# Patient Record
Sex: Female | Born: 1935 | Race: Black or African American | Hispanic: No | Smoking: Never smoker
Health system: Southern US, Community
[De-identification: ages and names within clinical notes are randomized; demographics above are authoritative.]

## PROBLEM LIST (undated history)

## (undated) DIAGNOSIS — F419 Anxiety disorder, unspecified: Secondary | ICD-10-CM

## (undated) DIAGNOSIS — M1990 Unspecified osteoarthritis, unspecified site: Secondary | ICD-10-CM

## (undated) DIAGNOSIS — I10 Essential (primary) hypertension: Secondary | ICD-10-CM

## (undated) DIAGNOSIS — R569 Unspecified convulsions: Secondary | ICD-10-CM

## (undated) DIAGNOSIS — N390 Urinary tract infection, site not specified: Secondary | ICD-10-CM

## (undated) DIAGNOSIS — R339 Retention of urine, unspecified: Secondary | ICD-10-CM

## (undated) DIAGNOSIS — S72059A Unspecified fracture of head of unspecified femur, initial encounter for closed fracture: Secondary | ICD-10-CM

## (undated) DIAGNOSIS — I96 Gangrene, not elsewhere classified: Secondary | ICD-10-CM

## (undated) HISTORY — PX: CATARACT EXTRACTION: SUR2

## (undated) HISTORY — PX: TOTAL HIP ARTHROPLASTY: SHX124

---

## 2001-09-12 ENCOUNTER — Emergency Department (HOSPITAL_COMMUNITY): Admission: EM | Admit: 2001-09-12 | Discharge: 2001-09-12 | Payer: Self-pay | Admitting: Emergency Medicine

## 2001-09-12 ENCOUNTER — Encounter: Payer: Self-pay | Admitting: Emergency Medicine

## 2001-09-13 ENCOUNTER — Encounter: Payer: Self-pay | Admitting: Emergency Medicine

## 2001-09-13 ENCOUNTER — Ambulatory Visit (HOSPITAL_COMMUNITY): Admission: RE | Admit: 2001-09-13 | Discharge: 2001-09-13 | Payer: Self-pay | Admitting: Emergency Medicine

## 2001-09-22 ENCOUNTER — Encounter: Payer: Self-pay | Admitting: Nephrology

## 2001-09-22 ENCOUNTER — Encounter: Admission: RE | Admit: 2001-09-22 | Discharge: 2001-09-22 | Payer: Self-pay | Admitting: Nephrology

## 2002-10-07 ENCOUNTER — Emergency Department (HOSPITAL_COMMUNITY): Admission: EM | Admit: 2002-10-07 | Discharge: 2002-10-07 | Payer: Self-pay

## 2002-12-17 ENCOUNTER — Encounter: Payer: Self-pay | Admitting: Emergency Medicine

## 2002-12-17 ENCOUNTER — Emergency Department (HOSPITAL_COMMUNITY): Admission: EM | Admit: 2002-12-17 | Discharge: 2002-12-17 | Payer: Self-pay | Admitting: Emergency Medicine

## 2003-01-23 ENCOUNTER — Encounter: Admission: RE | Admit: 2003-01-23 | Discharge: 2003-02-07 | Payer: Self-pay | Admitting: Family Medicine

## 2003-02-01 ENCOUNTER — Emergency Department (HOSPITAL_COMMUNITY): Admission: EM | Admit: 2003-02-01 | Discharge: 2003-02-01 | Payer: Self-pay | Admitting: Emergency Medicine

## 2003-02-01 ENCOUNTER — Encounter: Payer: Self-pay | Admitting: Emergency Medicine

## 2003-04-05 ENCOUNTER — Encounter: Payer: Self-pay | Admitting: Emergency Medicine

## 2003-04-05 ENCOUNTER — Emergency Department (HOSPITAL_COMMUNITY): Admission: EM | Admit: 2003-04-05 | Discharge: 2003-04-05 | Payer: Self-pay | Admitting: Emergency Medicine

## 2003-06-08 ENCOUNTER — Encounter: Payer: Self-pay | Admitting: Orthopedic Surgery

## 2003-06-08 ENCOUNTER — Ambulatory Visit (HOSPITAL_COMMUNITY): Admission: RE | Admit: 2003-06-08 | Discharge: 2003-06-08 | Payer: Self-pay | Admitting: Orthopedic Surgery

## 2005-09-28 ENCOUNTER — Emergency Department (HOSPITAL_COMMUNITY): Admission: EM | Admit: 2005-09-28 | Discharge: 2005-09-28 | Payer: Self-pay | Admitting: Emergency Medicine

## 2007-05-10 ENCOUNTER — Emergency Department (HOSPITAL_COMMUNITY): Admission: EM | Admit: 2007-05-10 | Discharge: 2007-05-10 | Payer: Self-pay | Admitting: Emergency Medicine

## 2007-08-23 ENCOUNTER — Emergency Department (HOSPITAL_COMMUNITY): Admission: EM | Admit: 2007-08-23 | Discharge: 2007-08-23 | Payer: Self-pay | Admitting: Emergency Medicine

## 2009-12-31 ENCOUNTER — Inpatient Hospital Stay (HOSPITAL_COMMUNITY): Admission: EM | Admit: 2009-12-31 | Discharge: 2010-01-04 | Payer: Self-pay | Admitting: Emergency Medicine

## 2011-01-25 LAB — POCT I-STAT, CHEM 8
BUN: 7 mg/dL (ref 6–23)
Creatinine, Ser: 0.7 mg/dL (ref 0.4–1.2)
Potassium: 3.2 mEq/L — ABNORMAL LOW (ref 3.5–5.1)

## 2011-01-25 LAB — CBC
HCT: 32.5 % — ABNORMAL LOW (ref 36.0–46.0)
HCT: 35.3 % — ABNORMAL LOW (ref 36.0–46.0)
Hemoglobin: 11 g/dL — ABNORMAL LOW (ref 12.0–15.0)
Hemoglobin: 12.1 g/dL (ref 12.0–15.0)
Hemoglobin: 12.5 g/dL (ref 12.0–15.0)
MCHC: 33.7 g/dL (ref 30.0–36.0)
MCHC: 33.8 g/dL (ref 30.0–36.0)
MCHC: 34.2 g/dL (ref 30.0–36.0)
MCV: 91.9 fL (ref 78.0–100.0)
MCV: 92.3 fL (ref 78.0–100.0)
Platelets: 210 10*3/uL (ref 150–400)
Platelets: 220 K/uL (ref 150–400)
Platelets: 252 10*3/uL (ref 150–400)
RBC: 3.47 MIL/uL — ABNORMAL LOW (ref 3.87–5.11)
RBC: 3.84 MIL/uL — ABNORMAL LOW (ref 3.87–5.11)
RBC: 3.98 MIL/uL (ref 3.87–5.11)
RDW: 13.9 % (ref 11.5–15.5)
RDW: 14 % (ref 11.5–15.5)
WBC: 11.2 10*3/uL — ABNORMAL HIGH (ref 4.0–10.5)
WBC: 12.2 10*3/uL — ABNORMAL HIGH (ref 4.0–10.5)
WBC: 16.2 K/uL — ABNORMAL HIGH (ref 4.0–10.5)

## 2011-01-25 LAB — BASIC METABOLIC PANEL
BUN: 4 mg/dL — ABNORMAL LOW (ref 6–23)
BUN: 7 mg/dL (ref 6–23)
CO2: 28 mEq/L (ref 19–32)
Calcium: 8.4 mg/dL (ref 8.4–10.5)
Calcium: 8.7 mg/dL (ref 8.4–10.5)
Chloride: 100 mEq/L (ref 96–112)
Chloride: 101 mEq/L (ref 96–112)
Creatinine, Ser: 0.59 mg/dL (ref 0.4–1.2)
GFR calc Af Amer: >60 mL/min (ref >60)
GFR calc non Af Amer: >60 mL/min (ref >60)
GFR calc non Af Amer: >60 mL/min (ref >60)
Glucose, Bld: 96 mg/dL (ref 70–99)
Potassium: 3.9 mEq/L (ref 3.5–5.1)
Potassium: 4.1 mEq/L (ref 3.5–5.1)

## 2011-01-25 LAB — DIFFERENTIAL
Basophils Absolute: 0 K/uL (ref 0.0–0.1)
Basophils Absolute: 0.1 10*3/uL (ref 0.0–0.1)
Basophils Absolute: 0.1 10*3/uL (ref 0.0–0.1)
Basophils Absolute: 0.1 K/uL (ref 0.0–0.1)
Basophils Relative: 0 % (ref 0–1)
Basophils Relative: 0 % (ref 0–1)
Basophils Relative: 0 % (ref 0–1)
Eosinophils Absolute: 0 10*3/uL (ref 0.0–0.7)
Eosinophils Absolute: 0 K/uL (ref 0.0–0.7)
Eosinophils Absolute: 0.1 K/uL (ref 0.0–0.7)
Eosinophils Relative: 0 % (ref 0–5)
Eosinophils Relative: 0 % (ref 0–5)
Eosinophils Relative: 0 % (ref 0–5)
Lymphocytes Relative: 13 % (ref 12–46)
Lymphocytes Relative: 14 % (ref 12–46)
Lymphocytes Relative: 21 % (ref 12–46)
Lymphocytes Relative: 4 % — ABNORMAL LOW (ref 12–46)
Lymphs Abs: 0.6 K/uL — ABNORMAL LOW (ref 0.7–4.0)
Lymphs Abs: 2 K/uL (ref 0.7–4.0)
Lymphs Abs: 2.5 10*3/uL (ref 0.7–4.0)
Monocytes Absolute: 1.2 K/uL — ABNORMAL HIGH (ref 0.1–1.0)
Monocytes Absolute: 1.2 K/uL — ABNORMAL HIGH (ref 0.1–1.0)
Monocytes Absolute: 1.4 10*3/uL — ABNORMAL HIGH (ref 0.1–1.0)
Monocytes Relative: 7 % (ref 3–12)
Monocytes Relative: 7 % (ref 3–12)
Monocytes Relative: 8 % (ref 3–12)
Neutro Abs: 12.2 K/uL — ABNORMAL HIGH (ref 1.7–7.7)
Neutro Abs: 14.4 K/uL — ABNORMAL HIGH (ref 1.7–7.7)
Neutro Abs: 8.2 10*3/uL — ABNORMAL HIGH (ref 1.7–7.7)
Neutro Abs: 8.7 10*3/uL — ABNORMAL HIGH (ref 1.7–7.7)
Neutrophils Relative %: 68 % (ref 43–77)
Neutrophils Relative %: 78 % — ABNORMAL HIGH (ref 43–77)
Neutrophils Relative %: 79 % — ABNORMAL HIGH (ref 43–77)
Neutrophils Relative %: 89 % — ABNORMAL HIGH (ref 43–77)

## 2011-01-25 LAB — RETICULOCYTES
RBC.: 3.97 MIL/uL (ref 3.87–5.11)
Retic Count, Absolute: 23.8 K/uL (ref 19.0–186.0)
Retic Ct Pct: 0.6 % (ref 0.4–3.1)

## 2011-01-25 LAB — WOUND CULTURE

## 2011-01-25 LAB — IRON AND TIBC
Iron: 32 ug/dL — ABNORMAL LOW (ref 42–135)
Saturation Ratios: 24 % (ref 20–55)
TIBC: 134 ug/dL — ABNORMAL LOW (ref 250–470)
UIBC: 102 ug/dL

## 2011-01-25 LAB — RAPID STREP SCREEN (MED CTR MEBANE ONLY): Streptococcus, Group A Screen (Direct): NEGATIVE

## 2011-01-25 LAB — BASIC METABOLIC PANEL WITH GFR
BUN: 6 mg/dL (ref 6–23)
CO2: 33 meq/L — ABNORMAL HIGH (ref 19–32)
Calcium: 8 mg/dL — ABNORMAL LOW (ref 8.4–10.5)
Chloride: 97 meq/L (ref 96–112)
Creatinine, Ser: 0.55 mg/dL (ref 0.4–1.2)
GFR calc non Af Amer: >60 mL/min
Glucose, Bld: 100 mg/dL — ABNORMAL HIGH (ref 70–99)
Potassium: 2.9 meq/L — ABNORMAL LOW (ref 3.5–5.1)
Sodium: 134 meq/L — ABNORMAL LOW (ref 135–145)

## 2011-01-25 LAB — MAGNESIUM
Magnesium: 1.8 mg/dL (ref 1.5–2.5)
Magnesium: 2.4 mg/dL (ref 1.5–2.5)

## 2011-01-25 LAB — FOLATE: Folate: 17.6 ng/mL

## 2011-01-25 LAB — VITAMIN B12: Vitamin B-12: 1611 pg/mL — ABNORMAL HIGH (ref 211–911)

## 2011-01-25 LAB — FERRITIN: Ferritin: 436 ng/mL — ABNORMAL HIGH (ref 10–291)

## 2011-01-25 LAB — MRSA PCR SCREENING: MRSA by PCR: NEGATIVE

## 2011-08-12 LAB — BASIC METABOLIC PANEL
BUN: 4 — ABNORMAL LOW
Chloride: 95 — ABNORMAL LOW
Glucose, Bld: 92
Potassium: 3.8
Sodium: 137

## 2011-08-12 LAB — URINALYSIS, ROUTINE W REFLEX MICROSCOPIC
Nitrite: NEGATIVE
Specific Gravity, Urine: 1.006
pH: 7.5

## 2011-08-12 LAB — URINE MICROSCOPIC-ADD ON

## 2011-08-12 LAB — URINE CULTURE

## 2011-08-18 LAB — DIFFERENTIAL
Basophils Absolute: 0
Basophils Relative: 1
Eosinophils Absolute: 0
Eosinophils Relative: 0
Monocytes Absolute: 0.3

## 2011-08-18 LAB — COMPREHENSIVE METABOLIC PANEL
ALT: 19
AST: 41 — ABNORMAL HIGH
Albumin: 3.3 — ABNORMAL LOW
Alkaline Phosphatase: 89
CO2: 30
Chloride: 100
Creatinine, Ser: 0.56
GFR calc Af Amer: >60
GFR calc non Af Amer: >60
Potassium: 4.5
Total Bilirubin: 1

## 2011-08-18 LAB — URINE MICROSCOPIC-ADD ON

## 2011-08-18 LAB — URINALYSIS, ROUTINE W REFLEX MICROSCOPIC
Bilirubin Urine: NEGATIVE
Glucose, UA: NEGATIVE
Ketones, ur: NEGATIVE
Leukocytes, UA: NEGATIVE
Specific Gravity, Urine: 1.008
pH: 7.5

## 2011-08-18 LAB — CBC
MCV: 89.8
RBC: 3.69 — ABNORMAL LOW
WBC: 4.4

## 2011-08-18 LAB — PHENYTOIN LEVEL, TOTAL: Phenytoin Lvl: <2.5 — ABNORMAL LOW

## 2014-04-06 DIAGNOSIS — Z5181 Encounter for therapeutic drug level monitoring: Secondary | ICD-10-CM | POA: Diagnosis not present

## 2014-04-06 DIAGNOSIS — I10 Essential (primary) hypertension: Secondary | ICD-10-CM | POA: Diagnosis not present

## 2014-07-27 DIAGNOSIS — I10 Essential (primary) hypertension: Secondary | ICD-10-CM | POA: Diagnosis not present

## 2014-07-27 DIAGNOSIS — Z8669 Personal history of other diseases of the nervous system and sense organs: Secondary | ICD-10-CM | POA: Diagnosis not present

## 2014-07-27 DIAGNOSIS — G309 Alzheimer's disease, unspecified: Secondary | ICD-10-CM | POA: Diagnosis not present

## 2014-07-27 DIAGNOSIS — F0280 Dementia in other diseases classified elsewhere without behavioral disturbance: Secondary | ICD-10-CM | POA: Diagnosis not present

## 2014-10-05 DIAGNOSIS — E871 Hypo-osmolality and hyponatremia: Secondary | ICD-10-CM | POA: Diagnosis not present

## 2014-11-06 DIAGNOSIS — F419 Anxiety disorder, unspecified: Secondary | ICD-10-CM | POA: Diagnosis not present

## 2014-11-06 DIAGNOSIS — K219 Gastro-esophageal reflux disease without esophagitis: Secondary | ICD-10-CM | POA: Diagnosis not present

## 2014-11-06 DIAGNOSIS — I10 Essential (primary) hypertension: Secondary | ICD-10-CM | POA: Diagnosis not present

## 2014-11-06 DIAGNOSIS — M1990 Unspecified osteoarthritis, unspecified site: Secondary | ICD-10-CM | POA: Diagnosis not present

## 2014-11-06 DIAGNOSIS — G40909 Epilepsy, unspecified, not intractable, without status epilepticus: Secondary | ICD-10-CM | POA: Diagnosis not present

## 2015-02-04 DIAGNOSIS — G40909 Epilepsy, unspecified, not intractable, without status epilepticus: Secondary | ICD-10-CM | POA: Diagnosis not present

## 2015-02-04 DIAGNOSIS — I10 Essential (primary) hypertension: Secondary | ICD-10-CM | POA: Diagnosis not present

## 2015-02-04 DIAGNOSIS — N63 Unspecified lump in breast: Secondary | ICD-10-CM | POA: Diagnosis not present

## 2015-02-04 DIAGNOSIS — R413 Other amnesia: Secondary | ICD-10-CM | POA: Diagnosis not present

## 2015-02-04 DIAGNOSIS — F419 Anxiety disorder, unspecified: Secondary | ICD-10-CM | POA: Diagnosis not present

## 2015-02-04 DIAGNOSIS — R2689 Other abnormalities of gait and mobility: Secondary | ICD-10-CM | POA: Diagnosis not present

## 2015-02-08 ENCOUNTER — Other Ambulatory Visit: Payer: Self-pay | Admitting: Family Medicine

## 2015-02-08 DIAGNOSIS — N63 Unspecified lump in unspecified breast: Secondary | ICD-10-CM

## 2015-05-28 DIAGNOSIS — F419 Anxiety disorder, unspecified: Secondary | ICD-10-CM | POA: Diagnosis not present

## 2015-05-28 DIAGNOSIS — M1990 Unspecified osteoarthritis, unspecified site: Secondary | ICD-10-CM | POA: Diagnosis not present

## 2015-05-28 DIAGNOSIS — I10 Essential (primary) hypertension: Secondary | ICD-10-CM | POA: Diagnosis not present

## 2015-05-28 DIAGNOSIS — K219 Gastro-esophageal reflux disease without esophagitis: Secondary | ICD-10-CM | POA: Diagnosis not present

## 2015-05-28 DIAGNOSIS — G40909 Epilepsy, unspecified, not intractable, without status epilepticus: Secondary | ICD-10-CM | POA: Diagnosis not present

## 2015-10-23 DIAGNOSIS — F411 Generalized anxiety disorder: Secondary | ICD-10-CM | POA: Diagnosis not present

## 2015-10-23 DIAGNOSIS — L03019 Cellulitis of unspecified finger: Secondary | ICD-10-CM | POA: Diagnosis not present

## 2015-10-23 DIAGNOSIS — H6120 Impacted cerumen, unspecified ear: Secondary | ICD-10-CM | POA: Diagnosis not present

## 2015-10-23 DIAGNOSIS — L603 Nail dystrophy: Secondary | ICD-10-CM | POA: Diagnosis not present

## 2015-11-06 ENCOUNTER — Ambulatory Visit
Admission: RE | Admit: 2015-11-06 | Discharge: 2015-11-06 | Disposition: A | Discharge status: Home / Self Care | Payer: Medicare Other | Source: Ambulatory Visit | Attending: Physician Assistant | Admitting: Physician Assistant

## 2015-11-06 ENCOUNTER — Other Ambulatory Visit: Payer: Self-pay | Admitting: Physician Assistant

## 2015-11-06 DIAGNOSIS — W19XXXA Unspecified fall, initial encounter: Secondary | ICD-10-CM

## 2015-11-06 DIAGNOSIS — R0781 Pleurodynia: Secondary | ICD-10-CM | POA: Diagnosis not present

## 2016-03-29 DIAGNOSIS — L03012 Cellulitis of left finger: Secondary | ICD-10-CM | POA: Diagnosis not present

## 2016-04-09 DIAGNOSIS — M79645 Pain in left finger(s): Secondary | ICD-10-CM | POA: Diagnosis not present

## 2016-04-09 DIAGNOSIS — Z131 Encounter for screening for diabetes mellitus: Secondary | ICD-10-CM | POA: Diagnosis not present

## 2016-04-09 DIAGNOSIS — L03012 Cellulitis of left finger: Secondary | ICD-10-CM | POA: Diagnosis not present

## 2016-09-18 DIAGNOSIS — I10 Essential (primary) hypertension: Secondary | ICD-10-CM | POA: Diagnosis not present

## 2016-09-18 DIAGNOSIS — K219 Gastro-esophageal reflux disease without esophagitis: Secondary | ICD-10-CM | POA: Diagnosis not present

## 2016-09-18 DIAGNOSIS — M79675 Pain in left toe(s): Secondary | ICD-10-CM | POA: Diagnosis not present

## 2016-09-18 DIAGNOSIS — G40909 Epilepsy, unspecified, not intractable, without status epilepticus: Secondary | ICD-10-CM | POA: Diagnosis not present

## 2016-10-14 ENCOUNTER — Emergency Department (HOSPITAL_BASED_OUTPATIENT_CLINIC_OR_DEPARTMENT_OTHER): Payer: Medicare Other

## 2016-10-14 ENCOUNTER — Emergency Department (HOSPITAL_BASED_OUTPATIENT_CLINIC_OR_DEPARTMENT_OTHER)
Admission: EM | Admit: 2016-10-14 | Discharge: 2016-10-14 | Disposition: A | Discharge status: Home / Self Care | Payer: Medicare Other | Attending: Emergency Medicine | Admitting: Emergency Medicine

## 2016-10-14 ENCOUNTER — Encounter (HOSPITAL_BASED_OUTPATIENT_CLINIC_OR_DEPARTMENT_OTHER): Payer: Self-pay | Admitting: Emergency Medicine

## 2016-10-14 DIAGNOSIS — Y929 Unspecified place or not applicable: Secondary | ICD-10-CM | POA: Diagnosis not present

## 2016-10-14 DIAGNOSIS — W1839XA Other fall on same level, initial encounter: Secondary | ICD-10-CM | POA: Insufficient documentation

## 2016-10-14 DIAGNOSIS — M25552 Pain in left hip: Secondary | ICD-10-CM | POA: Diagnosis not present

## 2016-10-14 DIAGNOSIS — Y939 Activity, unspecified: Secondary | ICD-10-CM | POA: Diagnosis not present

## 2016-10-14 DIAGNOSIS — S32502A Unspecified fracture of left pubis, initial encounter for closed fracture: Secondary | ICD-10-CM | POA: Diagnosis not present

## 2016-10-14 DIAGNOSIS — S3993XA Unspecified injury of pelvis, initial encounter: Secondary | ICD-10-CM | POA: Diagnosis present

## 2016-10-14 DIAGNOSIS — Y999 Unspecified external cause status: Secondary | ICD-10-CM | POA: Insufficient documentation

## 2016-10-14 DIAGNOSIS — S32592A Other specified fracture of left pubis, initial encounter for closed fracture: Secondary | ICD-10-CM | POA: Insufficient documentation

## 2016-10-14 DIAGNOSIS — Y92009 Unspecified place in unspecified non-institutional (private) residence as the place of occurrence of the external cause: Secondary | ICD-10-CM

## 2016-10-14 DIAGNOSIS — I10 Essential (primary) hypertension: Secondary | ICD-10-CM | POA: Insufficient documentation

## 2016-10-14 DIAGNOSIS — Z79899 Other long term (current) drug therapy: Secondary | ICD-10-CM | POA: Diagnosis not present

## 2016-10-14 DIAGNOSIS — W19XXXA Unspecified fall, initial encounter: Secondary | ICD-10-CM

## 2016-10-14 HISTORY — DX: Unspecified osteoarthritis, unspecified site: M19.90

## 2016-10-14 HISTORY — DX: Unspecified convulsions: R56.9

## 2016-10-14 HISTORY — DX: Essential (primary) hypertension: I10

## 2016-10-14 MED ORDER — ACETAMINOPHEN 500 MG PO TABS
1000.0000 mg | ORAL_TABLET | Freq: Once | ORAL | Status: AC
  Administered 2016-10-14: 1000 mg via ORAL
  Filled 2016-10-14: qty 2

## 2016-10-14 MED ORDER — OXYCODONE HCL 5 MG PO TABS
5.0000 mg | ORAL_TABLET | Freq: Once | ORAL | Status: AC
Start: 2016-10-14 — End: 2016-10-14
  Administered 2016-10-14: 5 mg via ORAL
  Filled 2016-10-14: qty 1

## 2016-10-14 NOTE — Discharge Instructions (Addendum)
Follow-up with your podiatrist in your palliative care physician. They may want to do a vascular study of your leg if your symptoms persist. The podiatrist may want start you on antifungal medicine.  Weight bearing as tolerated.

## 2016-10-14 NOTE — ED Notes (Signed)
Family attempting to obtain medication list.

## 2016-10-14 NOTE — ED Triage Notes (Signed)
Nurse first-pt stood with family member assist from back seat of car-pivoted to w/c-taken to ED WR in w/c-NAD

## 2016-10-14 NOTE — ED Triage Notes (Addendum)
Patient accompanied by her daughter Avon Gully), and her niece. Hospice Palliative Care is involved in patient care. Patient family was requested to bring patient in for imaging to the left hip, leg due to a fall on Friday. Patient was walking with a walker when she fell when noone was home. Patient then crawled to another part of the house. Family reports the patient has bruising scattered to her body. Family reports patient is not oriented at her baseline, has been altered for @ 2 months after starting pain medications.

## 2016-10-14 NOTE — ED Provider Notes (Signed)
[facility] Provider Note   CSN: [id number] Arrival date & time: 10/14/16  1619     History   Chief Complaint Chief Complaint  Patient presents with  . Fall    HPI Kayla Collier is a 82 y.o. female.  82 yo F in hospice and palliative care had a fall couple days ago. Sounds mechanical in nature. Has been having more and more falls secondary to was been diagnosed as peripheral neuropathy. Patient has been complaining of pain to bilateral feet and now having difficulty stepping on them due to pain. Has recently been started on gabapentin. Family is concerned that maybe she broke her hip she's been having pain to her left lateral leg. Denies any other injury. Denies back pain chest pain abdominal pain pelvic pain. Has been able to ambulate but with pain.   The history is provided by the patient.  Fall  This is a new problem. The current episode started 2 days ago. The problem occurs constantly. The problem has not changed since onset.Pertinent negatives include no chest pain, no headaches and no shortness of breath. Nothing aggravates the symptoms. Nothing relieves the symptoms. She has tried nothing for the symptoms. The treatment provided no relief.    Past Medical History:  Diagnosis Date  . Arthritis    RA  . Hypertension   . Seizures (HCC)     There are no active problems to display for this patient.   Past Surgical History:  Procedure Laterality Date  . CATARACT EXTRACTION    . TOTAL HIP ARTHROPLASTY      OB History    No data available       Home Medications    Prior to Admission medications   Medication Sig Start Date End Date Taking? Authorizing Provider  ALPRAZolam Prudy Feeler) 0.5 MG tablet Take 0.5 mg by mouth at bedtime as needed for anxiety.   Yes Historical Provider, MD  amLODipine (NORVASC) 5 MG tablet Take 5 mg by mouth daily.   Yes Historical Provider, MD  atenolol (TENORMIN) 50 MG tablet Take 50 mg by mouth daily.   Yes Historical  Provider, MD  cefadroxil (DURICEF) 500 MG capsule Take 500 mg by mouth 2 (two) times daily.   Yes Historical Provider, MD  dipyridamole (PERSANTINE) 75 MG tablet Take 75 mg by mouth 4 (four) times daily.   Yes Historical Provider, MD  gabapentin (NEURONTIN) 100 MG capsule Take 100 mg by mouth 3 (three) times daily.   Yes Historical Provider, MD  oxyCODONE-acetaminophen (PERCOCET) 10-325 MG tablet Take 1 tablet by mouth every 4 (four) hours as needed for pain.   Yes Historical Provider, MD  phenytoin (DILANTIN) 100 MG ER capsule Take by mouth 3 (three) times daily.   Yes Historical Provider, MD  phenytoin (DILANTIN) 30 MG ER capsule Take by mouth 3 (three) times daily.   Yes Historical Provider, MD  potassium chloride (MICRO-K) 10 MEQ CR capsule Take 10 mEq by mouth 2 (two) times daily.   Yes Historical Provider, MD  traMADol (ULTRAM) 50 MG tablet Take by mouth every 6 (six) hours as needed.   Yes Historical Provider, MD  valsartan-hydrochlorothiazide (DIOVAN-HCT) 160-12.5 MG tablet Take 1 tablet by mouth daily.   Yes Historical Provider, MD    Family History History reviewed. No pertinent family history.  Social History Social History  Substance Use Topics  . Smoking status: Never Smoker  . Smokeless tobacco: Never Used  . Alcohol use No     Allergies  Vicodin [hydrocodone-acetaminophen]   Review of Systems Review of Systems  Constitutional: Negative for chills and fever.  HENT: Negative for congestion and rhinorrhea.   Eyes: Negative for redness and visual disturbance.  Respiratory: Negative for shortness of breath and wheezing.   Cardiovascular: Negative for chest pain and palpitations.  Gastrointestinal: Negative for nausea and vomiting.  Genitourinary: Negative for dysuria and urgency.  Musculoskeletal: Positive for arthralgias, gait problem and myalgias.  Skin: Negative for pallor and wound.  Neurological: Negative for dizziness and headaches.     Physical  Exam Updated Vital Signs BP 191/74 (BP Location: Left Arm)   Pulse 115   Temp 98.9 F (37.2 C)   Resp 16   Ht 4\' 9"  (1.448 m) Comment: pt daughter stated  Wt 94 lb (42.6 kg) Comment: per daughter at bedside  SpO2 96%   BMI 20.34 kg/m   Physical Exam  Constitutional: She is oriented to person, place, and time. She appears well-developed. No distress.  Cachectic   HENT:  Head: Normocephalic and atraumatic.  Eyes: EOM are normal. Pupils are equal, round, and reactive to light.  Neck: Normal range of motion. Neck supple.  Cardiovascular: Normal rate and regular rhythm.  Exam reveals no gallop and no friction rub.   No murmur heard. Pulmonary/Chest: Effort normal. She has no wheezes. She has no rales.  Abdominal: Soft. She exhibits no distension. There is no tenderness.  Musculoskeletal: She exhibits tenderness (Tender palpation about the leftgreater trochanter. Intact pulses bilaterally. Skin changes consistent with fungal infections of bilateral toes diffusely.). She exhibits no edema.  Neurological: She is alert and oriented to person, place, and time.  Skin: Skin is warm and dry. She is not diaphoretic.  Psychiatric: She has a normal mood and affect. Her behavior is normal.  Nursing note and vitals reviewed.    ED Treatments / Results  Labs (all labs ordered are listed, but only abnormal results are displayed) Labs Reviewed - No data to display  EKG  EKG Interpretation None       Radiology Ct Pelvis Wo Contrast  Result Date: 10/14/2016 CLINICAL DATA:  Fall onto floor today. Left hip pain and difficulty walking. Previous internal fixation of left hip fracture. EXAM: CT PELVIS WITHOUT CONTRAST TECHNIQUE: Multidetector CT imaging of the pelvis was performed following the standard protocol without intravenous contrast. COMPARISON:  None. FINDINGS: Urinary Tract:  Unremarkable urinary bladder. Bowel:  Unremarkable. Vascular/Lymphatic: Aortoiliac atherosclerotic  calcification. No acute findings. Reproductive:  2 cm calcified uterine fibroid. Other:  None Musculoskeletal: Compression screw -plate fixation device seen in the left hip. Old intertrochanteric left hip fracture deformity seen. No acute fracture or dislocation of the left hip. Acute nondisplaced fracture of the left inferior pubic ramus is seen. IMPRESSION: Acute nondisplaced fracture of left inferior pubic ramus. No evidence of acute left hip fracture or dislocation. Old left hip fracture deformity status post internal fixation. Incidentally noted 2 cm calcified uterine fibroid. Electronically Signed   By: Myles Rosenthal M.D.   On: 10/14/2016 18:32   Dg Hip Unilat W Or Wo Pelvis 2-3 Views Left  Result Date: 10/14/2016 CLINICAL DATA:  Fall approximately 1 week ago with left hip pain EXAM: DG HIP (WITH OR WITHOUT PELVIS) 2-3V LEFT COMPARISON:  None. FINDINGS: Patient is status post intramedullary rod and multiple screw fixation of proximal left femur. Hardware appears intact. No acute fracture or dislocation is visualized on the left. Questionable linear lucency on one view of the right hip within the greater trochanter  of the right femur. Pubic symphysis appears intact. Extensive vascular calcifications within the proximal thighs. IMPRESSION: 1. Postsurgical changes of the proximal left femur. Hardware appears intact. No acute fracture or malalignment on the left. 2. Questionable cortical lucencies greater trochanter of the right femur, correlate clinically for point tenderness. Dedicated right hip radiographs may be obtained as indicated. Electronically Signed   By: Jasmine Pang M.D.   On: 10/14/2016 17:24    Procedures Procedures (including critical care time)  Medications Ordered in ED Medications  oxyCODONE (Oxy IR/ROXICODONE) immediate release tablet 5 mg (5 mg Oral Given 10/14/16 1700)  acetaminophen (TYLENOL) tablet 1,000 mg (1,000 mg Oral Given 10/14/16 1700)     Initial Impression /  Assessment and Plan / ED Course  I have reviewed the triage vital signs and the nursing notes.  Pertinent labs & imaging results that were available during my care of the patient were reviewed by me and considered in my medical decision making (see chart for details).  Clinical Course     82 yo F With a chief complaint of left lateral leg pain. I suspect maybe this is a bruise. Will obtain an x-ray to evaluate for fracture. Xray negative. Will discussing the results with the family visited there is sent to rule out a hairline fracture. Discussed evaluate imaging was with a CT scan. CT scan of the pelvis was concerning for a inferior pubic rami fracture. Discussed with the family will do weightbearing as tolerated follow with orthopedics in the office.    I have discussed the diagnosis/risks/treatment options with the patient and family and believe the pt to be eligible for discharge home to follow-up with Ortho. We also discussed returning to the ED immediately if new or worsening sx occur. We discussed the sx which are most concerning (e.g., sudden worsening pain, fever, inability to tolerate by mouth) that necessitate immediate return. Medications administered to the patient during their visit and any new prescriptions provided to the patient are listed below.  Medications given during this visit Medications  oxyCODONE (Oxy IR/ROXICODONE) immediate release tablet 5 mg (5 mg Oral Given 10/14/16 1700)  acetaminophen (TYLENOL) tablet 1,000 mg (1,000 mg Oral Given 10/14/16 1700)     The patient appears reasonably screen and/or stabilized for discharge and I doubt any other medical condition or other [facility] requiring further screening, evaluation, or treatment in the ED at this time prior to discharge.    Final Clinical Impressions(s) / ED Diagnoses   Final diagnoses:  Fall in home, initial encounter  Other closed fracture of left pubis, initial encounter [facility])    New Prescriptions Discharge  Medication List as of 10/14/2016  6:42 PM       Melene Plan, DO 10/14/16 2335

## 2016-10-28 DIAGNOSIS — R531 Weakness: Secondary | ICD-10-CM | POA: Diagnosis not present

## 2017-02-19 ENCOUNTER — Encounter: Payer: Self-pay | Admitting: *Deleted

## 2017-02-19 ENCOUNTER — Inpatient Hospital Stay
Admission: EM | Admit: 2017-02-19 | Discharge: 2017-02-23 | DRG: 300 | Disposition: A | Discharge status: Skilled Nursing Facility | Payer: Medicare Other | Attending: Internal Medicine | Admitting: Internal Medicine

## 2017-02-19 ENCOUNTER — Emergency Department: Payer: Medicare Other

## 2017-02-19 DIAGNOSIS — R278 Other lack of coordination: Secondary | ICD-10-CM | POA: Diagnosis not present

## 2017-02-19 DIAGNOSIS — Z7982 Long term (current) use of aspirin: Secondary | ICD-10-CM

## 2017-02-19 DIAGNOSIS — Z79899 Other long term (current) drug therapy: Secondary | ICD-10-CM | POA: Diagnosis not present

## 2017-02-19 DIAGNOSIS — Z885 Allergy status to narcotic agent status: Secondary | ICD-10-CM | POA: Diagnosis not present

## 2017-02-19 DIAGNOSIS — M62262 Nontraumatic ischemic infarction of muscle, left lower leg: Secondary | ICD-10-CM | POA: Diagnosis not present

## 2017-02-19 DIAGNOSIS — R6889 Other general symptoms and signs: Secondary | ICD-10-CM | POA: Diagnosis not present

## 2017-02-19 DIAGNOSIS — R569 Unspecified convulsions: Secondary | ICD-10-CM

## 2017-02-19 DIAGNOSIS — Z96649 Presence of unspecified artificial hip joint: Secondary | ICD-10-CM | POA: Diagnosis present

## 2017-02-19 DIAGNOSIS — F419 Anxiety disorder, unspecified: Secondary | ICD-10-CM | POA: Diagnosis present

## 2017-02-19 DIAGNOSIS — I96 Gangrene, not elsewhere classified: Principal | ICD-10-CM | POA: Diagnosis present

## 2017-02-19 DIAGNOSIS — I998 Other disorder of circulatory system: Secondary | ICD-10-CM

## 2017-02-19 DIAGNOSIS — M1990 Unspecified osteoarthritis, unspecified site: Secondary | ICD-10-CM | POA: Diagnosis present

## 2017-02-19 DIAGNOSIS — L089 Local infection of the skin and subcutaneous tissue, unspecified: Secondary | ICD-10-CM | POA: Diagnosis present

## 2017-02-19 DIAGNOSIS — Z66 Do not resuscitate: Secondary | ICD-10-CM | POA: Diagnosis present

## 2017-02-19 DIAGNOSIS — E876 Hypokalemia: Secondary | ICD-10-CM | POA: Diagnosis present

## 2017-02-19 DIAGNOSIS — R2689 Other abnormalities of gait and mobility: Secondary | ICD-10-CM | POA: Diagnosis not present

## 2017-02-19 DIAGNOSIS — M6281 Muscle weakness (generalized): Secondary | ICD-10-CM | POA: Diagnosis not present

## 2017-02-19 DIAGNOSIS — I10 Essential (primary) hypertension: Secondary | ICD-10-CM | POA: Diagnosis present

## 2017-02-19 DIAGNOSIS — Z9849 Cataract extraction status, unspecified eye: Secondary | ICD-10-CM | POA: Diagnosis not present

## 2017-02-19 DIAGNOSIS — E871 Hypo-osmolality and hyponatremia: Secondary | ICD-10-CM | POA: Diagnosis present

## 2017-02-19 DIAGNOSIS — Z7401 Bed confinement status: Secondary | ICD-10-CM | POA: Diagnosis not present

## 2017-02-19 DIAGNOSIS — M7989 Other specified soft tissue disorders: Secondary | ICD-10-CM | POA: Diagnosis not present

## 2017-02-19 HISTORY — DX: Anxiety disorder, unspecified: F41.9

## 2017-02-19 LAB — CBC
HCT: 31.6 % — ABNORMAL LOW (ref 35.0–47.0)
Hemoglobin: 10.7 g/dL — ABNORMAL LOW (ref 12.0–16.0)
MCH: 30.8 pg (ref 26.0–34.0)
MCHC: 33.9 g/dL (ref 32.0–36.0)
MCV: 90.8 fL (ref 80.0–100.0)
Platelets: 306 10*3/uL (ref 150–440)
RBC: 3.48 MIL/uL — ABNORMAL LOW (ref 3.80–5.20)
RDW: 14.4 % (ref 11.5–14.5)
WBC: 7.2 10*3/uL (ref 3.6–11.0)

## 2017-02-19 LAB — BASIC METABOLIC PANEL
Anion gap: 9 (ref 5–15)
BUN: 17 mg/dL (ref 6–20)
CO2: 29 mmol/L (ref 22–32)
Calcium: 8.8 mg/dL — ABNORMAL LOW (ref 8.9–10.3)
Chloride: 94 mmol/L — ABNORMAL LOW (ref 101–111)
Creatinine, Ser: 0.46 mg/dL (ref 0.44–1.00)
GFR calc Af Amer: >60 mL/min (ref >60)
GFR calc non Af Amer: >60 mL/min (ref >60)
Glucose, Bld: 120 mg/dL — ABNORMAL HIGH (ref 65–99)
Potassium: 4.2 mmol/L (ref 3.5–5.1)
Sodium: 132 mmol/L — ABNORMAL LOW (ref 135–145)

## 2017-02-19 MED ORDER — MORPHINE SULFATE (PF) 4 MG/ML IV SOLN
4.0000 mg | Freq: Once | INTRAVENOUS | Status: AC
  Administered 2017-02-19: 4 mg via INTRAVENOUS

## 2017-02-19 MED ORDER — OXYCODONE HCL 5 MG PO TABS
ORAL_TABLET | ORAL | Status: AC
  Administered 2017-02-19: 5 mg via ORAL
  Filled 2017-02-19: qty 1

## 2017-02-19 MED ORDER — MORPHINE SULFATE (PF) 4 MG/ML IV SOLN
4.0000 mg | Freq: Once | INTRAVENOUS | Status: DC
  Filled 2017-02-19: qty 1

## 2017-02-19 MED ORDER — VANCOMYCIN HCL IN DEXTROSE 1-5 GM/200ML-% IV SOLN
1000.0000 mg | Freq: Once | INTRAVENOUS | Status: AC
  Administered 2017-02-19: 1000 mg via INTRAVENOUS
  Filled 2017-02-19: qty 200

## 2017-02-19 MED ORDER — OXYCODONE HCL 5 MG PO TABS
5.0000 mg | ORAL_TABLET | ORAL | Status: DC | PRN
  Administered 2017-02-19 – 2017-02-20 (×4): 5 mg via ORAL
  Filled 2017-02-19 (×3): qty 1

## 2017-02-19 MED ORDER — PIPERACILLIN-TAZOBACTAM 3.375 G IVPB 30 MIN
3.3750 g | Freq: Once | INTRAVENOUS | Status: AC
  Administered 2017-02-19: 3.375 g via INTRAVENOUS
  Filled 2017-02-19: qty 50

## 2017-02-19 NOTE — ED Notes (Signed)
ED Provider consulting with family outside of the patient's room.

## 2017-02-19 NOTE — ED Triage Notes (Signed)
Pt to triage via wheelchair.  Pt has left great toe pain.  Pt has no feeling in toe and has had drainage around toe.  Pt saw podiatrist yesterday.  Pt alert.

## 2017-02-19 NOTE — ED Provider Notes (Signed)
[facility] Emergency Department Provider Note  ____________________________________________   I have reviewed the triage vital signs and the nursing notes.   HISTORY  Chief Complaint Toe Pain    HPI Kayla Collier is a 83 y.o. female  Who presents to day complaining of increased pain in her toes on the left. Pt has had pain in that foot for months, since perhaps November or earlier. She is seeing hospice for pain control she had had no injury.  Pain medications are not covering her pain. She does not want an operation. She has had no fever. She is here because the pain is much worse over the last week and she has had some pus from her big toe. Was sent here for further evaluation    Pain is constant, sharp, to the entire foot, nothing makes better nothing makes worse no radiation. Also pain to the l foot.   Past Medical History:  Diagnosis Date  . Anxiety   . Arthritis    RA  . Hypertension   . Seizures [facility])     Patient Active Problem List   Diagnosis Date Noted  . Gangrene of foot (HCC) 02/19/2017  . HTN (hypertension) 02/19/2017  . Anxiety 02/19/2017  . Seizures (HCC) 02/19/2017    Past Surgical History:  Procedure Laterality Date  . CATARACT EXTRACTION    . TOTAL HIP ARTHROPLASTY      Prior to Admission medications   Medication Sig Start Date End Date Taking? Authorizing Provider  acetaminophen (TYLENOL) 500 MG tablet Take 1,000 mg by mouth 2 (two) times daily.   Yes Historical Provider, MD  amLODipine (NORVASC) 5 MG tablet Take 5 mg by mouth daily.   Yes Historical Provider, MD  aspirin EC 81 MG tablet Take 81 mg by mouth daily.   Yes Historical Provider, MD  atenolol (TENORMIN) 50 MG tablet Take 50 mg by mouth daily.   Yes Historical Provider, MD  cholecalciferol (VITAMIN D) 1000 units tablet Take 1,000 Units by mouth daily.   Yes Historical Provider, MD  gabapentin (NEURONTIN) 100 MG capsule Take 100 mg by mouth 3 (three) times daily.    Yes Historical Provider, MD  Misc Natural Products (TART CHERRY ADVANCED) CAPS Take 1 capsule by mouth daily.   Yes Historical Provider, MD  phenytoin (DILANTIN) 100 MG ER capsule Take 100 mg by mouth at bedtime.    Yes Historical Provider, MD  phenytoin (DILANTIN) 30 MG ER capsule Take 30 mg by mouth 3 (three) times a week. Take at bedtime with 100mg  on Monday, Wednesday, and Friday   Yes Historical Provider, MD  potassium chloride (MICRO-K) 10 MEQ CR capsule Take 10 mEq by mouth daily.    Yes Historical Provider, MD  traMADol (ULTRAM) 50 MG tablet Take 50 mg by mouth every 8 (eight) hours as needed for moderate pain.    Yes Historical Provider, MD  vitamin B-12 (CYANOCOBALAMIN) 1000 MCG tablet Take 1,000 mcg by mouth daily.   Yes Historical Provider, MD  vitamin E (VITAMIN E) 400 UNIT capsule Take 400 Units by mouth daily.   Yes Historical Provider, MD    Allergies Vicodin [hydrocodone-acetaminophen]  No family history on file.  Social History Social History  Substance Use Topics  . Smoking status: Never Smoker  . Smokeless tobacco: Never Used  . Alcohol use No    Review of Systems Constitutional: No fever/chills Eyes: No visual changes. ENT: No sore throat. No stiff neck no neck pain Cardiovascular: Denies chest pain.  Respiratory: Denies shortness of breath. Gastrointestinal:   no vomiting.  No diarrhea.  No constipation. Genitourinary: Negative for dysuria. Musculoskeletal: Negative lower extremity swelling Skin: Negative for rash. Neurological: Negative for severe headaches, focal weakness or numbness. 10-point ROS otherwise negative.  ____________________________________________   PHYSICAL EXAM:  VITAL SIGNS: ED Triage Vitals  Enc Vitals Group     BP 02/19/17 1856 (!) 183/48     Pulse Rate 02/19/17 1856 71     Resp 02/19/17 1856 20     Temp 02/19/17 1856 99.1 F (37.3 C)     Temp Source 02/19/17 1856 Oral     SpO2 02/19/17 1856 98 %     Weight 02/19/17 1853 94  lb (42.6 kg)     Height 02/19/17 1853 5' (1.524 m)     Head Circumference --      Peak Flow --      Pain Score 02/19/17 1852 10     Pain Loc --      Pain Edu? --      Excl. in GC? --     Constitutional: Alert and oriented to name and place unsure of date. At baseline per family  She is anxious that I will 'take her foot.' . nontoxic Mouth/Throat: Mucous membranes are moist.  Oropharynx non-erythematous. Cardiovascular: Normal rate, regular rhythm. Grossly normal heart sounds.  Good peripheral circulation. Respiratory: Normal respiratory effort.  No retractions. Lungs CTAB. Abdominal: Soft and nontender. No distention. No guarding no rebound Musculoskeletal: No lower extremity tenderness, no upper extremity tenderness. No joint effusions, no DVT signs pt with both feet warm but no pulses palpated either dp or pt with doppler bilaterally.  There is tenderness to both feet. There is positive pulses in both popliteal regions. There is swelling to the great toe on the L with some slight discharge. There is no gas or crepitous.  Neurologic:  Normal speech and language. No gross focal neurologic deficits are appreciated.  Skin:  Skin is warm, dry and intact. See above Psychiatric: Mood and affect are normal. Speech and behavior are normal.  ____________________________________________   LABS (all labs ordered are listed, but only abnormal results are displayed)  Labs Reviewed  BASIC METABOLIC PANEL - Abnormal; Notable for the following:       Result Value   Sodium 132 (*)    Chloride 94 (*)    Glucose, Bld 120 (*)    Calcium 8.8 (*)    All other components within normal limits  CBC - Abnormal; Notable for the following:    RBC 3.48 (*)    Hemoglobin 10.7 (*)    HCT 31.6 (*)    All other components within normal limits   ____________________________________________  EKG  I personally interpreted any EKGs ordered by me or  triage  ____________________________________________  RADIOLOGY  I reviewed any imaging ordered by me or triage that were performed during my shift and, if possible, patient and/or family made aware of any abnormal findings. ____________________________________________   PROCEDURES  Procedure(s) performed: None  Procedures  Critical Care performed: None  ____________________________________________   INITIAL IMPRESSION / ASSESSMENT AND PLAN / ED COURSE  Pertinent labs & imaging results that were available during my care of the patient were reviewed by me and considered in my medical decision making (see chart for details).  Pt with very poor circulation to her feet with what is a possible infection to her great toe.  This has been going on for months.  I d/w family,  they do not wish any heroic or surgical intervention for this 83 yo woman. They would like abx in case of infection,which certainly could be present, and they would like pain control. D/w on call vascular surgery at request of hospitalist and  who agree w/ mgt and will follow as needed  Family very clear about goals of care.     ____________________________________________   FINAL CLINICAL IMPRESSION(S) / ED DIAGNOSES  Final diagnoses:  Ischemic toe      This chart was dictated using voice recognition software.  Despite best efforts to proofread,  errors can occur which can change meaning.      Jeanmarie Plant, MD 02/19/17 2351

## 2017-02-19 NOTE — ED Notes (Signed)
Per dr Roxan Hockey, no ultrasound at this time.

## 2017-02-19 NOTE — ED Notes (Signed)
Admitting MD at bedside.

## 2017-02-19 NOTE — H&P (Signed)
[facility] [place] at [facility]   PATIENT NAME: Kayla Collier    MR#:  [id number]  DATE OF BIRTH:  Mar 02, 1918  DATE OF ADMISSION:  02/19/2017  PRIMARY CARE PHYSICIAN: Redmond Baseman, MD   REQUESTING/REFERRING PHYSICIAN: Alphonzo Lemmings, MD  CHIEF COMPLAINT:   Chief Complaint  Patient presents with  . Toe Pain    HISTORY OF PRESENT ILLNESS:  Kayla Collier  is a 83 y.o. female who presents with left toe pain and discharge.  Patient began having toe pain two days ago, and has had increasing symptoms since then, including some purulent discharge.  Patient does not contribute much to her HPI, and information is provided by family at bedside.  Imaging shows soft tissue swelling without clear signs of osteomyelitis.  Hospitalists were called for admission  PAST MEDICAL HISTORY:   Past Medical History:  Diagnosis Date  . Anxiety   . Arthritis    RA  . Hypertension   . Seizures (HCC)     PAST SURGICAL HISTORY:   Past Surgical History:  Procedure Laterality Date  . CATARACT EXTRACTION    . TOTAL HIP ARTHROPLASTY      SOCIAL HISTORY:   Social History  Substance Use Topics  . Smoking status: Never Smoker  . Smokeless tobacco: Never Used  . Alcohol use No    FAMILY HISTORY:  No family history on file.  DRUG ALLERGIES:   Allergies  Allergen Reactions  . Vicodin [Hydrocodone-Acetaminophen] Other (See Comments)    hallucinations    MEDICATIONS AT HOME:   Prior to Admission medications   Medication Sig Start Date End Date Taking? Authorizing Provider  acetaminophen (TYLENOL) 500 MG tablet Take 1,000 mg by mouth 2 (two) times daily.   Yes Historical Provider, MD  amLODipine (NORVASC) 5 MG tablet Take 5 mg by mouth daily.   Yes Historical Provider, MD  aspirin EC 81 MG tablet Take 81 mg by mouth daily.   Yes Historical Provider, MD  atenolol (TENORMIN) 50 MG tablet Take 50 mg by mouth daily.   Yes Historical Provider, MD  cholecalciferol  (VITAMIN D) 1000 units tablet Take 1,000 Units by mouth daily.   Yes Historical Provider, MD  gabapentin (NEURONTIN) 100 MG capsule Take 100 mg by mouth 3 (three) times daily.   Yes Historical Provider, MD  Misc Natural Products (TART CHERRY ADVANCED) CAPS Take 1 capsule by mouth daily.   Yes Historical Provider, MD  phenytoin (DILANTIN) 100 MG ER capsule Take 100 mg by mouth at bedtime.    Yes Historical Provider, MD  phenytoin (DILANTIN) 30 MG ER capsule Take 30 mg by mouth 3 (three) times a Collier. Take at bedtime with 100mg  on Monday, Wednesday, and Friday   Yes Historical Provider, MD  potassium chloride (MICRO-K) 10 MEQ CR capsule Take 10 mEq by mouth daily.    Yes Historical Provider, MD  traMADol (ULTRAM) 50 MG tablet Take 50 mg by mouth every 8 (eight) hours as needed for moderate pain.    Yes Historical Provider, MD  vitamin B-12 (CYANOCOBALAMIN) 1000 MCG tablet Take 1,000 mcg by mouth daily.   Yes Historical Provider, MD  vitamin E (VITAMIN E) 400 UNIT capsule Take 400 Units by mouth daily.   Yes Historical Provider, MD    REVIEW OF SYSTEMS:  Review of Systems  Constitutional: Negative for chills, fever, malaise/fatigue and weight loss.  HENT: Negative for ear pain, hearing loss and tinnitus.   Eyes: Negative for blurred vision, double vision, pain  and redness.  Respiratory: Negative for cough, hemoptysis and shortness of breath.   Cardiovascular: Negative for chest pain, palpitations, orthopnea and leg swelling.  Gastrointestinal: Negative for abdominal pain, constipation, diarrhea, nausea and vomiting.  Genitourinary: Negative for dysuria, frequency and hematuria.  Musculoskeletal: Positive for joint pain (left toe). Negative for back pain and neck pain.  Skin:       No acne, rash, or lesions  Neurological: Negative for dizziness, tremors, focal weakness and weakness.  Endo/Heme/Allergies: Negative for polydipsia. Does not bruise/bleed easily.  Psychiatric/Behavioral: Negative for  depression. The patient is not nervous/anxious and does not have insomnia.      VITAL SIGNS:   Vitals:   02/19/17 2100 02/19/17 2130 02/19/17 2200 02/19/17 2230  BP: (!) 167/96 (!) 155/64 (!) 145/66 (!) 160/55  Pulse: 65 65 65 66  Resp:      Temp:      TempSrc:      SpO2: 100% 92% 92% (!) 87%  Weight:      Height:       Wt Readings from Last 3 Encounters:  02/19/17 42.6 kg (94 lb)  10/14/16 42.6 kg (94 lb)    PHYSICAL EXAMINATION:  Physical Exam  Vitals reviewed. Constitutional: She is oriented to person, place, and time. She appears well-developed and well-nourished. No distress.  HENT:  Head: Normocephalic and atraumatic.  Mouth/Throat: Oropharynx is clear and moist.  Eyes: Conjunctivae and EOM are normal. Pupils are equal, round, and reactive to light. No scleral icterus.  Neck: Normal range of motion. Neck supple. No JVD present. No thyromegaly present.  Cardiovascular: Normal rate and regular rhythm.  Exam reveals no gallop and no friction rub.   No murmur heard. Extremities cool to touch without mottling, non palpable DP pulses  Respiratory: Effort normal and breath sounds normal. No respiratory distress. She has no wheezes. She has no rales.  GI: Soft. Bowel sounds are normal. She exhibits no distension. There is no tenderness.  Musculoskeletal: Normal range of motion. She exhibits no edema.  Left toe discoloration, tenderness  Lymphadenopathy:    She has no cervical adenopathy.  Neurological: She is alert and oriented to person, place, and time. No cranial nerve deficit.  No dysarthria, no aphasia  Skin: Skin is warm and dry. No rash noted. No erythema.  Psychiatric: She has a normal mood and affect. Her behavior is normal. Judgment and thought content normal.    LABORATORY PANEL:   CBC  Recent Labs Lab 02/19/17 1854  WBC 7.2  HGB 10.7*  HCT 31.6*  PLT 306    ------------------------------------------------------------------------------------------------------------------  Chemistries   Recent Labs Lab 02/19/17 1854  NA 132*  K 4.2  CL 94*  CO2 29  GLUCOSE 120*  BUN 17  CREATININE 0.46  CALCIUM 8.8*   ------------------------------------------------------------------------------------------------------------------  Cardiac Enzymes No results for input(s): TROPONINI in the last 168 hours. ------------------------------------------------------------------------------------------------------------------  RADIOLOGY:  Dg Foot Complete Left  Result Date: 02/19/2017 CLINICAL DATA:  Pain in the left great toe for the last several weeks. Some drainage. EXAM: LEFT FOOT - COMPLETE 3+ VIEW COMPARISON:  None. FINDINGS: Distal all soft tissue swelling. No evidence of erosion or lytic change that would allow diagnosis of osteomyelitis by plain radiography. The remainder of the foot is negative except for some chronic arthritic change of the inter phalangeal joint of the small toe. IMPRESSION: Soft tissue swelling of the distal great toe. No underlying bone abnormality seen. Electronically Signed   By: Paulina Fusi M.D.   On:  02/19/2017 22:13    EKG:  No orders found for this or any previous visit.  IMPRESSION AND PLAN:  Principal Problem:   Gangrene of foot (HCC) - with toe infection, IV antibiotics, vascular surgery consult Active Problems:   HTN (hypertension) - continue home meds   Anxiety - home dose anxiolytics   Seizures (HCC) - home dose antiepileptics  All the records are reviewed and case discussed with ED provider. Management plans discussed with the patient and/or family.  DVT PROPHYLAXIS: SubQ lovenox  GI PROPHYLAXIS: None  ADMISSION STATUS: Inpatient  CODE STATUS: DNR Code Status History    This patient does not have a recorded code status. Please follow your organizational policy for patients in this situation.       TOTAL TIME TAKING CARE OF THIS PATIENT: 45 minutes.   Shila Kruczek FIELDING 02/19/2017, 11:21 PM  [facility]  Office  [phone]  CC: Primary care physician; Redmond Baseman, MD  Note:  This document was prepared using Dragon voice recognition software and may include unintentional dictation errors.

## 2017-02-19 NOTE — ED Notes (Signed)
XR bedside.

## 2017-02-20 DIAGNOSIS — I96 Gangrene, not elsewhere classified: Principal | ICD-10-CM

## 2017-02-20 LAB — CBC
HEMATOCRIT: 31.7 % — AB (ref 35.0–47.0)
Hemoglobin: 10.6 g/dL — ABNORMAL LOW (ref 12.0–16.0)
MCH: 30 pg (ref 26.0–34.0)
MCHC: 33.5 g/dL (ref 32.0–36.0)
MCV: 89.6 fL (ref 80.0–100.0)
Platelets: 318 10*3/uL (ref 150–440)
RBC: 3.54 MIL/uL — ABNORMAL LOW (ref 3.80–5.20)
RDW: 14.3 % (ref 11.5–14.5)
WBC: 7.9 10*3/uL (ref 3.6–11.0)

## 2017-02-20 LAB — BASIC METABOLIC PANEL
ANION GAP: 6 (ref 5–15)
BUN: 12 mg/dL (ref 6–20)
CO2: 31 mmol/L (ref 22–32)
Calcium: 8.9 mg/dL (ref 8.9–10.3)
Chloride: 95 mmol/L — ABNORMAL LOW (ref 101–111)
Creatinine, Ser: 0.48 mg/dL (ref 0.44–1.00)
Glucose, Bld: 104 mg/dL — ABNORMAL HIGH (ref 65–99)
POTASSIUM: 3.5 mmol/L (ref 3.5–5.1)
SODIUM: 132 mmol/L — AB (ref 135–145)

## 2017-02-20 MED ORDER — ENOXAPARIN SODIUM 30 MG/0.3ML ~~LOC~~ SOLN
30.0000 mg | SUBCUTANEOUS | Status: DC
  Administered 2017-02-20 – 2017-02-22 (×3): 30 mg via SUBCUTANEOUS
  Filled 2017-02-20 (×3): qty 0.3

## 2017-02-20 MED ORDER — ONDANSETRON HCL 4 MG/2ML IJ SOLN: 4.0000 mg | Freq: Four times a day (QID) | INTRAMUSCULAR | Status: DC | PRN

## 2017-02-20 MED ORDER — ASPIRIN EC 81 MG PO TBEC
81.0000 mg | DELAYED_RELEASE_TABLET | Freq: Every day | ORAL | Status: DC
Start: 2017-02-20 — End: 2017-02-23
  Administered 2017-02-20 – 2017-02-23 (×4): 81 mg via ORAL
  Filled 2017-02-20 (×4): qty 1

## 2017-02-20 MED ORDER — ALPRAZOLAM 0.25 MG PO TABS
0.2500 mg | ORAL_TABLET | Freq: Once | ORAL | Status: AC
Start: 2017-02-20 — End: 2017-02-20
  Administered 2017-02-20: 0.25 mg via ORAL
  Filled 2017-02-20: qty 1

## 2017-02-20 MED ORDER — VANCOMYCIN HCL IN DEXTROSE 750-5 MG/150ML-% IV SOLN
750.0000 mg | INTRAVENOUS | Status: DC
  Administered 2017-02-21 – 2017-02-22 (×2): 750 mg via INTRAVENOUS
  Filled 2017-02-20 (×3): qty 150

## 2017-02-20 MED ORDER — PHENYTOIN SODIUM EXTENDED 100 MG PO CAPS
100.0000 mg | ORAL_CAPSULE | Freq: Every day | ORAL | Status: DC
  Administered 2017-02-20 – 2017-02-22 (×4): 100 mg via ORAL
  Filled 2017-02-20 (×5): qty 1

## 2017-02-20 MED ORDER — ENOXAPARIN SODIUM 40 MG/0.4ML ~~LOC~~ SOLN
40.0000 mg | SUBCUTANEOUS | Status: DC
Start: 2017-02-20 — End: 2017-02-20

## 2017-02-20 MED ORDER — ONDANSETRON HCL 4 MG PO TABS: 4.0000 mg | ORAL_TABLET | Freq: Four times a day (QID) | ORAL | Status: DC | PRN

## 2017-02-20 MED ORDER — PIPERACILLIN-TAZOBACTAM 3.375 G IVPB
3.3750 g | Freq: Three times a day (TID) | INTRAVENOUS | Status: DC
  Administered 2017-02-20 – 2017-02-23 (×10): 3.375 g via INTRAVENOUS
  Filled 2017-02-20 (×13): qty 50

## 2017-02-20 MED ORDER — ATENOLOL 50 MG PO TABS
50.0000 mg | ORAL_TABLET | Freq: Every day | ORAL | Status: DC
  Administered 2017-02-20 – 2017-02-23 (×4): 50 mg via ORAL
  Filled 2017-02-20 (×4): qty 1

## 2017-02-20 MED ORDER — PHENYTOIN SODIUM EXTENDED 30 MG PO CAPS
30.0000 mg | ORAL_CAPSULE | ORAL | Status: DC
  Administered 2017-02-20 – 2017-02-22 (×2): 30 mg via ORAL
  Filled 2017-02-20 (×2): qty 1

## 2017-02-20 MED ORDER — AMLODIPINE BESYLATE 5 MG PO TABS
5.0000 mg | ORAL_TABLET | Freq: Every day | ORAL | Status: DC
Start: 2017-02-20 — End: 2017-02-23
  Administered 2017-02-20 – 2017-02-23 (×4): 5 mg via ORAL
  Filled 2017-02-20 (×4): qty 1

## 2017-02-20 MED ORDER — TRAMADOL HCL 50 MG PO TABS
50.0000 mg | ORAL_TABLET | Freq: Four times a day (QID) | ORAL | Status: DC | PRN
  Administered 2017-02-20 (×2): 50 mg via ORAL
  Filled 2017-02-20 (×2): qty 1

## 2017-02-20 MED ORDER — MORPHINE SULFATE (PF) 2 MG/ML IV SOLN
2.0000 mg | INTRAVENOUS | Status: DC | PRN
  Administered 2017-02-20: 2 mg via INTRAVENOUS
  Filled 2017-02-20: qty 1

## 2017-02-20 MED ORDER — GABAPENTIN 100 MG PO CAPS
100.0000 mg | ORAL_CAPSULE | Freq: Three times a day (TID) | ORAL | Status: DC
  Administered 2017-02-20 – 2017-02-23 (×10): 100 mg via ORAL
  Filled 2017-02-20 (×10): qty 1

## 2017-02-20 MED ORDER — GABAPENTIN 100 MG PO CAPS: 100.0000 mg | ORAL_CAPSULE | Freq: Two times a day (BID) | ORAL | Status: DC

## 2017-02-20 NOTE — Evaluation (Signed)
Physical Therapy Evaluation Patient Details Name: Kayla Collier MRN: [id number] DOB: 1918-07-13 Today's Date: 02/20/2017   History of Present Illness  Pt admitted for gangrene of L foot with complaints of pain in toe x 2 days. History included anxiety, RA, HTN, and seizures. Pt is very HOH. No podiatry consult at this time, however no WB restrictions per RN.  Clinical Impression  Pt is a pleasant 83 year old female who was admitted for L foot gangrene. Pt performs bed mobility with min assist and then transfers to recliner with max assist and no AD. Pt very fearful of falling and has difficulty placing weight on heels secondary to pain in B feet. Not safe for ambulation this date. Recommended +2 for RN to return back to bed. Pt demonstrates deficits with strength/mobility. Pt is currently not at baseline level. Would benefit from skilled PT to address above deficits and promote optimal return to PLOF; recommend transition to STR upon discharge from acute hospitalization.       Follow Up Recommendations SNF    Equipment Recommendations  Rolling walker with 5" wheels    Recommendations for Other Services       Precautions / Restrictions Precautions Precautions: Fall Restrictions Weight Bearing Restrictions: No      Mobility  Bed Mobility Overal bed mobility: Needs Assistance Bed Mobility: Supine to Sit     Supine to sit: Min assist     General bed mobility comments: needs assist for trunk mobility and scooting out towards EOB. Pt then able to sit with upright posture  Transfers Overall transfer level: Needs assistance Equipment used: Rolling walker (2 wheeled) Transfers: Sit to/from Stand Sit to Stand: Max assist         General transfer comment: attempted transfer at EOB using RW. Pt unable to keep B feet under her prior to standing secondary to pain. Pt with heavy post leaning and unable to stand with upright posture. Stand pivot transfer performed without RW and max  assist for pt to transfer to recliner. Unable to further ambulate at this time  Ambulation/Gait                Stairs            Wheelchair Mobility    Modified Rankin (Stroke Patients Only)       Balance Overall balance assessment: History of Falls;Needs assistance Sitting-balance support: Feet supported Sitting balance-Leahy Scale: Good     Standing balance support: Bilateral upper extremity supported Standing balance-Leahy Scale: Zero Standing balance comment: heavy post leaning noted                             Pertinent Vitals/Pain Pain Assessment: Faces Faces Pain Scale: Hurts whole lot Pain Location: B plantar surfaces of feet Pain Descriptors / Indicators: Aching;Dull;Discomfort Pain Intervention(s): Limited activity within patient's tolerance    Home Living Family/patient expects to be discharged to:: Private residence Living Arrangements: Children Available Help at Discharge: Family;Personal care attendant (lives with daughter; has aide 4hr/day) Type of Home: House Home Access: Stairs to enter Entrance Stairs-Rails: None Entrance Stairs-Number of Steps: 1 step, landing and then 1 step to enter home Home Layout: One level Home Equipment: Wheelchair - [employer] - 4 wheels      Prior Function Level of Independence: Independent with assistive device(s)         Comments: was previously indep with all ambulation, however recently in the past week has not been  ambulatory secondary to foot pain.     Hand Dominance        Extremity/Trunk Assessment   Upper Extremity Assessment Upper Extremity Assessment: Overall WFL for tasks assessed    Lower Extremity Assessment Lower Extremity Assessment: Generalized weakness (B LE grossly 3+/5)       Communication   Communication: No difficulties  Cognition Arousal/Alertness: Awake/alert Behavior During Therapy: WFL for tasks assessed/performed Overall Cognitive Status: Within  Functional Limits for tasks assessed                                        General Comments      Exercises     Assessment/Plan    PT Assessment Patient needs continued PT services  PT Problem List Decreased strength;Decreased balance;Decreased mobility;Decreased safety awareness;Pain       PT Treatment Interventions Gait training;DME instruction;Therapeutic exercise    PT Goals (Current goals can be found in the Care Plan section)  Acute Rehab PT Goals Patient Stated Goal: to get stronger PT Goal Formulation: With patient Time For Goal Achievement: 03/06/17 Potential to Achieve Goals: Good    Frequency Min 2X/week   Barriers to discharge Inaccessible home environment;Decreased caregiver support      Co-evaluation               End of Session Equipment Utilized During Treatment: Gait belt Activity Tolerance: Patient limited by pain Patient left: in chair;with chair alarm set Nurse Communication: Mobility status PT Visit Diagnosis: Unsteadiness on feet (R26.81);Repeated falls (R29.6);Muscle weakness (generalized) (M62.81);History of falling (Z91.81);Pain Pain - Right/Left: Left Pain - part of body: Ankle and joints of foot    Time: 1135-1156 PT Time Calculation (min) (ACUTE ONLY): 21 min   Charges:   PT Evaluation $PT Eval Moderate Complexity: 1 Procedure     PT G Codes:        Elizabeth Palau, PT, DPT [phone]   Rondia Higginbotham 02/20/2017, 2:06 PM

## 2017-02-20 NOTE — Progress Notes (Signed)
Pharmacy Antibiotic Note  Kayla Collier is a 83 y.o. female admitted on 02/19/2017 with wound infection.  Pharmacy has been consulted for vancomycin and Zosyn dosing.  Plan: DW 45kg  Vd 32L kei 0.028 hr-1  t1/2 25 hours Vancomycin 750 mg q 36 hours ordered with stacked dosing. Level before 4th dose. Goal trough 15-20.  Zosyn 3.375 grams q 8 hours ordered.  Height: 5' (152.4 cm) Weight: 98 lb 1.6 oz (44.5 kg) IBW/kg (Calculated) : 45.5  Temp (24hrs), Avg:98.7 F (37.1 C), Min:98.2 F (36.8 C), Max:99.1 F (37.3 C)   Recent Labs Lab 02/19/17 1854  WBC 7.2  CREATININE 0.46    Estimated Creatinine Clearance: 28.9 mL/min (by C-G formula based on SCr of 0.46 mg/dL).    Allergies  Allergen Reactions  . Vicodin [Hydrocodone-Acetaminophen] Other (See Comments)    hallucinations    Antimicrobials this admission: vancomycin Zosyn 4/20 >>    >>   Dose adjustments this admission:   Microbiology results: No micro  Thank you for allowing pharmacy to be a part of this patient's care.  Kayla Collier S 02/20/2017 1:26 AM

## 2017-02-20 NOTE — Consult Note (Signed)
Reason for Consult:Left Foot infection Referring Physician: Dr. Al Corpus is an 83 y.o. female.  HPI: Pleasant 52yof with a left foot infection, purulent drainage from great toe and pain for ~ 1week. No other symptomology noted.  Past Medical History:  Diagnosis Date  . Anxiety   . Arthritis    RA  . Hypertension   . Seizures ([place])     Past Surgical History:  Procedure Laterality Date  . CATARACT EXTRACTION    . TOTAL HIP ARTHROPLASTY      No family history on file.  Social History:  reports that she has never smoked. She has never used smokeless tobacco. She reports that she does not drink alcohol or use drugs.  Allergies:  Allergies  Allergen Reactions  . Vicodin [Hydrocodone-Acetaminophen] Other (See Comments)    hallucinations    Medications: I have reviewed the patient's current medications.  Results for orders placed or performed during the hospital encounter of 02/19/17 (from the past 48 hour(s))  Basic metabolic panel     Status: Abnormal   Collection Time: 02/19/17  6:54 PM  Result Value Ref Range   Sodium 132 (L) 135 - 145 mmol/L   Potassium 4.2 3.5 - 5.1 mmol/L   Chloride 94 (L) 101 - 111 mmol/L   CO2 29 22 - 32 mmol/L   Glucose, Bld 120 (H) 65 - 99 mg/dL   BUN 17 6 - 20 mg/dL   Creatinine, Ser 0.46 0.44 - 1.00 mg/dL   Calcium 8.8 (L) 8.9 - 10.3 mg/dL   GFR calc non Af Amer >60 >60 mL/min   GFR calc Af Amer >60 >60 mL/min    Comment: (NOTE) The eGFR has been calculated using the CKD EPI equation. This calculation has not been validated in all clinical situations. eGFR's persistently <60 mL/min signify possible Chronic Kidney Disease.    Anion gap 9 5 - 15  CBC     Status: Abnormal   Collection Time: 02/19/17  6:54 PM  Result Value Ref Range   WBC 7.2 3.6 - 11.0 K/uL   RBC 3.48 (L) 3.80 - 5.20 MIL/uL   Hemoglobin 10.7 (L) 12.0 - 16.0 g/dL   HCT 31.6 (L) 35.0 - 47.0 %   MCV 90.8 80.0 - 100.0 fL   MCH 30.8 26.0 - 34.0 pg   MCHC 33.9  32.0 - 36.0 g/dL   RDW 14.4 11.5 - 14.5 %   Platelets 306 150 - 440 K/uL  Basic metabolic panel     Status: Abnormal   Collection Time: 02/20/17  4:30 AM  Result Value Ref Range   Sodium 132 (L) 135 - 145 mmol/L   Potassium 3.5 3.5 - 5.1 mmol/L   Chloride 95 (L) 101 - 111 mmol/L   CO2 31 22 - 32 mmol/L   Glucose, Bld 104 (H) 65 - 99 mg/dL   BUN 12 6 - 20 mg/dL   Creatinine, Ser 0.48 0.44 - 1.00 mg/dL   Calcium 8.9 8.9 - 10.3 mg/dL   GFR calc non Af Amer >60 >60 mL/min   GFR calc Af Amer >60 >60 mL/min    Comment: (NOTE) The eGFR has been calculated using the CKD EPI equation. This calculation has not been validated in all clinical situations. eGFR's persistently <60 mL/min signify possible Chronic Kidney Disease.    Anion gap 6 5 - 15  CBC     Status: Abnormal   Collection Time: 02/20/17  4:30 AM  Result Value Ref Range  WBC 7.9 3.6 - 11.0 K/uL   RBC 3.54 (L) 3.80 - 5.20 MIL/uL   Hemoglobin 10.6 (L) 12.0 - 16.0 g/dL   HCT 31.7 (L) 35.0 - 47.0 %   MCV 89.6 80.0 - 100.0 fL   MCH 30.0 26.0 - 34.0 pg   MCHC 33.5 32.0 - 36.0 g/dL   RDW 14.3 11.5 - 14.5 %   Platelets 318 150 - 440 K/uL    Dg Foot Complete Left  Result Date: 02/19/2017 CLINICAL DATA:  Pain in the left great toe for the last several weeks. Some drainage. EXAM: LEFT FOOT - COMPLETE 3+ VIEW COMPARISON:  None. FINDINGS: Distal all soft tissue swelling. No evidence of erosion or lytic change that would allow diagnosis of osteomyelitis by plain radiography. The remainder of the foot is negative except for some chronic arthritic change of the inter phalangeal joint of the small toe. IMPRESSION: Soft tissue swelling of the distal great toe. No underlying bone abnormality seen. Electronically Signed   By: Nelson Chimes M.D.   On: 02/19/2017 22:13    Review of Systems  Constitutional: Negative for chills and fever.  Musculoskeletal:       Left foot pain  Skin: Negative.    Blood pressure (!) 173/61, pulse 63,  temperature 98.2 F (36.8 C), temperature source Oral, resp. rate 18, height 5' (1.524 m), weight 44.5 kg (98 lb 1.6 oz), SpO2 98 %. Physical Exam  Constitutional: She appears well-developed.  Cardiovascular: Normal rate and regular rhythm.   Respiratory: Effort normal and breath sounds normal.  Musculoskeletal:  Left foot: Toes 1-4 and plantar surface of foot with evidence of infection. Minimal Purulent drainage from great toe. Foot otherwise warm however, no appreciable DP/PT. + motor/sensory.  Neurological: She is alert.    Assessment/Plan:  Left foot infection/gangrene  Would not recommend surgical intervention. Antibiotic coverage Local wound care: Keep foot clean with saline. Betadine paint to toes with dry 4x4 BETWEEN toes- DAILY  Will likely demarcate.  Please call/reconsult for further concerns.  Colden Samaras A 02/20/2017, 9:24 AM

## 2017-02-20 NOTE — Progress Notes (Signed)
[facility] [place] at [facility]   PATIENT NAME: Kayla Collier    MR#:  [id number]  DATE OF BIRTH:  [birth year]  SUBJECTIVE:  CHIEF COMPLAINT:   Chief Complaint  Patient presents with  . Toe Pain   Complains of pain in left foot. No other concerns. Daughterat bedside.  REVIEW OF SYSTEMS:    Review of Systems  Constitutional: Negative for chills and fever.  HENT: Negative for sore throat.   Eyes: Negative for blurred vision, double vision and pain.  Respiratory: Negative for cough, hemoptysis, shortness of breath and wheezing.   Cardiovascular: Negative for chest pain, palpitations, orthopnea and leg swelling.  Gastrointestinal: Negative for abdominal pain, constipation, diarrhea, heartburn, nausea and vomiting.  Genitourinary: Negative for dysuria and hematuria.  Musculoskeletal: Positive for joint pain. Negative for back pain.  Skin: Negative for rash.  Neurological: Negative for sensory change, speech change, focal weakness and headaches.  Endo/Heme/Allergies: Does not bruise/bleed easily.  Psychiatric/Behavioral: Negative for depression. The patient is not nervous/anxious.     DRUG ALLERGIES:   Allergies  Allergen Reactions  . Vicodin [Hydrocodone-Acetaminophen] Other (See Comments)    hallucinations    VITALS:  Blood pressure (!) 186/64, pulse 67, temperature 98.5 F (36.9 C), temperature source Oral, resp. rate 18, height 5' (1.524 m), weight 44.5 kg (98 lb 1.6 oz), SpO2 99 %.  PHYSICAL EXAMINATION:   Physical Exam  GENERAL:  83 y.o.-year-old patient lying in the bed with no acute distress.  EYES: Pupils equal, round, reactive to light and accommodation. No scleral icterus. Extraocular muscles intact.  HEENT: Head atraumatic, normocephalic. Oropharynx and nasopharynx clear.  NECK:  Supple, no jugular venous distention. No thyroid enlargement, no tenderness.  LUNGS: Normal breath sounds bilaterally, no wheezing, rales, rhonchi. No use of  accessory muscles of respiration.  CARDIOVASCULAR: S1, S2 normal. No murmurs, rubs, or gallops.  ABDOMEN: Soft, nontender, nondistended. Bowel sounds present. No organomegaly or mass.  EXTREMITIES: Left foot 124 process seem infected. Foul-smelling. No discharge. Tenderness and warmth in the plantar area of the foot. NEUROLOGIC: Cranial nerves II through XII are intact. No focal Motor or sensory deficits b/l.   PSYCHIATRIC: The patient is alert and oriented x 3.  SKIN: No obvious rash, lesion, or ulcer.   LABORATORY PANEL:   CBC  Recent Labs Lab 02/20/17 0430  WBC 7.9  HGB 10.6*  HCT 31.7*  PLT 318   ------------------------------------------------------------------------------------------------------------------ Chemistries   Recent Labs Lab 02/20/17 0430  NA 132*  K 3.5  CL 95*  CO2 31  GLUCOSE 104*  BUN 12  CREATININE 0.48  CALCIUM 8.9   ------------------------------------------------------------------------------------------------------------------  Cardiac Enzymes No results for input(s): TROPONINI in the last 168 hours. ------------------------------------------------------------------------------------------------------------------  RADIOLOGY:  Dg Foot Complete Left  Result Date: 02/19/2017 CLINICAL DATA:  Pain in the left great toe for the last several weeks. Some drainage. EXAM: LEFT FOOT - COMPLETE 3+ VIEW COMPARISON:  None. FINDINGS: Distal all soft tissue swelling. No evidence of erosion or lytic change that would allow diagnosis of osteomyelitis by plain radiography. The remainder of the foot is negative except for some chronic arthritic change of the inter phalangeal joint of the small toe. IMPRESSION: Soft tissue swelling of the distal great toe. No underlying bone abnormality seen. Electronically Signed   By: Paulina Fusi M.D.   On: 02/19/2017 22:13     ASSESSMENT AND PLAN:   * Left foot infection with PAD On IV abx Discussed with Dr. Evie Lacks of  vascular surgery.  Advised IV antibiotics. Area of likely demarcate. No need for surgery at this time. Added pain medications. On Neurontin. Consult physical therapy.  * Hypertension. Continue home medications.  * History of seizures. On phenytoin.  All the records are reviewed and case discussed with Care Management/Social Workerr. Management plans discussed with the patient, family and they are in agreement.  CODE STATUS: DNR  DVT Prophylaxis: SCDs  TOTAL TIME TAKING CARE OF THIS PATIENT: 30 minutes.   POSSIBLE D/C IN 1-2 DAYS, DEPENDING ON CLINICAL CONDITION.  Milagros Loll R M.D on 02/20/2017 at 2:40 PM  Between 7am to 6pm - Pager - [phone]  After 6pm go to [email or web address] - password [email or web address] [facility]  [facility] [place] [facility]  Office  [phone]  CC: Primary care physician; Redmond Baseman, MD  Note: This dictation was prepared with Dragon dictation along with smaller phrase technology. Any transcriptional errors that result from this process are unintentional.

## 2017-02-20 NOTE — Progress Notes (Signed)
Lovenox changed to 30 mg daily for CrCl <30 and BMI <40. 

## 2017-02-20 NOTE — Progress Notes (Signed)
Pt is getting agitated, trying to get out of bed, family members at bedside. Daughter Misty Stanley is requesting if it is "possible to increase her pain medicine so Mom can calm down". Education given to daughter and grand daughter, both verbalized understanding. Paged and spoke to Dr. Anne Hahn, ordered to give one time dose of Xanax 0.25mg  PO. Will administer as ordered, continue to monitor, with bed alarm kept activated and side rails up.

## 2017-02-21 MED ORDER — HYDRALAZINE HCL 50 MG PO TABS
50.0000 mg | ORAL_TABLET | Freq: Three times a day (TID) | ORAL | Status: DC
  Administered 2017-02-21 – 2017-02-23 (×5): 50 mg via ORAL
  Filled 2017-02-21 (×5): qty 1

## 2017-02-21 MED ORDER — HYDRALAZINE HCL 20 MG/ML IJ SOLN
10.0000 mg | Freq: Four times a day (QID) | INTRAMUSCULAR | Status: DC | PRN
  Administered 2017-02-21 – 2017-02-22 (×2): 10 mg via INTRAVENOUS
  Filled 2017-02-21 (×3): qty 1

## 2017-02-21 MED ORDER — RAMELTEON 8 MG PO TABS
8.0000 mg | ORAL_TABLET | Freq: Every day | ORAL | Status: DC
  Administered 2017-02-21 – 2017-02-22 (×2): 8 mg via ORAL
  Filled 2017-02-21 (×2): qty 1

## 2017-02-21 MED ORDER — OXYCODONE HCL 5 MG PO TABS: 5.0000 mg | ORAL_TABLET | ORAL | Status: DC | PRN

## 2017-02-21 MED ORDER — HALOPERIDOL LACTATE 5 MG/ML IJ SOLN
2.0000 mg | Freq: Once | INTRAMUSCULAR | Status: AC
  Administered 2017-02-21: 2 mg via INTRAVENOUS
  Filled 2017-02-21: qty 1

## 2017-02-21 NOTE — Progress Notes (Signed)
[facility] - [place] at [facility]   PATIENT NAME: Kayla Collier    MR#:  [id number]  DATE OF BIRTH:  Jan 15, 1918  SUBJECTIVE:  CHIEF COMPLAINT:   Chief Complaint  Patient presents with  . Toe Pain   Agitated overnight due to pain. Comfortable today. Tmax 99.8.  REVIEW OF SYSTEMS:    Review of Systems  Constitutional: Negative for chills and fever.  HENT: Negative for sore throat.   Eyes: Negative for blurred vision, double vision and pain.  Respiratory: Negative for cough, hemoptysis, shortness of breath and wheezing.   Cardiovascular: Negative for chest pain, palpitations, orthopnea and leg swelling.  Gastrointestinal: Negative for abdominal pain, constipation, diarrhea, heartburn, nausea and vomiting.  Genitourinary: Negative for dysuria and hematuria.  Musculoskeletal: Positive for joint pain. Negative for back pain.  Skin: Negative for rash.  Neurological: Negative for sensory change, speech change, focal weakness and headaches.  Endo/Heme/Allergies: Does not bruise/bleed easily.  Psychiatric/Behavioral: Negative for depression. The patient is not nervous/anxious.     DRUG ALLERGIES:   Allergies  Allergen Reactions  . Vicodin [Hydrocodone-Acetaminophen] Other (See Comments)    hallucinations    VITALS:  Blood pressure (!) 162/87, pulse 76, temperature 99.7 F (37.6 C), temperature source Oral, resp. rate 18, height 5' (1.524 m), weight 44.5 kg (98 lb 1.6 oz), SpO2 100 %.  PHYSICAL EXAMINATION:   Physical Exam  GENERAL:  83 y.o.-year-old patient lying in the bed with no acute distress.  EYES: Pupils equal, round, reactive to light and accommodation. No scleral icterus. Extraocular muscles intact.  HEENT: Head atraumatic, normocephalic. Oropharynx and nasopharynx clear.  NECK:  Supple, no jugular venous distention. No thyroid enlargement, no tenderness.  LUNGS: Normal breath sounds bilaterally, no wheezing, rales, rhonchi. No use of accessory  muscles of respiration.  CARDIOVASCULAR: S1, S2 normal. No murmurs, rubs, or gallops.  ABDOMEN: Soft, nontender, nondistended. Bowel sounds present. No organomegaly or mass.  EXTREMITIES: Left foot 124 process seem infected. Foul-smelling. No discharge. Tenderness and warmth in the plantar area of the foot. NEUROLOGIC: Cranial nerves II through XII are intact. No focal Motor or sensory deficits b/l.   PSYCHIATRIC: The patient is alert and oriented x 3.  SKIN: No obvious rash, lesion, or ulcer.   LABORATORY PANEL:   CBC  Recent Labs Lab 02/20/17 0430  WBC 7.9  HGB 10.6*  HCT 31.7*  PLT 318   ------------------------------------------------------------------------------------------------------------------ Chemistries   Recent Labs Lab 02/20/17 0430  NA 132*  K 3.5  CL 95*  CO2 31  GLUCOSE 104*  BUN 12  CREATININE 0.48  CALCIUM 8.9   ------------------------------------------------------------------------------------------------------------------  Cardiac Enzymes No results for input(s): TROPONINI in the last 168 hours. ------------------------------------------------------------------------------------------------------------------  RADIOLOGY:  Dg Foot Complete Left  Result Date: 02/19/2017 CLINICAL DATA:  Pain in the left great toe for the last several weeks. Some drainage. EXAM: LEFT FOOT - COMPLETE 3+ VIEW COMPARISON:  None. FINDINGS: Distal all soft tissue swelling. No evidence of erosion or lytic change that would allow diagnosis of osteomyelitis by plain radiography. The remainder of the foot is negative except for some chronic arthritic change of the inter phalangeal joint of the small toe. IMPRESSION: Soft tissue swelling of the distal great toe. No underlying bone abnormality seen. Electronically Signed   By: Paulina Fusi M.D.   On: 02/19/2017 22:13     ASSESSMENT AND PLAN:   * Left foot infection with PAD On IV abx Discussed with Dr. Evie Lacks of vascular  surgery. Advised IV  antibiotics. Area will likely demarcate. No need for surgery at this time.  On Neurontin. Consult physical therapy. Increase pain medications.  * Hypertension. Continue home medications.  * History of seizures. On phenytoin.  All the records are reviewed and case discussed with Care Management/Social Worker Management plans discussed with the patient, family and they are in agreement.  CODE STATUS: DNR  DVT Prophylaxis: SCDs  TOTAL TIME TAKING CARE OF THIS PATIENT: 30 minutes.   POSSIBLE D/C IN 1-2 DAYS, DEPENDING ON CLINICAL CONDITION.  Milagros Loll R M.D on 02/21/2017 at 12:06 PM  Between 7am to 6pm - Pager - [phone]  After 6pm go to [email or web address] - password [email or web address] [facility]  [facility] [place] [facility]  Office  [phone]  CC: Primary care physician; Redmond Baseman, MD  Note: This dictation was prepared with Dragon dictation along with smaller phrase technology. Any transcriptional errors that result from this process are unintentional.

## 2017-02-21 NOTE — Progress Notes (Signed)
Pts BP 232/98. MD Sudini notified. Orders received for IV Hydralazine 10 mg Q6 PRN >180. Order placed.

## 2017-02-21 NOTE — Progress Notes (Signed)
Pt woke up becoming very agitated, unable to follow commands, trying to get up on bed, argumentative and combative. Paged and spoke to Dr. Sheryle Hail, ordered to give 2mg  Haldol IV one time dose. Will administer as ordered and continue to monitor.

## 2017-02-21 NOTE — [place] FL2 (Signed)
[place] MEDICAID FL2 LEVEL OF CARE SCREENING TOOL     IDENTIFICATION  Patient Name: Kayla Collier Birthdate: [birth year] Sex: female Admission Date (Current Location): 02/19/2017  [place] and [place] Number:  Chiropodist and Address:  [facility], [address], [place], [place] [address]      Provider Number: [id number]  Attending Physician Name and Address:  Milagros Loll, MD  Relative Name and Phone Number:       Current Level of Care: Hospital Recommended Level of Care: Skilled Nursing Facility Prior Approval Number:    Date Approved/Denied:   PASRR Number: [id number]  Discharge Plan: SNF    Current Diagnoses: Patient Active Problem List   Diagnosis Date Noted  . Gangrene of foot (HCC) 02/19/2017  . HTN (hypertension) 02/19/2017  . Anxiety 02/19/2017  . Seizures (HCC) 02/19/2017    Orientation RESPIRATION BLADDER Height & Weight     Self, Time, Situation, Place  Normal Continent Weight: 98 lb 1.6 oz (44.5 kg) Height:  5' (152.4 cm)  BEHAVIORAL SYMPTOMS/MOOD NEUROLOGICAL BOWEL NUTRITION STATUS      Continent    AMBULATORY STATUS COMMUNICATION OF NEEDS Skin   Extensive Assist Verbally Bruising (Left foot 124 process seem infected. Foul-smelling. No discharge. Tenderness and warmth in the plantar area of the foot.)                       Personal Care Assistance Level of Assistance  Bathing, Feeding, Dressing Bathing Assistance: Limited assistance Feeding assistance: Independent Dressing Assistance: Limited assistance     Functional Limitations Info  Hearing   Hearing Info: Impaired      SPECIAL CARE FACTORS FREQUENCY  PT (By licensed PT)     PT Frequency: Up to 5X per day, 5 days per week              Contractures Contractures Info: Present    Additional Factors Info  Code Status, Allergies Code Status Info: DNR Allergies Info: Vicodin Hydrocodone-acetaminophen           Current Medications  (02/21/2017):  This is the current hospital active medication list Current Facility-Administered Medications  Medication Dose Route Frequency Provider Last Rate Last Dose  . amLODipine (NORVASC) tablet 5 mg  5 mg Oral Daily Oralia Manis, MD   5 mg at 02/21/17 0850  . aspirin EC tablet 81 mg  81 mg Oral Daily Oralia Manis, MD   81 mg at 02/21/17 0850  . atenolol (TENORMIN) tablet 50 mg  50 mg Oral Daily Oralia Manis, MD   50 mg at 02/21/17 0851  . enoxaparin (LOVENOX) injection 30 mg  30 mg Subcutaneous Q24H Oralia Manis, MD   30 mg at 02/20/17 2121  . gabapentin (NEURONTIN) capsule 100 mg  100 mg Oral TID Oralia Manis, MD   100 mg at 02/21/17 0850  . ondansetron (ZOFRAN) tablet 4 mg  4 mg Oral Q6H PRN Oralia Manis, MD       Or  . ondansetron [facility]) injection 4 mg  4 mg Intravenous Q6H PRN Oralia Manis, MD      . oxyCODONE (Oxy IR/ROXICODONE) immediate release tablet 5-10 mg  5-10 mg Oral Q4H PRN Srikar Sudini, MD      . phenytoin (DILANTIN) ER capsule 100 mg  100 mg Oral QHS Oralia Manis, MD   100 mg at 02/20/17 2121  . phenytoin (DILANTIN) ER capsule 30 mg  30 mg Oral Once per day on Mon Wed Fri  Oralia Manis, MD   30 mg at 02/20/17 0206  . piperacillin-tazobactam (ZOSYN) IVPB 3.375 g  3.375 g Intravenous Q8H Oralia Manis, MD 12.5 mL/hr at 02/21/17 1455 3.375 g at 02/21/17 1455  . traMADol (ULTRAM) tablet 50 mg  50 mg Oral Q6H PRN Oralia Manis, MD   50 mg at 02/20/17 1833  . vancomycin (VANCOCIN) IVPB 750 mg/150 ml premix  750 mg Intravenous Q36H Oralia Manis, MD   Stopped at 02/21/17 [phone]     Discharge Medications: Please see discharge summary for a list of discharge medications.  Relevant Imaging Results:  Relevant Lab Results:   Additional Information SS# [id number]  Judi Cong, LCSW

## 2017-02-21 NOTE — Progress Notes (Signed)
Family members present at bedside this time and introduced themselves as healthcare workers. All questions has been answered by this Clinical research associate. One of the family members (who was an MD) requested if pt can be given Melatonin tonight. Paged and spoke to Dr. Anne Hahn and ordered for Ramelteon (similar to Melatonin) 8mg  PO to be given daily at bedtime. Will administer as ordered.

## 2017-02-22 LAB — BASIC METABOLIC PANEL
ANION GAP: 8 (ref 5–15)
BUN: 12 mg/dL (ref 6–20)
CALCIUM: 8.5 mg/dL — AB (ref 8.9–10.3)
CO2: 29 mmol/L (ref 22–32)
Chloride: 88 mmol/L — ABNORMAL LOW (ref 101–111)
Creatinine, Ser: 0.51 mg/dL (ref 0.44–1.00)
Glucose, Bld: 153 mg/dL — ABNORMAL HIGH (ref 65–99)
Potassium: 2.9 mmol/L — ABNORMAL LOW (ref 3.5–5.1)
Sodium: 125 mmol/L — ABNORMAL LOW (ref 135–145)

## 2017-02-22 LAB — MAGNESIUM: MAGNESIUM: 1.7 mg/dL (ref 1.7–2.4)

## 2017-02-22 MED ORDER — POTASSIUM CHLORIDE IN NACL 40-0.9 MEQ/L-% IV SOLN
INTRAVENOUS | Status: DC
  Administered 2017-02-22: 100 mL/h via INTRAVENOUS
  Filled 2017-02-22 (×3): qty 1000

## 2017-02-22 MED ORDER — POTASSIUM CHLORIDE CRYS ER 20 MEQ PO TBCR
40.0000 meq | EXTENDED_RELEASE_TABLET | ORAL | Status: AC
  Administered 2017-02-22 (×2): 40 meq via ORAL
  Filled 2017-02-22 (×2): qty 2

## 2017-02-22 MED ORDER — ACETAMINOPHEN 325 MG PO TABS: 650.0000 mg | ORAL_TABLET | Freq: Four times a day (QID) | ORAL | Status: DC | PRN

## 2017-02-22 MED ORDER — OXYCODONE HCL 5 MG PO TABS: 5.0000 mg | ORAL_TABLET | ORAL | Status: DC | PRN

## 2017-02-22 NOTE — Progress Notes (Signed)
[facility] - [place] at [facility]   PATIENT NAME: Kayla Collier    MR#:  [id number]  DATE OF BIRTH:  [birth year]  SUBJECTIVE:  CHIEF COMPLAINT:   Chief Complaint  Patient presents with  . Toe Pain   Pain improved. Afebrile.  REVIEW OF SYSTEMS:    Review of Systems  Constitutional: Negative for chills and fever.  HENT: Negative for sore throat.   Eyes: Negative for blurred vision, double vision and pain.  Respiratory: Negative for cough, hemoptysis, shortness of breath and wheezing.   Cardiovascular: Negative for chest pain, palpitations, orthopnea and leg swelling.  Gastrointestinal: Negative for abdominal pain, constipation, diarrhea, heartburn, nausea and vomiting.  Genitourinary: Negative for dysuria and hematuria.  Musculoskeletal: Positive for joint pain. Negative for back pain.  Skin: Negative for rash.  Neurological: Negative for sensory change, speech change, focal weakness and headaches.  Endo/Heme/Allergies: Does not bruise/bleed easily.  Psychiatric/Behavioral: Negative for depression. The patient is not nervous/anxious.     DRUG ALLERGIES:   Allergies  Allergen Reactions  . Vicodin [Hydrocodone-Acetaminophen] Other (See Comments)    hallucinations    VITALS:  Blood pressure (!) 159/46, pulse 66, temperature 98.5 F (36.9 C), temperature source Oral, resp. rate 18, height 5' (1.524 m), weight 44.5 kg (98 lb 1.6 oz), SpO2 100 %.  PHYSICAL EXAMINATION:   Physical Exam  GENERAL:  83 y.o.-year-old patient lying in the bed with no acute distress.  EYES: Pupils equal, round, reactive to light and accommodation. No scleral icterus. Extraocular muscles intact.  HEENT: Head atraumatic, normocephalic. Oropharynx and nasopharynx clear.  NECK:  Supple, no jugular venous distention. No thyroid enlargement, no tenderness.  LUNGS: Normal breath sounds bilaterally, no wheezing, rales, rhonchi. No use of accessory muscles of respiration.   CARDIOVASCULAR: S1, S2 normal. No murmurs, rubs, or gallops.  ABDOMEN: Soft, nontender, nondistended. Bowel sounds present. No organomegaly or mass.  EXTREMITIES: Left foot 124 process seem infected. Foul-smelling. No discharge. Tenderness and warmth in the plantar area of the foot. NEUROLOGIC: Cranial nerves II through XII are intact. No focal Motor or sensory deficits b/l.   PSYCHIATRIC: The patient is alert and oriented x 3.  SKIN: No obvious rash, lesion, or ulcer.   LABORATORY PANEL:   CBC  Recent Labs Lab 02/20/17 0430  WBC 7.9  HGB 10.6*  HCT 31.7*  PLT 318   ------------------------------------------------------------------------------------------------------------------ Chemistries   Recent Labs Lab 02/22/17 1014  NA 125*  K 2.9*  CL 88*  CO2 29  GLUCOSE 153*  BUN 12  CREATININE 0.51  CALCIUM 8.5*   ------------------------------------------------------------------------------------------------------------------  Cardiac Enzymes No results for input(s): TROPONINI in the last 168 hours. ------------------------------------------------------------------------------------------------------------------  RADIOLOGY:  No results found.   ASSESSMENT AND PLAN:   * Left foot infection with PAD - Improving On IV abx Seen by Dr. Evie Lacks of vascular surgery. Advised IV antibiotics. Area will likely demarcate. No need for surgery at this time. On Neurontin Consulted physical therapy On oxycodone  * Hypertension. Continue home medications  * History of seizures. On phenytoin  All the records are reviewed and case discussed with Care Management/Social Worker Management plans discussed with the patient, family and they are in agreement.  CODE STATUS: DNR  DVT Prophylaxis: SCDs  TOTAL TIME TAKING CARE OF THIS PATIENT: 30 minutes.   POSSIBLE D/C IN 1-2 DAYS, DEPENDING ON CLINICAL CONDITION.  Milagros Loll R M.D on 02/22/2017 at 12:55 PM  Between 7am to 6pm -  Pager - [phone]  After 6pm go  to [email or web address] - password [email or web address] [facility]  [facility] [place] [facility]  Office  [phone]  CC: Primary care physician; Redmond Baseman, MD  Note: This dictation was prepared with Dragon dictation along with smaller phrase technology. Any transcriptional errors that result from this process are unintentional.

## 2017-02-22 NOTE — Progress Notes (Signed)
Patient BP 150/40, Md notified and ordered for nurse to hold 1400 dose of hydralazine.  Harvie Heck, RN

## 2017-02-22 NOTE — Clinical Social Work Note (Signed)
Clinical Social Work Assessment  Patient Details  Name: Kayla Collier MRN: [id number] Date of Birth: 27-Aug-1918  Date of referral:  02/22/17               Reason for consult:  Facility Placement                Permission sought to share information with:  Kayla Collier granted to share information::  Yes, Verbal Permission Granted  Name::      Kayla Collier::   [name]   Relationship::     Contact Information:     Housing/Transportation Living arrangements for the past 2 months:  [name] of Information:  Patient, Adult Children Patient Interpreter Needed:  None Criminal Activity/Legal Involvement Pertinent to Current Situation/Hospitalization:  No - Comment as needed Significant Relationships:  Adult Children, Other Family Members Lives with:  Adult Children Do you feel safe going back to the place where you live?  Yes Need for family participation in patient care:  Yes (Comment)  Care giving concerns:  Patient lives with her daughter [place]la Col[place] in [place]     Social Worker assessment / plan:  Holiday representative (CSW) received SNF consult. PT is recommending SNF. CSW contacted patient's daughter Kayla Collier. Per daughter patient lives with her in [place] and is paying privately for an aide to come 4 hours per day. CSW explained that PT is recommending SNF and patient has met the 3 night inpatient stay criteria for medicare to pay for SNF. Patient was admitted to inpatient on 02/19/17. Daughter is agreeable to SNF search in [place]. CSW presented bed offers to daughter. Daughter toured a few facilities today. Per daughter it is between [place]. Daughter reported that if she can get patient on the private rehab hall in [place] she would prefer that. Patient is also agreeable to SNF. CSW left a voicemail for admissions coordinator at [place]. Per  MD patient will likely be stable for D/C tomorrow. CSW will continue to follow and assist as needed.   Employment status:  Retired Forensic scientist:  Medicare PT Recommendations:  [place] / Referral to community resources:  [place]  Patient/Family's Response to care:  Patient's daughter is agreeable to SNF.   Patient/Family's Understanding of and Emotional Response to Diagnosis, Current Treatment, and Prognosis:  Patient and her daughter were very pleasant and thanked CSW for assistance.   Emotional Assessment Appearance:  Appears stated age Attitude/Demeanor/Rapport:    Affect (typically observed):  Accepting, Adaptable, Pleasant Orientation:  Oriented to Self, Oriented to Place, Fluctuating Orientation (Suspected and/or reported Sundowners), Oriented to  Time Alcohol / Substance use:  Not Applicable Psych involvement (Current and /or in the community):  No (Comment)  Discharge Needs  Concerns to be addressed:  Discharge Planning Concerns Readmission within the last 30 days:  No Current discharge risk:  Dependent with Mobility Barriers to Discharge:  Continued Medical Work up   UAL Collier, Kayla Beets, LCSW 02/22/2017, 4:03 PM

## 2017-02-22 NOTE — Progress Notes (Signed)
Patient's daughter Fayne Norrie chose [facility]. Daughter will meet with [facility] admissions coordinator at [facility] tomorrow at 1pm to complete admissions paper work. Plan is for patient to D/C to [facility] tomorrow. CSW will continue to follow and assist as needed.   [employer], LCSW [phone]

## 2017-02-22 NOTE — Plan of Care (Signed)
Problem: Physical Regulation: Goal: Ability to maintain clinical measurements within normal limits will improve Outcome: Progressing Patient not currently displaying any signs of infection other than gangrene great toe. Patient not complaining of any pain. Still receiving IV antibiotics. Afebrile.   Harvie Heck, RN

## 2017-02-22 NOTE — Clinical Social Work Placement (Signed)
   CLINICAL SOCIAL WORK PLACEMENT  NOTE  Date:  02/22/2017  Patient Details  Name: Kayla Collier MRN: [id number] Date of Birth: 09-30-1918  Clinical Social Work is seeking post-discharge placement for this patient at the Skilled  Nursing Facility level of care (*CSW will initial, date and re-position this form in  chart as items are completed):  Yes   Patient/family provided with [facility] Clinical Social Work Department's list of facilities offering this level of care within the geographic area requested by the patient (or if unable, by the patient's family).  Yes   Patient/family informed of their freedom to choose among providers that offer the needed level of care, that participate in Medicare, Medicaid or managed care program needed by the patient, have an available bed and are willing to accept the patient.  Yes   Patient/family informed of [facility]'s ownership interest in [facility] and [facility], as well as of the fact that they are under no obligation to receive care at these facilities.  PASRR submitted to EDS on 02/21/17     PASRR number received on 02/21/17     Existing PASRR number confirmed on       FL2 transmitted to all facilities in geographic area requested by pt/family on 02/21/17     FL2 transmitted to all facilities within larger geographic area on       Patient informed that his/her managed care company has contracts with or will negotiate with certain facilities, including the following:        Yes   Patient/family informed of bed offers received.  Patient chooses bed at       Physician recommends and patient chooses bed at      Patient to be transferred to   on  .  Patient to be transferred to facility by       Patient family notified on   of transfer.  Name of family member notified:        PHYSICIAN       Additional Comment:    _______________________________________________ Terree Gaultney, Darleen Crocker, LCSW 02/22/2017, 4:02 PM

## 2017-02-23 DIAGNOSIS — M1990 Unspecified osteoarthritis, unspecified site: Secondary | ICD-10-CM | POA: Diagnosis not present

## 2017-02-23 DIAGNOSIS — I96 Gangrene, not elsewhere classified: Secondary | ICD-10-CM | POA: Diagnosis not present

## 2017-02-23 DIAGNOSIS — R2689 Other abnormalities of gait and mobility: Secondary | ICD-10-CM | POA: Diagnosis not present

## 2017-02-23 DIAGNOSIS — R569 Unspecified convulsions: Secondary | ICD-10-CM | POA: Diagnosis not present

## 2017-02-23 DIAGNOSIS — R6889 Other general symptoms and signs: Secondary | ICD-10-CM | POA: Diagnosis not present

## 2017-02-23 DIAGNOSIS — I70262 Atherosclerosis of native arteries of extremities with gangrene, left leg: Secondary | ICD-10-CM | POA: Diagnosis not present

## 2017-02-23 DIAGNOSIS — I739 Peripheral vascular disease, unspecified: Secondary | ICD-10-CM | POA: Diagnosis not present

## 2017-02-23 DIAGNOSIS — F419 Anxiety disorder, unspecified: Secondary | ICD-10-CM | POA: Diagnosis not present

## 2017-02-23 DIAGNOSIS — G40909 Epilepsy, unspecified, not intractable, without status epilepticus: Secondary | ICD-10-CM | POA: Diagnosis not present

## 2017-02-23 DIAGNOSIS — Z7401 Bed confinement status: Secondary | ICD-10-CM | POA: Diagnosis not present

## 2017-02-23 DIAGNOSIS — M6281 Muscle weakness (generalized): Secondary | ICD-10-CM | POA: Diagnosis not present

## 2017-02-23 DIAGNOSIS — M069 Rheumatoid arthritis, unspecified: Secondary | ICD-10-CM | POA: Diagnosis not present

## 2017-02-23 DIAGNOSIS — R278 Other lack of coordination: Secondary | ICD-10-CM | POA: Diagnosis not present

## 2017-02-23 DIAGNOSIS — F411 Generalized anxiety disorder: Secondary | ICD-10-CM | POA: Diagnosis not present

## 2017-02-23 DIAGNOSIS — I10 Essential (primary) hypertension: Secondary | ICD-10-CM | POA: Diagnosis not present

## 2017-02-23 LAB — BASIC METABOLIC PANEL
ANION GAP: 7 (ref 5–15)
BUN: 15 mg/dL (ref 6–20)
CALCIUM: 8.4 mg/dL — AB (ref 8.9–10.3)
CO2: 27 mmol/L (ref 22–32)
Chloride: 94 mmol/L — ABNORMAL LOW (ref 101–111)
Creatinine, Ser: 0.75 mg/dL (ref 0.44–1.00)
GFR calc Af Amer: >60 mL/min (ref >60)
GFR calc non Af Amer: >60 mL/min (ref >60)
GLUCOSE: 104 mg/dL — AB (ref 65–99)
Potassium: 4.6 mmol/L (ref 3.5–5.1)
Sodium: 128 mmol/L — ABNORMAL LOW (ref 135–145)

## 2017-02-23 MED ORDER — HYDRALAZINE HCL 25 MG PO TABS: 25.0000 mg | ORAL_TABLET | Freq: Three times a day (TID) | ORAL | Status: DC

## 2017-02-23 MED ORDER — AMOXICILLIN-POT CLAVULANATE 500-125 MG PO TABS: 1.0000 | ORAL_TABLET | Freq: Two times a day (BID) | ORAL | 0 refills | Status: DC

## 2017-02-23 MED ORDER — OXYCODONE HCL 5 MG PO TABS: 5.0000 mg | ORAL_TABLET | ORAL | 0 refills | Status: DC | PRN

## 2017-02-23 MED ORDER — AMOXICILLIN-POT CLAVULANATE 875-125 MG PO TABS: 1.0000 | ORAL_TABLET | Freq: Two times a day (BID) | ORAL | 0 refills | Status: DC

## 2017-02-23 NOTE — Progress Notes (Signed)
RN called report to [employer] at [facility]. All questions answered. Patient alert and oriented and aware of transport. Patient dressed in personal night gown. No pain at this time. EMS will be called at 1pm for transport. Family aware.   Harvie Heck, RN

## 2017-02-23 NOTE — Discharge Instructions (Signed)
Regular diet and activity as tolerated with assistance and full weight bearing. Dry dressing to left foot daily

## 2017-02-23 NOTE — Progress Notes (Signed)
Patient is medically stable for D/C to [facility] in [place] today. Per [facility] admissions coordinator at [facility] patient can come today after 1:30 to room 3209. RN will call report and arrange EMS for transport. Clinical Child psychotherapist (CSW) sent D/C orders to [facility] via [place]. Patient is aware of above. CSW contacted patient's daughter Misty Stanley and made her aware of above. Per Misty Stanley she is meeting Janie at 1 to complete admissions paper work. Please reconsult if future social work needs arise. CSW signing off.   [employer], LCSW [phone]

## 2017-02-23 NOTE — Discharge Summary (Addendum)
[facility] - [place] [facility]   PATIENT NAME: Kayla Collier    MR#:  [id number]  DATE OF BIRTH:  08-31-1918  DATE OF ADMISSION:  02/19/2017 ADMITTING PHYSICIAN: Oralia Manis, MD  DATE OF DISCHARGE: 02/23/2017  PRIMARY CARE PHYSICIAN: Ileana Ladd, MD   ADMISSION DIAGNOSIS:  Ischemic toe [I99.8]  DISCHARGE DIAGNOSIS:  Principal Problem:   Gangrene of foot (HCC) Active Problems:   HTN (hypertension)   Anxiety   Seizures (HCC)   SECONDARY DIAGNOSIS:   Past Medical History:  Diagnosis Date  . Anxiety   . Arthritis    RA  . Hypertension   . Seizures (HCC)      ADMITTING HISTORY  HISTORY OF PRESENT ILLNESS:  Daren Doswell  is a 83 y.o. female who presents with left toe pain and discharge.  Patient began having toe pain two days ago, and has had increasing symptoms since then, including some purulent discharge.  Patient does not contribute much to her HPI, and information is provided by family at bedside.  Imaging shows soft tissue swelling without clear signs of osteomyelitis.  Hospitalists were called for admission   HOSPITAL COURSE:   * Left foot infection with PAD - Improving On IV abx with vancomycin and zosyn in hospital Switch to augmentin at discharge Seen by Dr. Evie Lacks of vascular surgery. Advised IV antibiotics. Area will likely demarcate. No need for surgery/angiogram at this time. On Neurontin, oxycodone Consulted physical therapy and SNF recommended Change dry dressing daily  * Hypertension. Continue home medications Norvasc, atenolol, hydralazine  * History of seizures. On phenytoin  * MIld hyponatremia and hypokalemia have improved  Stable for discharge home  CONSULTS OBTAINED:  Treatment Team:  Bertram Denver, MD  DRUG ALLERGIES:   Allergies  Allergen Reactions  . Vicodin [Hydrocodone-Acetaminophen] Other (See Comments)    hallucinations    DISCHARGE MEDICATIONS:   Current Discharge Medication List    START  taking these medications   Details  amoxicillin-clavulanate (AUGMENTIN) 500-125 MG tablet Take 1 tablet (500 mg total) by mouth 2 (two) times daily. Qty: 12 tablet, Refills: 0    hydrALAZINE (APRESOLINE) 25 MG tablet Take 1 tablet (25 mg total) by mouth 3 (three) times daily.    oxyCODONE (OXY IR/ROXICODONE) 5 MG immediate release tablet Take 1 tablet (5 mg total) by mouth every 4 (four) hours as needed for severe pain. Qty: 30 tablet, Refills: 0      CONTINUE these medications which have NOT CHANGED   Details  acetaminophen (TYLENOL) 500 MG tablet Take 1,000 mg by mouth 2 (two) times daily.    amLODipine (NORVASC) 5 MG tablet Take 5 mg by mouth daily.    aspirin EC 81 MG tablet Take 81 mg by mouth daily.    atenolol (TENORMIN) 50 MG tablet Take 50 mg by mouth daily.    cholecalciferol (VITAMIN D) 1000 units tablet Take 1,000 Units by mouth daily.    gabapentin (NEURONTIN) 100 MG capsule Take 100 mg by mouth 3 (three) times daily.    Misc Natural Products (TART CHERRY ADVANCED) CAPS Take 1 capsule by mouth daily.    !! phenytoin (DILANTIN) 100 MG ER capsule Take 100 mg by mouth at bedtime.     !! phenytoin (DILANTIN) 30 MG ER capsule Take 30 mg by mouth 3 (three) times a week. Take at bedtime with 100mg  on Monday, Wednesday, and Friday    potassium chloride (MICRO-K) 10 MEQ CR capsule Take 10 mEq by mouth  daily.     vitamin B-12 (CYANOCOBALAMIN) 1000 MCG tablet Take 1,000 mcg by mouth daily.    vitamin E (VITAMIN E) 400 UNIT capsule Take 400 Units by mouth daily.     !! - Potential duplicate medications found. Please discuss with provider.    STOP taking these medications     traMADol (ULTRAM) 50 MG tablet         Today   VITAL SIGNS:  Blood pressure (!) 188/72, pulse 72, temperature 98.7 F (37.1 C), temperature source Oral, resp. rate 16, height 5' (1.524 m), weight 44.5 kg (98 lb 1.6 oz), SpO2 100 %.  I/O:   Intake/Output Summary (Last 24 hours) at 02/23/17  1023 Last data filed at 02/23/17 0800  Gross per 24 hour  Intake             2730 ml  Output                3 ml  Net             2727 ml    PHYSICAL EXAMINATION:  Physical Exam  GENERAL:  83 y.o.-year-old patient lying in the bed with no acute distress.  LUNGS: Normal breath sounds bilaterally, no wheezing, rales,rhonchi or crepitation. No use of accessory muscles of respiration.  CARDIOVASCULAR: S1, S2 normal. No murmurs, rubs, or gallops.  ABDOMEN: Soft, non-tender, non-distended. Bowel sounds present. No organomegaly or mass.  NEUROLOGIC: Moves all 4 extremities. PSYCHIATRIC: The patient is alert and awake Left foot 1-4 toes tender and superficial ulcers. Early gangrene  DATA REVIEW:   CBC  Recent Labs Lab 02/20/17 0430  WBC 7.9  HGB 10.6*  HCT 31.7*  PLT 318    Chemistries   Recent Labs Lab 02/22/17 1014 02/23/17 0447  NA 125* 128*  K 2.9* 4.6  CL 88* 94*  CO2 29 27  GLUCOSE 153* 104*  BUN 12 15  CREATININE 0.51 0.75  CALCIUM 8.5* 8.4*  MG 1.7  --     Cardiac Enzymes No results for input(s): TROPONINI in the last 168 hours.  Microbiology Results  Results for orders placed or performed during the hospital encounter of 12/31/09  Rapid strep screen     Status: None   Collection Time: 12/31/09 10:47 AM  Result Value Ref Range Status   Streptococcus, Group A Screen (Direct) NEGATIVE NEGATIVE Final  Wound culture     Status: None   Collection Time: 12/31/09  4:59 PM  Result Value Ref Range Status   Specimen Description WOUND DRAINAGE MOUTH/RIGHT SUBMANDIBULAR INFECTION  Final   Special Requests wound  Final   Gram Stain   Final    FEW WBC PRESENT,BOTH PMN AND MONONUCLEAR NO SQUAMOUS EPITHELIAL CELLS SEEN RARE GRAM NEGATIVE RODS RARE GRAM POSITIVE COCCI IN PAIRS   Culture   Final    MULTIPLE ORGANISMS PRESENT, NONE PREDOMINANT Note: NO STAPHYLOCOCCUS AUREUS ISOLATED NO GROUP A STREP (S.PYOGENES) ISOLATED   Report Status 01/03/2010 FINAL  Final   MRSA PCR Screening     Status: None   Collection Time: 01/01/10 12:35 AM  Result Value Ref Range Status   MRSA by PCR  NEGATIVE Final    NEGATIVE        The GeneXpert MRSA Assay (FDA approved for NASAL specimens only), is one component of a comprehensive MRSA colonization surveillance program. It is not intended to diagnose MRSA infection nor to guide or monitor treatment for MRSA infections.    RADIOLOGY:  No results found.  Follow up with PCP in 1 week.  Management plans discussed with the patient, family and they are in agreement.  CODE STATUS:     Code Status Orders        Start     Ordered   02/20/17 0052  Do not attempt resuscitation (DNR)  Continuous    Question Answer Comment  In the event of cardiac or respiratory ARREST Do not call a "code blue"   In the event of cardiac or respiratory ARREST Do not perform Intubation, CPR, defibrillation or ACLS   In the event of cardiac or respiratory ARREST Use medication by any route, position, wound care, and other measures to relive pain and suffering. May use oxygen, suction and manual treatment of airway obstruction as needed for comfort.      02/20/17 0051    Code Status History    Date Active Date Inactive Code Status Order ID Comments User Context   This patient has a current code status but no historical code status.    Advance Directive Documentation     Most Recent Value  Type of Advance Directive  Living will, Healthcare Power of Attorney  Pre-existing out of facility DNR order (yellow form or pink MOST form)  -  "MOST" Form in Place?  -      TOTAL TIME TAKING CARE OF THIS PATIENT ON DAY OF DISCHARGE: more than 30 minutes.   Milagros Loll R M.D on 02/23/2017 at 10:23 AM  Between 7am to 6pm - Pager - [phone]  After 6pm go to [email or web address] - password [email or web address] [facility]  [facility]  Office  [phone]  CC: Primary care physician; Ileana Ladd, MD  Note: This dictation was  prepared with Dragon dictation along with smaller phrase technology. Any transcriptional errors that result from this process are unintentional.

## 2017-02-23 NOTE — Clinical Social Work Placement (Signed)
   CLINICAL SOCIAL WORK PLACEMENT  NOTE  Date:  02/23/2017  Patient Details  Name: Kayla Collier MRN: [id number] Date of Birth: 1917-11-17  Clinical Social Work is seeking post-discharge placement for this patient at the Skilled  Nursing Facility level of care (*CSW will initial, date and re-position this form in  chart as items are completed):  Yes   Patient/family provided with [facility] Clinical Social Work Department's list of facilities offering this level of care within the geographic area requested by the patient (or if unable, by the patient's family).  Yes   Patient/family informed of their freedom to choose among providers that offer the needed level of care, that participate in Medicare, Medicaid or managed care program needed by the patient, have an available bed and are willing to accept the patient.  Yes   Patient/family informed of [facility]'s ownership interest in [facility] and [facility], as well as of the fact that they are under no obligation to receive care at these facilities.  PASRR submitted to EDS on 02/21/17     PASRR number received on 02/21/17     Existing PASRR number confirmed on       FL2 transmitted to all facilities in geographic area requested by pt/family on 02/21/17     FL2 transmitted to all facilities within larger geographic area on       Patient informed that his/her managed care company has contracts with or will negotiate with certain facilities, including the following:        Yes   Patient/family informed of bed offers received.  Patient chooses bed at  [facility] )     Physician recommends and patient chooses bed at      Patient to be transferred to  [facility] ) on 02/23/17.  Patient to be transferred to facility by  [facility] )     Patient family notified on 02/23/17 of transfer.  Name of family member notified:   (Patient's daughter Fayne Norrie is aware of D/C today. )     PHYSICIAN        Additional Comment:    _______________________________________________ Meg Niemeier, Darleen Crocker, LCSW 02/23/2017, 11:19 AM

## 2017-02-23 NOTE — Progress Notes (Signed)
EMS here for pick up. Daughter notified.   Harvie Heck, RN

## 2017-02-24 DIAGNOSIS — I10 Essential (primary) hypertension: Secondary | ICD-10-CM | POA: Diagnosis not present

## 2017-02-24 DIAGNOSIS — I96 Gangrene, not elsewhere classified: Secondary | ICD-10-CM | POA: Diagnosis not present

## 2017-02-24 DIAGNOSIS — G40909 Epilepsy, unspecified, not intractable, without status epilepticus: Secondary | ICD-10-CM | POA: Diagnosis not present

## 2017-02-24 DIAGNOSIS — F419 Anxiety disorder, unspecified: Secondary | ICD-10-CM | POA: Diagnosis not present

## 2017-02-26 DIAGNOSIS — M1990 Unspecified osteoarthritis, unspecified site: Secondary | ICD-10-CM | POA: Diagnosis not present

## 2017-02-26 DIAGNOSIS — I96 Gangrene, not elsewhere classified: Secondary | ICD-10-CM | POA: Diagnosis not present

## 2017-02-26 DIAGNOSIS — R569 Unspecified convulsions: Secondary | ICD-10-CM | POA: Diagnosis not present

## 2017-02-26 DIAGNOSIS — M069 Rheumatoid arthritis, unspecified: Secondary | ICD-10-CM | POA: Diagnosis not present

## 2017-02-26 DIAGNOSIS — I10 Essential (primary) hypertension: Secondary | ICD-10-CM | POA: Diagnosis not present

## 2017-02-26 DIAGNOSIS — F411 Generalized anxiety disorder: Secondary | ICD-10-CM | POA: Diagnosis not present

## 2017-02-26 DIAGNOSIS — I739 Peripheral vascular disease, unspecified: Secondary | ICD-10-CM | POA: Diagnosis not present

## 2017-03-01 DIAGNOSIS — G40909 Epilepsy, unspecified, not intractable, without status epilepticus: Secondary | ICD-10-CM | POA: Diagnosis not present

## 2017-03-01 DIAGNOSIS — I96 Gangrene, not elsewhere classified: Secondary | ICD-10-CM | POA: Diagnosis not present

## 2017-03-01 DIAGNOSIS — I10 Essential (primary) hypertension: Secondary | ICD-10-CM | POA: Diagnosis not present

## 2017-03-01 DIAGNOSIS — F419 Anxiety disorder, unspecified: Secondary | ICD-10-CM | POA: Diagnosis not present

## 2017-03-08 DIAGNOSIS — G40909 Epilepsy, unspecified, not intractable, without status epilepticus: Secondary | ICD-10-CM | POA: Diagnosis not present

## 2017-03-08 DIAGNOSIS — I10 Essential (primary) hypertension: Secondary | ICD-10-CM | POA: Diagnosis not present

## 2017-03-08 DIAGNOSIS — M1990 Unspecified osteoarthritis, unspecified site: Secondary | ICD-10-CM | POA: Diagnosis not present

## 2017-03-08 DIAGNOSIS — I96 Gangrene, not elsewhere classified: Secondary | ICD-10-CM | POA: Diagnosis not present

## 2017-03-09 ENCOUNTER — Ambulatory Visit (INDEPENDENT_AMBULATORY_CARE_PROVIDER_SITE_OTHER): Payer: Medicare Other | Admitting: Vascular Surgery

## 2017-03-09 ENCOUNTER — Encounter (INDEPENDENT_AMBULATORY_CARE_PROVIDER_SITE_OTHER): Payer: Self-pay | Admitting: Vascular Surgery

## 2017-03-09 VITALS — BP 199/80 | HR 70 | Resp 16 | Ht 60.0 in | Wt 100.0 lb

## 2017-03-09 DIAGNOSIS — I96 Gangrene, not elsewhere classified: Secondary | ICD-10-CM | POA: Diagnosis not present

## 2017-03-09 DIAGNOSIS — R569 Unspecified convulsions: Secondary | ICD-10-CM | POA: Diagnosis not present

## 2017-03-09 DIAGNOSIS — I10 Essential (primary) hypertension: Secondary | ICD-10-CM

## 2017-03-09 DIAGNOSIS — I70262 Atherosclerosis of native arteries of extremities with gangrene, left leg: Secondary | ICD-10-CM | POA: Diagnosis not present

## 2017-03-09 NOTE — Assessment & Plan Note (Signed)
Stable on medications

## 2017-03-09 NOTE — Assessment & Plan Note (Signed)
blood pressure control important in reducing the progression of atherosclerotic disease. On appropriate oral medications.  

## 2017-03-09 NOTE — Assessment & Plan Note (Signed)
This is clearly a critical limb threatening situation. I suspect her vascular status is poor by her clinical exam and the appearance of the foot. Continue local wound care. Angiogram to be scheduled as soon as possible at the patient's convenience. I have had a long discussion with the patient and her caregiver regarding the grave nature of the situation. I have told her that limb loss or death is certainly a possibility. I think it best case scenario she will lose several toes. This would be even with successful revascularization. I discussed the natural history and the pathophysiology of peripheral arterial disease in great detail with them as well.

## 2017-03-09 NOTE — Progress Notes (Signed)
Patient ID: Kayla Collier, female   DOB: 07-May-1918, 83 y.o.   MRN: [id number]  Chief Complaint  Patient presents with  . New Patient (Initial Visit)    HPI Kayla Collier is a 83 y.o. female.  I am asked to see the patient by Dr. Jannifer Franklin from the ER for evaluation of PAD and gangrenous changes to the left foot. She was seen by a [place] vascular surgeon, who recommended she follow up with [place] for more definitive evaluation and treatment of her peripheral vascular disease.  The patient reports pain in her left foot which has been present for many months. She has had worsening discoloration with black changes to the left great toe and skin ulcerations to the third fourth and fifth toes. These have become more soupy and had a foul odor recently. There was no trauma or injury or inciting event that caused the issues with her left foot. This has been present for months and has been gradually worsening. She has no right leg symptoms. She denies previous surgery or intervention to the left leg. She denies any fever or chills or signs of systemic infection. The pain is a dull aching pain which is pretty much present all of the time in the left foot.   Past Medical History:  Diagnosis Date  . Anxiety   . Arthritis    RA  . Hypertension   . Seizures ([place])     Past Surgical History:  Procedure Laterality Date  . CATARACT EXTRACTION    . TOTAL HIP ARTHROPLASTY      Family History No reported history of bleeding disorders, clotting disorders, autoimmune diseases, or aneurysms  Social History Social History  Substance Use Topics  . Smoking status: Never Smoker  . Smokeless tobacco: Never Used  . Alcohol use No  Lives in a facility  Allergies  Allergen Reactions  . Vicodin [Hydrocodone-Acetaminophen] Other (See Comments)    hallucinations    Current Outpatient Prescriptions  Medication Sig Dispense Refill  . acetaminophen (TYLENOL) 500 MG tablet Take 1,000 mg by mouth 2 (two) times  daily.    Marland Kitchen amLODipine (NORVASC) 5 MG tablet Take 5 mg by mouth daily.    Marland Kitchen aspirin EC 81 MG tablet Take 81 mg by mouth daily.    Marland Kitchen atenolol (TENORMIN) 50 MG tablet Take 50 mg by mouth daily.    . cholecalciferol (VITAMIN D) 1000 units tablet Take 1,000 Units by mouth daily.    Marland Kitchen gabapentin (NEURONTIN) 100 MG capsule Take 100 mg by mouth 3 (three) times daily.    . hydrALAZINE (APRESOLINE) 25 MG tablet Take 1 tablet (25 mg total) by mouth 3 (three) times daily.    . Misc Natural Products (TART CHERRY ADVANCED) CAPS Take 1 capsule by mouth daily.    Marland Kitchen oxyCODONE (OXY IR/ROXICODONE) 5 MG immediate release tablet Take 1 tablet (5 mg total) by mouth every 4 (four) hours as needed for severe pain. 30 tablet 0  . phenytoin (DILANTIN) 100 MG ER capsule Take 100 mg by mouth at bedtime.     . phenytoin (DILANTIN) 30 MG ER capsule Take 30 mg by mouth 3 (three) times a week. Take at bedtime with '100mg'$  on Monday, Wednesday, and Friday    . potassium chloride (MICRO-K) 10 MEQ CR capsule Take 10 mEq by mouth daily.     . vitamin B-12 (CYANOCOBALAMIN) 1000 MCG tablet Take 1,000 mcg by mouth daily.    . vitamin E (VITAMIN E) 400 UNIT capsule  Take 400 Units by mouth daily.    Marland Kitchen amoxicillin-clavulanate (AUGMENTIN) 500-125 MG tablet Take 1 tablet (500 mg total) by mouth 2 (two) times daily. (Patient not taking: Reported on 03/09/2017) 12 tablet 0   No current facility-administered medications for this visit.       REVIEW OF SYSTEMS (Negative unless checked)  Constitutional: '[]'$ Weight loss  '[]'$ Fever  '[]'$ Chills Cardiac: '[]'$ Chest pain   '[]'$ Chest pressure   '[]'$ Palpitations   '[]'$ Shortness of breath when laying flat   '[]'$ Shortness of breath at rest   '[]'$ Shortness of breath with exertion. Vascular:  '[]'$ Pain in legs with walking   '[]'$ Pain in legs at rest   '[]'$ Pain in legs when laying flat   '[]'$ Claudication   '[]'$ Pain in feet when walking  '[x]'$ Pain in feet at rest  '[x]'$ Pain in feet when laying flat   '[]'$ History of DVT   '[]'$ Phlebitis    '[]'$ Swelling in legs   '[]'$ Varicose veins   '[x]'$ Non-healing ulcers Pulmonary:   '[]'$ Uses home oxygen   '[]'$ Productive cough   '[]'$ Hemoptysis   '[]'$ Wheeze  '[]'$ COPD   '[]'$ Asthma Neurologic:  '[]'$ Dizziness  '[]'$ Blackouts   '[x]'$ Seizures   '[]'$ History of stroke   '[]'$ History of TIA  '[]'$ Aphasia   '[]'$ Temporary blindness   '[]'$ Dysphagia   '[]'$ Weakness or numbness in arms   '[]'$ Weakness or numbness in legs Musculoskeletal:  '[]'$ Arthritis   '[]'$ Joint swelling   '[]'$ Joint pain   '[]'$ Low back pain Hematologic:  '[]'$ Easy bruising  '[]'$ Easy bleeding   '[]'$ Hypercoagulable state   '[]'$ Anemic  '[]'$ Hepatitis Gastrointestinal:  '[]'$ Blood in stool   '[]'$ Vomiting blood  '[]'$ Gastroesophageal reflux/heartburn   '[]'$ Abdominal pain Genitourinary:  '[]'$ Chronic kidney disease   '[]'$ Difficult urination  '[]'$ Frequent urination  '[]'$ Burning with urination   '[]'$ Hematuria Skin:  '[]'$ Rashes   '[x]'$ Ulcers   '[x]'$ Wounds Psychological:  '[x]'$ History of anxiety   '[]'$  History of major depression.    Physical Exam BP (!) 199/80   Pulse 70   Resp 16   Ht 5' (1.524 m)   Wt 100 lb (45.4 kg)   BMI 19.53 kg/m  Gen:  WD/WN, NAD. Appears younger than stated age Head: /AT, No temporalis wasting.  Ear/Nose/Throat: Hearing decreased, nares w/o erythema or drainage, oropharynx w/o Erythema/Exudate Eyes: Conjunctiva clear, sclera non-icteric  Neck: trachea midline.  No JVD.  Pulmonary:  Good air movement, no use of accessory muscles, respirations not labored Cardiac: Somewhat irregular Vascular:  Vessel Right Left  Radial Palpable Palpable  Ulnar Palpable Palpable  Brachial Palpable Palpable  Carotid Palpable, without bruit Palpable, without bruit  Aorta Not palpable N/A  Femoral Palpable Palpable  Popliteal Not Palpable Not Palpable  PT Not Palpable Not Palpable  DP 1+ Palpable Not Palpable   Gastrointestinal: soft, non-tender/non-distended.  Musculoskeletal: Uses a wheelchair. Right foot is reasonably warm with good capillary refill. Left foot is a little cooler and has black discoloration of the  left great toe and draining ulcerations on in between toes 3, 4, and 5. There is mild surrounding erythema. The foot has poor capillary refill. Neurologic: Sensation grossly intact in extremities.  Symmetrical.  Speech is fluent.  Psychiatric: Judgment and insight seem reasonably good particularly given her advanced age.  Dermatologic: Wounds on the left foot as described above   Radiology Dg Foot Complete Left  Result Date: 02/19/2017 CLINICAL DATA:  Pain in the left great toe for the last several weeks. Some drainage. EXAM: LEFT FOOT - COMPLETE 3+ VIEW COMPARISON:  None. FINDINGS: Distal all soft tissue swelling. No evidence of erosion or lytic change that would  allow diagnosis of osteomyelitis by plain radiography. The remainder of the foot is negative except for some chronic arthritic change of the inter phalangeal joint of the small toe. IMPRESSION: Soft tissue swelling of the distal great toe. No underlying bone abnormality seen. Electronically Signed   By: Paulina Fusi M.D.   On: 02/19/2017 22:13    Labs Recent Results (from the past 2160 hour(s))  Basic metabolic panel     Status: Abnormal   Collection Time: 02/19/17  6:54 PM  Result Value Ref Range   Sodium 132 (L) 135 - 145 mmol/L   Potassium 4.2 3.5 - 5.1 mmol/L   Chloride 94 (L) 101 - 111 mmol/L   CO2 29 22 - 32 mmol/L   Glucose, Bld 120 (H) 65 - 99 mg/dL   BUN 17 6 - 20 mg/dL   Creatinine, Ser 8.20 0.44 - 1.00 mg/dL   Calcium 8.8 (L) 8.9 - 10.3 mg/dL   GFR calc non Af Amer >60 >60 mL/min   GFR calc Af Amer >60 >60 mL/min    Comment: (NOTE) The eGFR has been calculated using the CKD EPI equation. This calculation has not been validated in all clinical situations. eGFR's persistently <60 mL/min signify possible Chronic Kidney Disease.    Anion gap 9 5 - 15  CBC     Status: Abnormal   Collection Time: 02/19/17  6:54 PM  Result Value Ref Range   WBC 7.2 3.6 - 11.0 K/uL   RBC 3.48 (L) 3.80 - 5.20 MIL/uL   Hemoglobin  10.7 (L) 12.0 - 16.0 g/dL   HCT 03.4 (L) 34.9 - 41.4 %   MCV 90.8 80.0 - 100.0 fL   MCH 30.8 26.0 - 34.0 pg   MCHC 33.9 32.0 - 36.0 g/dL   RDW 44.0 73.5 - 99.7 %   Platelets 306 150 - 440 K/uL  Basic metabolic panel     Status: Abnormal   Collection Time: 02/20/17  4:30 AM  Result Value Ref Range   Sodium 132 (L) 135 - 145 mmol/L   Potassium 3.5 3.5 - 5.1 mmol/L   Chloride 95 (L) 101 - 111 mmol/L   CO2 31 22 - 32 mmol/L   Glucose, Bld 104 (H) 65 - 99 mg/dL   BUN 12 6 - 20 mg/dL   Creatinine, Ser 6.08 0.44 - 1.00 mg/dL   Calcium 8.9 8.9 - 05.1 mg/dL   GFR calc non Af Amer >60 >60 mL/min   GFR calc Af Amer >60 >60 mL/min    Comment: (NOTE) The eGFR has been calculated using the CKD EPI equation. This calculation has not been validated in all clinical situations. eGFR's persistently <60 mL/min signify possible Chronic Kidney Disease.    Anion gap 6 5 - 15  CBC     Status: Abnormal   Collection Time: 02/20/17  4:30 AM  Result Value Ref Range   WBC 7.9 3.6 - 11.0 K/uL   RBC 3.54 (L) 3.80 - 5.20 MIL/uL   Hemoglobin 10.6 (L) 12.0 - 16.0 g/dL   HCT 08.6 (L) 81.4 - 59.5 %   MCV 89.6 80.0 - 100.0 fL   MCH 30.0 26.0 - 34.0 pg   MCHC 33.5 32.0 - 36.0 g/dL   RDW 34.7 72.9 - 15.7 %   Platelets 318 150 - 440 K/uL  Basic metabolic panel     Status: Abnormal   Collection Time: 02/22/17 10:14 AM  Result Value Ref Range   Sodium 125 (L) 135 - 145  mmol/L   Potassium 2.9 (L) 3.5 - 5.1 mmol/L   Chloride 88 (L) 101 - 111 mmol/L   CO2 29 22 - 32 mmol/L   Glucose, Bld 153 (H) 65 - 99 mg/dL   BUN 12 6 - 20 mg/dL   Creatinine, Ser 0.51 0.44 - 1.00 mg/dL   Calcium 8.5 (L) 8.9 - 10.3 mg/dL   GFR calc non Af Amer >60 >60 mL/min   GFR calc Af Amer >60 >60 mL/min    Comment: (NOTE) The eGFR has been calculated using the CKD EPI equation. This calculation has not been validated in all clinical situations. eGFR's persistently <60 mL/min signify possible Chronic Kidney Disease.    Anion gap 8  5 - 15  Magnesium     Status: None   Collection Time: 02/22/17 10:14 AM  Result Value Ref Range   Magnesium 1.7 1.7 - 2.4 mg/dL  Basic metabolic panel     Status: Abnormal   Collection Time: 02/23/17  4:47 AM  Result Value Ref Range   Sodium 128 (L) 135 - 145 mmol/L   Potassium 4.6 3.5 - 5.1 mmol/L   Chloride 94 (L) 101 - 111 mmol/L   CO2 27 22 - 32 mmol/L   Glucose, Bld 104 (H) 65 - 99 mg/dL   BUN 15 6 - 20 mg/dL   Creatinine, Ser 0.75 0.44 - 1.00 mg/dL   Calcium 8.4 (L) 8.9 - 10.3 mg/dL   GFR calc non Af Amer >60 >60 mL/min   GFR calc Af Amer >60 >60 mL/min    Comment: (NOTE) The eGFR has been calculated using the CKD EPI equation. This calculation has not been validated in all clinical situations. eGFR's persistently <60 mL/min signify possible Chronic Kidney Disease.    Anion gap 7 5 - 15    Assessment/Plan:  Seizures (HCC) Stable on medications  HTN (hypertension) blood pressure control important in reducing the progression of atherosclerotic disease. On appropriate oral medications.   Gangrene of foot ([place]) This is clearly a critical limb threatening situation. I suspect her vascular status is poor by her clinical exam and the appearance of the foot. Continue local wound care. Angiogram to be scheduled as soon as possible at the patient's convenience.  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 I suspect her vascular status is poor by her clinical exam and the appearance of the foot. Continue local wound care. Angiogram to be scheduled as soon as possible at the patient's convenience. I have had a long discussion with the patient and her caregiver regarding the grave nature of the situation. I have told her that limb loss or death is certainly a possibility. I think it best case scenario she will lose several toes. This would be even with successful revascularization. I discussed the natural history  and the pathophysiology of peripheral arterial disease in great detail with them as well.      Leotis Pain 03/09/2017, 12:28 PM   This note was created with Dragon medical transcription system.  Any errors from dictation are unintentional.

## 2017-03-09 NOTE — Assessment & Plan Note (Signed)
This is clearly a critical limb threatening situation. I suspect her vascular status is poor by her clinical exam and the appearance of the foot. Continue local wound care. Angiogram to be scheduled as soon as possible at the patient's convenience.

## 2017-03-09 NOTE — Patient Instructions (Signed)
Peripheral Vascular Disease Peripheral vascular disease (PVD) is a disease of the blood vessels that are not part of your heart and brain. A simple term for PVD is poor circulation. In most cases, PVD narrows the blood vessels that carry blood from your heart to the rest of your body. This can result in a decreased supply of blood to your arms, legs, and internal organs, like your stomach or kidneys. However, it most often affects a person's lower legs and feet. There are two types of PVD.  Organic PVD. This is the more common type. It is caused by damage to the structure of blood vessels.  Functional PVD. This is caused by conditions that make blood vessels contract and tighten (spasm).  Without treatment, PVD tends to get worse over time. PVD can also lead to acute ischemic limb. This is when an arm or limb suddenly has trouble getting enough blood. This is a medical emergency. What are the causes? Each type of PVD has many different causes. The most common cause of PVD is buildup of a fatty material (plaque) inside of your arteries (atherosclerosis). Small amounts of plaque can break off from the walls of the blood vessels and become lodged in a smaller artery. This blocks blood flow and can cause acute ischemic limb. Other common causes of PVD include:  Blood clots that form inside of blood vessels.  Injuries to blood vessels.  Diseases that cause inflammation of blood vessels or cause blood vessel spasms.  Health behaviors and health history that increase your risk of developing PVD.  What increases the risk? You may have a greater risk of PVD if you:  Have a family history of PVD.  Have certain medical conditions, including: ? High cholesterol. ? Diabetes. ? High blood pressure (hypertension). ? Coronary heart disease. ? Past problems with blood clots. ? Past injury, such as burns or a broken bone. These may have damaged blood vessels in your limbs. ? Buerger disease. This is  caused by inflamed blood vessels in your hands and feet. ? Some forms of arthritis. ? Rare birth defects that affect the arteries in your legs.  Use tobacco.  Do not get enough exercise.  Are obese.  Are age 50 or older.  What are the signs or symptoms? PVD may cause many different symptoms. Your symptoms depend on what part of your body is not getting enough blood. Some common signs and symptoms include:  Cramps in your lower legs. This may be a symptom of poor leg circulation (claudication).  Pain and weakness in your legs while you are physically active that goes away when you rest (intermittent claudication).  Leg pain when at rest.  Leg numbness, tingling, or weakness.  Coldness in a leg or foot, especially when compared with the other leg.  Skin or hair changes. These can include: ? Hair loss. ? Shiny skin. ? Pale or bluish skin. ? Thick toenails.  Inability to get or maintain an erection (erectile dysfunction).  People with PVD are more prone to developing ulcers and sores on their toes, feet, or legs. These may take longer than normal to heal. How is this diagnosed? Your health care provider may diagnose PVD from your signs and symptoms. The health care provider will also do a physical exam. You may have tests to find out what is causing your PVD and determine its severity. Tests may include:  Blood pressure recordings from your arms and legs and measurements of the strength of your pulses (  pulse volume recordings).  Imaging studies using sound waves to take pictures of the blood flow through your blood vessels (Doppler ultrasound).  Injecting a dye into your blood vessels before having imaging studies using: ? X-rays (angiogram or arteriogram). ? Computer-generated X-rays (CT angiogram). ? A powerful electromagnetic field and a computer (magnetic resonance angiogram or MRA).  How is this treated? Treatment for PVD depends on the cause of your condition and the  severity of your symptoms. It also depends on your age. Underlying causes need to be treated and controlled. These include long-lasting (chronic) conditions, such as diabetes, high cholesterol, and high blood pressure. You may need to first try making lifestyle changes and taking medicines. Surgery may be needed if these do not work. Lifestyle changes may include:  Quitting smoking.  Exercising regularly.  Following a low-fat, low-cholesterol diet.  Medicines may include:  Blood thinners to prevent blood clots.  Medicines to improve blood flow.  Medicines to improve your blood cholesterol levels.  Surgical procedures may include:  A procedure that uses an inflated balloon to open a blocked artery and improve blood flow (angioplasty).  A procedure to put in a tube (stent) to keep a blocked artery open (stent implant).  Surgery to reroute blood flow around a blocked artery (peripheral bypass surgery).  Surgery to remove dead tissue from an infected wound on the affected limb.  Amputation. This is surgical removal of the affected limb. This may be necessary in cases of acute ischemic limb that are not improved through medical or surgical treatments.  Follow these instructions at home:  Take medicines only as directed by your health care provider.  Do not use any tobacco products, including cigarettes, chewing tobacco, or electronic cigarettes. If you need help quitting, ask your health care provider.  Lose weight if you are overweight, and maintain a healthy weight as directed by your health care provider.  Eat a diet that is low in fat and cholesterol. If you need help, ask your health care provider.  Exercise regularly. Ask your health care provider to suggest some good activities for you.  Use compression stockings or other mechanical devices as directed by your health care provider.  Take good care of your feet. ? Wear comfortable shoes that fit well. ? Check your feet  often for any cuts or sores. Contact a health care provider if:  You have cramps in your legs while walking.  You have leg pain when you are at rest.  You have coldness in a leg or foot.  Your skin changes.  You have erectile dysfunction.  You have cuts or sores on your feet that are not healing. Get help right away if:  Your arm or leg turns cold and blue.  Your arms or legs become red, warm, swollen, painful, or numb.  You have chest pain or trouble breathing.  You suddenly have weakness in your face, arm, or leg.  You become very confused or lose the ability to speak.  You suddenly have a very bad headache or lose your vision. This information is not intended to replace advice given to you by your health care provider. Make sure you discuss any questions you have with your health care provider. Document Released: 11/26/2004 Document Revised: 03/26/2016 Document Reviewed: 03/29/2014 Elsevier Interactive Patient Education  2017 [employer]  

## 2017-03-10 ENCOUNTER — Emergency Department (HOSPITAL_COMMUNITY): Payer: Medicare Other

## 2017-03-10 ENCOUNTER — Inpatient Hospital Stay (HOSPITAL_COMMUNITY)
Admission: EM | Admit: 2017-03-10 | Discharge: 2017-03-15 | DRG: 480 | Disposition: A | Discharge status: Skilled Nursing Facility | Payer: Medicare Other | Attending: Internal Medicine | Admitting: Internal Medicine

## 2017-03-10 ENCOUNTER — Encounter (HOSPITAL_COMMUNITY): Payer: Self-pay

## 2017-03-10 DIAGNOSIS — S728X9A Other fracture of unspecified femur, initial encounter for closed fracture: Secondary | ICD-10-CM | POA: Diagnosis not present

## 2017-03-10 DIAGNOSIS — R2689 Other abnormalities of gait and mobility: Secondary | ICD-10-CM | POA: Diagnosis not present

## 2017-03-10 DIAGNOSIS — Z79899 Other long term (current) drug therapy: Secondary | ICD-10-CM

## 2017-03-10 DIAGNOSIS — E876 Hypokalemia: Secondary | ICD-10-CM | POA: Diagnosis not present

## 2017-03-10 DIAGNOSIS — F0280 Dementia in other diseases classified elsewhere without behavioral disturbance: Secondary | ICD-10-CM | POA: Diagnosis not present

## 2017-03-10 DIAGNOSIS — Z515 Encounter for palliative care: Secondary | ICD-10-CM | POA: Diagnosis present

## 2017-03-10 DIAGNOSIS — S0990XA Unspecified injury of head, initial encounter: Secondary | ICD-10-CM | POA: Diagnosis not present

## 2017-03-10 DIAGNOSIS — Z7189 Other specified counseling: Secondary | ICD-10-CM | POA: Diagnosis not present

## 2017-03-10 DIAGNOSIS — I10 Essential (primary) hypertension: Secondary | ICD-10-CM | POA: Diagnosis not present

## 2017-03-10 DIAGNOSIS — I70262 Atherosclerosis of native arteries of extremities with gangrene, left leg: Secondary | ICD-10-CM | POA: Diagnosis not present

## 2017-03-10 DIAGNOSIS — R9431 Abnormal electrocardiogram [ECG] [EKG]: Secondary | ICD-10-CM | POA: Diagnosis not present

## 2017-03-10 DIAGNOSIS — Z66 Do not resuscitate: Secondary | ICD-10-CM | POA: Diagnosis present

## 2017-03-10 DIAGNOSIS — S72143A Displaced intertrochanteric fracture of unspecified femur, initial encounter for closed fracture: Principal | ICD-10-CM | POA: Diagnosis present

## 2017-03-10 DIAGNOSIS — M25551 Pain in right hip: Secondary | ICD-10-CM | POA: Diagnosis not present

## 2017-03-10 DIAGNOSIS — G301 Alzheimer's disease with late onset: Secondary | ICD-10-CM | POA: Diagnosis not present

## 2017-03-10 DIAGNOSIS — I96 Gangrene, not elsewhere classified: Secondary | ICD-10-CM | POA: Diagnosis present

## 2017-03-10 DIAGNOSIS — Z4789 Encounter for other orthopedic aftercare: Secondary | ICD-10-CM | POA: Diagnosis not present

## 2017-03-10 DIAGNOSIS — F419 Anxiety disorder, unspecified: Secondary | ICD-10-CM | POA: Diagnosis not present

## 2017-03-10 DIAGNOSIS — D62 Acute posthemorrhagic anemia: Secondary | ICD-10-CM | POA: Diagnosis not present

## 2017-03-10 DIAGNOSIS — S79919A Unspecified injury of unspecified hip, initial encounter: Secondary | ICD-10-CM | POA: Diagnosis not present

## 2017-03-10 DIAGNOSIS — S72101A Unspecified trochanteric fracture of right femur, initial encounter for closed fracture: Secondary | ICD-10-CM

## 2017-03-10 DIAGNOSIS — R0902 Hypoxemia: Secondary | ICD-10-CM

## 2017-03-10 DIAGNOSIS — G309 Alzheimer's disease, unspecified: Secondary | ICD-10-CM | POA: Diagnosis not present

## 2017-03-10 DIAGNOSIS — M6281 Muscle weakness (generalized): Secondary | ICD-10-CM | POA: Diagnosis not present

## 2017-03-10 DIAGNOSIS — S72059A Unspecified fracture of head of unspecified femur, initial encounter for closed fracture: Secondary | ICD-10-CM

## 2017-03-10 DIAGNOSIS — S72101D Unspecified trochanteric fracture of right femur, subsequent encounter for closed fracture with routine healing: Secondary | ICD-10-CM

## 2017-03-10 DIAGNOSIS — M069 Rheumatoid arthritis, unspecified: Secondary | ICD-10-CM | POA: Diagnosis present

## 2017-03-10 DIAGNOSIS — T148XXA Other injury of unspecified body region, initial encounter: Secondary | ICD-10-CM | POA: Diagnosis not present

## 2017-03-10 DIAGNOSIS — S72141D Displaced intertrochanteric fracture of right femur, subsequent encounter for closed fracture with routine healing: Secondary | ICD-10-CM | POA: Diagnosis not present

## 2017-03-10 DIAGNOSIS — Z419 Encounter for procedure for purposes other than remedying health state, unspecified: Secondary | ICD-10-CM

## 2017-03-10 DIAGNOSIS — S72141S Displaced intertrochanteric fracture of right femur, sequela: Secondary | ICD-10-CM | POA: Diagnosis not present

## 2017-03-10 DIAGNOSIS — Z7982 Long term (current) use of aspirin: Secondary | ICD-10-CM | POA: Diagnosis not present

## 2017-03-10 DIAGNOSIS — J189 Pneumonia, unspecified organism: Secondary | ICD-10-CM | POA: Diagnosis not present

## 2017-03-10 DIAGNOSIS — S72141A Displaced intertrochanteric fracture of right femur, initial encounter for closed fracture: Secondary | ICD-10-CM | POA: Diagnosis not present

## 2017-03-10 DIAGNOSIS — S79921A Unspecified injury of right thigh, initial encounter: Secondary | ICD-10-CM | POA: Diagnosis not present

## 2017-03-10 DIAGNOSIS — W1830XA Fall on same level, unspecified, initial encounter: Secondary | ICD-10-CM | POA: Diagnosis present

## 2017-03-10 DIAGNOSIS — R569 Unspecified convulsions: Secondary | ICD-10-CM

## 2017-03-10 DIAGNOSIS — M79651 Pain in right thigh: Secondary | ICD-10-CM | POA: Diagnosis not present

## 2017-03-10 DIAGNOSIS — R918 Other nonspecific abnormal finding of lung field: Secondary | ICD-10-CM | POA: Diagnosis not present

## 2017-03-10 HISTORY — DX: Gangrene, not elsewhere classified: I96

## 2017-03-10 HISTORY — DX: Unspecified fracture of head of unspecified femur, initial encounter for closed fracture: S72.059A

## 2017-03-10 LAB — BASIC METABOLIC PANEL
Anion gap: 9 (ref 5–15)
BUN: 20 mg/dL (ref 6–20)
CALCIUM: 9.1 mg/dL (ref 8.9–10.3)
CHLORIDE: 97 mmol/L — AB (ref 101–111)
CO2: 29 mmol/L (ref 22–32)
CREATININE: 0.52 mg/dL (ref 0.44–1.00)
Glucose, Bld: 124 mg/dL — ABNORMAL HIGH (ref 65–99)
Potassium: 4 mmol/L (ref 3.5–5.1)
SODIUM: 135 mmol/L (ref 135–145)

## 2017-03-10 LAB — TYPE AND SCREEN
ABO/RH(D): O POS
ANTIBODY SCREEN: NEGATIVE

## 2017-03-10 LAB — URINALYSIS, ROUTINE W REFLEX MICROSCOPIC
BACTERIA UA: NONE SEEN
Bilirubin Urine: NEGATIVE
GLUCOSE, UA: NEGATIVE mg/dL
KETONES UR: NEGATIVE mg/dL
LEUKOCYTES UA: NEGATIVE
Nitrite: NEGATIVE
PH: 6 (ref 5.0–8.0)
PROTEIN: 100 mg/dL — AB
SQUAMOUS EPITHELIAL / LPF: NONE SEEN
Specific Gravity, Urine: 1.012 (ref 1.005–1.030)

## 2017-03-10 LAB — CBC WITH DIFFERENTIAL/PLATELET
BASOS PCT: 1 %
Basophils Absolute: 0 10*3/uL (ref 0.0–0.1)
EOS ABS: 0.3 10*3/uL (ref 0.0–0.7)
EOS PCT: 3 %
HCT: 28.9 % — ABNORMAL LOW (ref 36.0–46.0)
HEMOGLOBIN: 9.6 g/dL — AB (ref 12.0–15.0)
Lymphocytes Relative: 12 %
Lymphs Abs: 1.1 10*3/uL (ref 0.7–4.0)
MCH: 29.9 pg (ref 26.0–34.0)
MCHC: 33.2 g/dL (ref 30.0–36.0)
MCV: 90 fL (ref 78.0–100.0)
Monocytes Absolute: 0.9 10*3/uL (ref 0.1–1.0)
Monocytes Relative: 11 %
NEUTROS PCT: 73 %
Neutro Abs: 6.6 10*3/uL (ref 1.7–7.7)
PLATELETS: 386 10*3/uL (ref 150–400)
RBC: 3.21 MIL/uL — AB (ref 3.87–5.11)
RDW: 14.3 % (ref 11.5–15.5)
WBC: 8.9 10*3/uL (ref 4.0–10.5)

## 2017-03-10 LAB — PROTIME-INR
INR: 1.1
PROTHROMBIN TIME: 14.3 s (ref 11.4–15.2)

## 2017-03-10 LAB — SURGICAL PCR SCREEN
MRSA, PCR: NEGATIVE
STAPHYLOCOCCUS AUREUS: NEGATIVE

## 2017-03-10 LAB — ABO/RH
ABO/RH(D): O POS
ABO/RH(D): O POS

## 2017-03-10 MED ORDER — MORPHINE SULFATE (PF) 4 MG/ML IV SOLN
0.5000 mg | INTRAVENOUS | Status: DC | PRN
  Administered 2017-03-10: 0.52 mg via INTRAVENOUS
  Administered 2017-03-10: 0.5 mg via INTRAVENOUS
  Administered 2017-03-10 – 2017-03-11 (×3): 0.52 mg via INTRAVENOUS
  Filled 2017-03-10 (×6): qty 1

## 2017-03-10 MED ORDER — ASPIRIN EC 81 MG PO TBEC
81.0000 mg | DELAYED_RELEASE_TABLET | Freq: Every day | ORAL | Status: DC
  Administered 2017-03-10 – 2017-03-15 (×6): 81 mg via ORAL
  Filled 2017-03-10 (×6): qty 1

## 2017-03-10 MED ORDER — PHENYTOIN SODIUM EXTENDED 100 MG PO CAPS
100.0000 mg | ORAL_CAPSULE | Freq: Every day | ORAL | Status: DC
  Administered 2017-03-10 – 2017-03-14 (×5): 100 mg via ORAL
  Filled 2017-03-10 (×5): qty 1

## 2017-03-10 MED ORDER — ATENOLOL 50 MG PO TABS
50.0000 mg | ORAL_TABLET | Freq: Every day | ORAL | Status: DC
Start: 2017-03-10 — End: 2017-03-15
  Administered 2017-03-10 – 2017-03-15 (×6): 50 mg via ORAL
  Filled 2017-03-10 (×6): qty 1

## 2017-03-10 MED ORDER — AMOXICILLIN-POT CLAVULANATE 875-125 MG PO TABS: 1.0000 | ORAL_TABLET | Freq: Two times a day (BID) | ORAL | Status: DC

## 2017-03-10 MED ORDER — SENNA 8.6 MG PO TABS
1.0000 | ORAL_TABLET | Freq: Every day | ORAL | Status: DC
  Administered 2017-03-10 – 2017-03-14 (×5): 8.6 mg via ORAL
  Filled 2017-03-10 (×5): qty 1

## 2017-03-10 MED ORDER — POLYETHYLENE GLYCOL 3350 17 G PO PACK: 17.0000 g | PACK | Freq: Every day | ORAL | Status: DC | PRN

## 2017-03-10 MED ORDER — BISACODYL 10 MG RE SUPP: 10.0000 mg | Freq: Every day | RECTAL | Status: DC | PRN

## 2017-03-10 MED ORDER — POTASSIUM CHLORIDE CRYS ER 10 MEQ PO TBCR
10.0000 meq | EXTENDED_RELEASE_TABLET | Freq: Every day | ORAL | Status: DC
  Administered 2017-03-10 – 2017-03-15 (×5): 10 meq via ORAL
  Filled 2017-03-10 (×5): qty 1

## 2017-03-10 MED ORDER — PIPERACILLIN-TAZOBACTAM 3.375 G IVPB
3.3750 g | Freq: Three times a day (TID) | INTRAVENOUS | Status: DC
  Administered 2017-03-10 – 2017-03-15 (×14): 3.375 g via INTRAVENOUS
  Filled 2017-03-10 (×16): qty 50

## 2017-03-10 MED ORDER — HYDROCODONE-ACETAMINOPHEN 5-325 MG PO TABS
1.0000 | ORAL_TABLET | Freq: Four times a day (QID) | ORAL | Status: DC | PRN
  Administered 2017-03-10: 2 via ORAL
  Administered 2017-03-10 (×4): 1 via ORAL
  Administered 2017-03-11: 2 via ORAL
  Filled 2017-03-10 (×2): qty 2
  Filled 2017-03-10 (×5): qty 1

## 2017-03-10 MED ORDER — GABAPENTIN 100 MG PO CAPS
100.0000 mg | ORAL_CAPSULE | Freq: Three times a day (TID) | ORAL | Status: DC
Start: 2017-03-10 — End: 2017-03-15
  Administered 2017-03-10 – 2017-03-15 (×15): 100 mg via ORAL
  Filled 2017-03-10 (×15): qty 1

## 2017-03-10 MED ORDER — AMLODIPINE BESYLATE 5 MG PO TABS
5.0000 mg | ORAL_TABLET | Freq: Every day | ORAL | Status: DC
  Administered 2017-03-10 – 2017-03-15 (×6): 5 mg via ORAL
  Filled 2017-03-10 (×6): qty 1

## 2017-03-10 MED ORDER — DOCUSATE SODIUM 100 MG PO CAPS
100.0000 mg | ORAL_CAPSULE | Freq: Two times a day (BID) | ORAL | Status: DC
  Administered 2017-03-10 – 2017-03-15 (×10): 100 mg via ORAL
  Filled 2017-03-10 (×10): qty 1

## 2017-03-10 MED ORDER — PHENYTOIN SODIUM EXTENDED 30 MG PO CAPS
30.0000 mg | ORAL_CAPSULE | ORAL | Status: DC
  Filled 2017-03-10: qty 1

## 2017-03-10 MED ORDER — FENTANYL CITRATE (PF) 100 MCG/2ML IJ SOLN
25.0000 ug | INTRAMUSCULAR | Status: AC | PRN
  Administered 2017-03-11 (×2): 25 ug via INTRAVENOUS
  Administered 2017-03-11: 100 ug via INTRAVENOUS
  Administered 2017-03-11: 25 ug via INTRAVENOUS
  Filled 2017-03-10: qty 2

## 2017-03-10 MED ORDER — ENSURE ENLIVE PO LIQD
237.0000 mL | Freq: Two times a day (BID) | ORAL | Status: DC
  Administered 2017-03-10 – 2017-03-14 (×8): 237 mL via ORAL

## 2017-03-10 MED ORDER — FENTANYL CITRATE (PF) 100 MCG/2ML IJ SOLN
25.0000 ug | INTRAMUSCULAR | Status: AC | PRN
  Administered 2017-03-10 (×2): 25 ug via INTRAVENOUS
  Filled 2017-03-10 (×2): qty 2

## 2017-03-10 MED ORDER — SODIUM CHLORIDE 0.9 % IV SOLN
INTRAVENOUS | Status: DC
Start: 2017-03-10 — End: 2017-03-11
  Administered 2017-03-10 (×2): via INTRAVENOUS

## 2017-03-10 NOTE — Progress Notes (Signed)
Pharmacy Antibiotic Note  Kayla Collier is a 83 y.o. female admitted on 03/10/2017 with fall and hip pain.  Patient has gangrene of the left foot and was on Augmentin PTA.  Pharmacy has been consulted for Zosyn dosing.  Awaiting decision on surgery.  Patient's renal function is stable. She is afebrile and her WBC is WNL.   Plan: - Zosyn 3.375gm IV Q8H, 4 hr infusion - Monitor renal fxn, clinical progress - F/U BP control, palliative care consult   Temp (24hrs), Avg:97.7 F (36.5 C), Min:97.7 F (36.5 C), Max:97.7 F (36.5 C)   Recent Labs Lab 03/10/17 0057  WBC 8.9  CREATININE 0.52    Estimated Creatinine Clearance: 29.5 mL/min (by C-G formula based on SCr of 0.52 mg/dL).    Allergies  Allergen Reactions  . Vicodin [Hydrocodone-Acetaminophen] Other (See Comments)    hallucinations     Zosyn 5/9 >>  5/9 BCx -     Vincient Vanaman D. Laney Potash, PharmD, BCPS Pager:  [phone] 03/10/2017, 11:13 AM

## 2017-03-10 NOTE — Progress Notes (Signed)
Patient seen and examined  83 y.o. female with a past medical history significant for seizures and HTN and LEFT toe gangrene who presents with hip pain after a fall.Radiograph of the RIGHT hip showed a comminuted right intertrochanteric fracture  -The case was discussed with Dr. Roda Shutters who recommended transfer to [facility] and evaluation for possible repair on Thursday   Assessment and Plan: 1. Hip fracture: Orthopedics Dr. Roda Shutters  notified by admitting physician, to evaluate for operative fixation of the RIGHT hip.  The patient is high medical risk given her age, uncontrolled BP, frailty, gangrenous foot.  Gupta risk 2.9%. -Admit to med-surg bed, monitor on telemetry -Hydrocodone-acetaminophen or morphine as tolerated for pain -Bed rest, apply ice, document sedation and vitals per Hip fracture protocol -Nutrition consulted -Consult to Palliative Care-discussed poor prognosis with daughter   2. Gangrene of the LEFT foot: According to Dr. Barbara Cower dew, patient has  critical limb threatening situation.  Continue local wound care. Angiogram to be scheduled as soon as possible   Evaluated by [facility] and planned for angiogram in 1 week. Patient was on Augmentin, off for 5 days. Will resume antibiotics, start patient on Zosyn pending decision about angiogram/surgery Vascular surgery consulted for further recommendations, Dr. Liam Rogers WOC  3. Hypertension Hypertensive at admission. -Continue atenolol, amlodipine -Continue aspirin  4. Seizures: -Continue Dilantin and gabapentin  5. Dementia: Delirium precautions:              -Lights and TV off, minimize interruptions at night             -Blinds open and lights on during day             -Glasses/hearing aid with patient             -Frequent reorientation             -PT/OT when able             -Avoid sedation medications/Beers list medications Palliative care consultation-doubt patient would be a candidate for  intervention/surgery

## 2017-03-10 NOTE — ED Notes (Signed)
Pt needed pain medicine so we can lay her on her back in order to get her EKG and put in a foley

## 2017-03-10 NOTE — Evaluation (Signed)
Clinical/Bedside Swallow Evaluation Patient Details  Name: Kayla Collier MRN: [id number] Date of Birth: 1918-09-20  Today's Date: 03/10/2017 Time: SLP Start Time (ACUTE ONLY): 1417 SLP Stop Time (ACUTE ONLY): 1435 SLP Time Calculation (min) (ACUTE ONLY): 18 min  Past Medical History:  Past Medical History:  Diagnosis Date  . Anxiety   . Arthritis    RA  . Closed fracture head of femur (HCC) 03/10/2017  . Gangrene (HCC)   . Hypertension   . Seizures (HCC)    Past Surgical History:  Past Surgical History:  Procedure Laterality Date  . CATARACT EXTRACTION    . TOTAL HIP ARTHROPLASTY     HPI:  Ptis a 83 y.o. female admitted s/p fall with R hip fracture. PMH includes RA, HTN, dementia, left tow gangrene, seizures   Assessment / Plan / Recommendation Clinical Impression  Evaluation was limited as pt declined all boluses offered except for water. She consumed sips of water in a mostly upright position due to increased pain in R hip with attempts to elevate the [facility] further. Audible swallows and intermittent eructation is observed with straw sips of water, but there are no overt signs of aspiration. She appears to swallow swiftly. Recommend to continue current diet for now, although with SLP f/u while to assess for tolerance given limited assessment and risk factors for aspiration. SLP Visit Diagnosis: Dysphagia, unspecified (R13.10)    Aspiration Risk  Mild aspiration risk    Diet Recommendation Regular;Thin liquid   Liquid Administration via: Cup;Straw Medication Administration: Whole meds with liquid Supervision: Patient able to self feed;Full supervision/cueing for compensatory strategies Compensations: Slow rate;Small sips/bites Postural Changes: Seated upright at 90 degrees;Remain upright for at least 30 minutes after po intake    Other  Recommendations Oral Care Recommendations: Oral care BID   Follow up Recommendations Skilled Nursing facility      Frequency and  Duration min 2x/week  2 weeks       Prognosis Prognosis for Safe Diet Advancement: Good      Swallow Study   General HPI: Ptis a 83 y.o. female admitted s/p fall with R hip fracture. PMH includes RA, HTN, dementia, left tow gangrene, seizures Type of Study: Bedside Swallow Evaluation Previous Swallow Assessment: none in chart Diet Prior to this Study: Regular;Thin liquids Temperature Spikes Noted: No Respiratory Status: Room air History of Recent Intubation: No Behavior/Cognition: Alert;Cooperative;Requires cueing Oral Care Completed by SLP: No Oral Cavity - Dentition: Dentures, not available;Other (Comment) (daughter at SNF trying to get them) Patient Positioning: Partially reclined (limtied by pain in R hip) Baseline Vocal Quality: Normal    Oral/Motor/Sensory Function     Ice Chips Ice chips: Not tested   Thin Liquid Thin Liquid: Impaired Presentation: Straw Pharyngeal  Phase Impairments: Other (comments) (audible swallow)    Nectar Thick Nectar Thick Liquid: Not tested   Honey Thick Honey Thick Liquid: Not tested   Puree Puree: Not tested   Solid   GO   Solid: Not tested        Maxcine Ham 03/10/2017,2:52 PM  Maxcine Ham, M.A. CCC-SLP [phone]

## 2017-03-10 NOTE — ED Provider Notes (Signed)
[facility]-EMERGENCY DEPT Provider Note    By signing my name below, I, Earmon Phoenix, attest that this documentation has been prepared under the direction and in the presence of Pricilla Loveless, MD. Electronically Signed: Earmon Phoenix, ED Scribe. 03/10/17. 3:12 AM.    History   Chief Complaint Chief Complaint  Patient presents with  . Fall  . Hip Injury    Right   The history is provided by the patient and medical records. No language interpreter was used.    Kayla Collier is a 83 y.o. female with PMHx of rheumatoid arthritis, HTN, brought in by EMS from [facility], who presents to the Emergency Department complaining of a fall that occurred PTA. She reports associated right hip pain. She is unsure of how the fall occurred. She has not taken anything for pain. Touching the right hip increases the pain. She denies alleviating factors. She is unsure of head trauma. She denies any other injuries or complaints.   Past Medical History:  Diagnosis Date  . Anxiety   . Arthritis    RA  . Hypertension   . Seizures [facility])     Patient Active Problem List   Diagnosis Date Noted  . 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 Gangrene of foot (HCC) 02/19/2017  . HTN (hypertension) 02/19/2017  . Anxiety 02/19/2017  . Seizures (HCC) 02/19/2017    Past Surgical History:  Procedure Laterality Date  . CATARACT EXTRACTION    . TOTAL HIP ARTHROPLASTY      OB History    No data available       Home Medications    Prior to Admission medications   Medication Sig Start Date End Date Taking? Authorizing Provider  acetaminophen (TYLENOL) 500 MG tablet Take 1,000 mg by mouth 2 (two) times daily.   Yes [provider]  amLODipine (NORVASC) 5 MG tablet Take 5 mg by mouth daily.   Yes [provider]  aspirin EC 81 MG tablet Take 81 mg by mouth daily.   Yes [provider]  atenolol (TENORMIN) 50 MG tablet Take 50 mg by mouth  daily.   Yes [provider]  cholecalciferol (VITAMIN D) 1000 units tablet Take 1,000 Units by mouth daily.   Yes [provider]  gabapentin (NEURONTIN) 100 MG capsule Take 100 mg by mouth 3 (three) times daily.   Yes [provider]  Misc Natural Products (TART CHERRY ADVANCED) CAPS Take 1 capsule by mouth daily.   Yes [provider]  oxyCODONE (OXY IR/ROXICODONE) 5 MG immediate release tablet Take 1 tablet (5 mg total) by mouth every 4 (four) hours as needed for severe pain. 02/23/17  Yes Sudini, Wardell Heath, MD  phenytoin (DILANTIN) 100 MG ER capsule Take 100 mg by mouth at bedtime.    Yes [provider]  phenytoin (DILANTIN) 30 MG ER capsule Take 30 mg by mouth 3 (three) times a week. Take at bedtime with 100mg  on Monday, Wednesday, and Friday   Yes [provider]  potassium chloride (MICRO-K) 10 MEQ CR capsule Take 10 mEq by mouth daily.    Yes [provider]  vitamin B-12 (CYANOCOBALAMIN) 1000 MCG tablet Take 1,000 mcg by mouth daily.   Yes [provider]  vitamin E (VITAMIN E) 400 UNIT capsule Take 400 Units by mouth daily.   Yes [provider]  amoxicillin-clavulanate (AUGMENTIN) 500-125 MG tablet Take 1 tablet (500 mg total) by mouth 2 (two) times daily. Patient not taking: Reported  on 03/09/2017 02/23/17   Milagros Loll, MD  hydrALAZINE (APRESOLINE) 25 MG tablet Take 1 tablet (25 mg total) by mouth 3 (three) times daily. Patient not taking: Reported on 03/10/2017 02/23/17   Milagros Loll, MD    Family History Family History  Problem Relation Age of Onset  . Family history unknown: Yes    Social History Social History  Substance Use Topics  . Smoking status: Never Smoker  . Smokeless tobacco: Never Used  . Alcohol use No     Allergies   Vicodin [hydrocodone-acetaminophen]   Review of Systems Review of Systems  Musculoskeletal: Positive for arthralgias.  Skin: Negative for color change and  wound.  All other systems reviewed and are negative.    Physical Exam Updated Vital Signs BP (!) 181/65 (BP Location: Right Arm)   Pulse 75   Temp 97.7 F (36.5 C) (Oral)   Resp 20   SpO2 100%   Physical Exam  Constitutional: She is oriented to person, place, and time. She appears well-developed and well-nourished.  HENT:  Head: Normocephalic and atraumatic.  Right Ear: External ear normal.  Left Ear: External ear normal.  Nose: Nose normal.  Eyes: Right eye exhibits no discharge. Left eye exhibits no discharge.  Cardiovascular: Normal rate, regular rhythm and normal heart sounds.   Pulmonary/Chest: Effort normal and breath sounds normal.  Abdominal: Soft. There is no tenderness.  Musculoskeletal: She exhibits tenderness.  Right hip and proximal thigh tenderness. No left hip tenderness of decreased ROM.  Neurological: She is alert and oriented to person, place, and time.  Skin: Skin is warm and dry.  Nursing note and vitals reviewed.    ED Treatments / Results  DIAGNOSTIC STUDIES: Oxygen Saturation is 100% on RA, normal by my interpretation.   COORDINATION OF CARE: 12:52 AM- Will order pain medication and wait for imaging to result. Pt verbalizes understanding and agrees to plan.  Medications  fentaNYL (SUBLIMAZE) injection 25 mcg (25 mcg Intravenous Given 03/10/17 0311)    Labs (all labs ordered are listed, but only abnormal results are displayed) Labs Reviewed  BASIC METABOLIC PANEL - Abnormal; Notable for the following:       Result Value   Chloride 97 (*)    Glucose, Bld 124 (*)    All other components within normal limits  CBC WITH DIFFERENTIAL/PLATELET - Abnormal; Notable for the following:    RBC 3.21 (*)    Hemoglobin 9.6 (*)    HCT 28.9 (*)    All other components within normal limits  PROTIME-INR  URINALYSIS, ROUTINE W REFLEX MICROSCOPIC  TYPE AND SCREEN  ABO/RH    EKG  EKG Interpretation None       Radiology Ct Head Wo Contrast  Result  Date: 03/10/2017 CLINICAL DATA:  Fall. EXAM: CT HEAD WITHOUT CONTRAST TECHNIQUE: Contiguous axial images were obtained from the base of the skull through the vertex without intravenous contrast. COMPARISON:  05/10/2007 FINDINGS: Brain: Diffuse cerebral atrophy. Ventricular dilatation consistent with central atrophy. Low-attenuation changes in the deep white matter consistent with small vessel ischemia. No mass effect or midline shift. No abnormal extra-axial fluid collections. Gray-white matter junctions are distinct. Basal cisterns are not effaced. No acute intracranial hemorrhage. Vascular: Vascular calcifications are present. Skull: Calvarium appears intact. Sinuses/Orbits: Old deformities of the medial orbital walls bilaterally, likely representing old fracture deformities. Paranasal sinuses and mastoid air cells are clear. Postoperative changes in the globes. Other: No significant change since prior study. IMPRESSION: No acute intracranial abnormalities. Chronic atrophy  and small vessel ischemic changes. Electronically Signed   By: Burman Nieves M.D.   On: 03/10/2017 02:46   Dg Hip Unilat  With Pelvis 2-3 Views Right  Result Date: 03/10/2017 CLINICAL DATA:  Status post fall, with right hip deformity. Initial encounter. EXAM: DG HIP (WITH OR WITHOUT PELVIS) 2-3V RIGHT COMPARISON:  None. FINDINGS: There is a mildly comminuted right femoral intertrochanteric fracture, with overlying soft tissue swelling and medial angulation of the distal femur. No definite additional fractures are seen. Left femoral hardware is grossly unremarkable in appearance, without evidence of loosening. There is mild chronic deformity of the left inferior pubic ramus. The sacroiliac joints are grossly unremarkable in appearance. The visualized bowel gas pattern is unremarkable. Scattered vascular calcifications are seen. IMPRESSION: 1. Mildly comminuted right femoral intertrochanteric fracture, with overlying soft tissue swelling and  medial angulation of the distal femur. 2. Scattered vascular calcifications seen. Electronically Signed   By: Roanna Raider M.D.   On: 03/10/2017 02:43   Dg Femur Min 2 Views Right  Result Date: 03/10/2017 CLINICAL DATA:  Status post fall, with right hip deformity. Initial encounter. EXAM: RIGHT FEMUR 2 VIEWS COMPARISON:  None. FINDINGS: The comminuted right femoral intertrochanteric fracture is characterized on right hip radiographs. The distal right femur appears intact. No knee joint effusion is seen. Diffuse vascular calcifications are noted. No significant degenerative change is noted at the right knee. IMPRESSION: 1. Comminuted right femoral intertrochanteric fracture. 2. Diffuse vascular calcifications seen. Electronically Signed   By: Roanna Raider M.D.   On: 03/10/2017 02:44    Procedures Procedures (including critical care time)  Medications Ordered in ED Medications  fentaNYL (SUBLIMAZE) injection 25 mcg (25 mcg Intravenous Given 03/10/17 0311)     Initial Impression / Assessment and Plan / ED Course  I have reviewed the triage vital signs and the nursing notes.  Pertinent labs & imaging results that were available during my care of the patient were reviewed by me and considered in my medical decision making (see chart for details).     Patient has a right intertrochanteric hip fracture. Appears NV intact. Pain is controlled. Prior notes from Dec indicate she was in hospice/palliative care. However she also recently had a left foot surgery for atherosclerosis with gangrene. Tried to call daughter multiple times with no answer. Dr. Roda Shutters consulted and will see, [facility] Dr. Maryfrances Bunnell to admit  Final Clinical Impressions(s) / ED Diagnoses   Final diagnoses:  Closed fracture of trochanter of right femur, initial encounter [facility])    New Prescriptions New Prescriptions   No medications on file    I personally performed the services described in this  documentation, which was scribed in my presence. The recorded information has been reviewed and is accurate.     Pricilla Loveless, MD 03/10/17 [phone]

## 2017-03-10 NOTE — ED Notes (Signed)
Bed: WA21 Expected date:  Expected time:  Means of arrival:  Comments: 83 yr old fall, right hip pain

## 2017-03-10 NOTE — Consult Note (Signed)
Referring Physician: Dr Roda Shutters, Orthopedics  Patient name: Kayla Collier MRN: [id number] DOB: 1917-11-09 Sex: female  REASON FOR CONSULT: gangrene left first toe  HPI: Kayla Collier is a 83 y.o. female with dry gangrene of left first toe.  She was scheduled for agram by Dr Wyn Quaker at [facility].  Pt lives in SNF and had fall now with right hip fracture scheduled for repair tomorrow in OR.  Per Dr Driscilla Grammes note pt was ambulatory prior to hip fracture.  She has history of dementia. Other medical problems include hypertension and seizures per chart.  Pt is poor historian.   Past Medical History:  Diagnosis Date  . Anxiety   . Arthritis    RA  . Closed fracture head of femur (HCC) 03/10/2017  . Gangrene (HCC)   . Hypertension   . Seizures (HCC)    Past Surgical History:  Procedure Laterality Date  . CATARACT EXTRACTION    . TOTAL HIP ARTHROPLASTY      Family History  Problem Relation Age of Onset  . Family history unknown: Yes    SOCIAL HISTORY: Social History   Social History  . Marital status: Widowed    Spouse name: N/A  . Number of children: N/A  . Years of education: N/A   Occupational History  . Not on file.   Social History Main Topics  . Smoking status: Never Smoker  . Smokeless tobacco: Never Used  . Alcohol use No  . Drug use: No  . Sexual activity: Not on file   Other Topics Concern  . Not on file   Social History Narrative  . No narrative on file    Allergies  Allergen Reactions  . Vicodin [Hydrocodone-Acetaminophen] Other (See Comments)    hallucinations    Current Facility-Administered Medications  Medication Dose Route Frequency Provider Last Rate Last Dose  . 0.9 %  sodium chloride infusion   Intravenous Continuous Richarda Overlie, MD 75 mL/hr at 03/10/17 1218    . amLODipine (NORVASC) tablet 5 mg  5 mg Oral Daily Danford, Earl Lites, MD   5 mg at 03/10/17 0923  . aspirin EC tablet 81 mg  81 mg Oral Daily Alberteen Sam, MD   81  mg at 03/10/17 5825  . atenolol (TENORMIN) tablet 50 mg  50 mg Oral Daily Alberteen Sam, MD   50 mg at 03/10/17 0924  . bisacodyl (DULCOLAX) suppository 10 mg  10 mg Rectal Daily PRN Danford, Earl Lites, MD      . docusate sodium (COLACE) capsule 100 mg  100 mg Oral BID Alberteen Sam, MD   100 mg at 03/10/17 0924  . feeding supplement (ENSURE ENLIVE) (ENSURE ENLIVE) liquid 237 mL  237 mL Oral BID BM Abrol, Nayana, MD   237 mL at 03/10/17 1710  . fentaNYL (SUBLIMAZE) injection 25 mcg  25 mcg Intravenous Q2H PRN Richarda Overlie, MD      . gabapentin (NEURONTIN) capsule 100 mg  100 mg Oral TID Alberteen Sam, MD   100 mg at 03/10/17 1710  . HYDROcodone-acetaminophen (NORCO/VICODIN) 5-325 MG per tablet 1-2 tablet  1-2 tablet Oral Q6H PRN Alberteen Sam, MD   1 tablet at 03/10/17 1922  . morphine 4 MG/ML injection 0.52 mg  0.52 mg Intravenous Q2H PRN Alberteen Sam, MD   0.5 mg at 03/10/17 1811  . phenytoin (DILANTIN) ER capsule 100 mg  100 mg Oral QHS Danford, Earl Lites, MD      .  phenytoin (DILANTIN) ER capsule 30 mg  30 mg Oral Once per day on Mon Wed Fri Danford, Earl Lites, MD      . piperacillin-tazobactam (ZOSYN) IVPB 3.375 g  3.375 g Intravenous Q8H Gerilyn Nestle, [place]   Stopped at 03/10/17 1710  . polyethylene glycol (MIRALAX / GLYCOLAX) packet 17 g  17 g Oral Daily PRN Danford, Earl Lites, MD      . potassium chloride (K-DUR,KLOR-CON) CR tablet 10 mEq  10 mEq Oral Daily Alberteen Sam, MD   10 mEq at 03/10/17 0924  . senna (SENOKOT) tablet 8.6 mg  1 tablet Oral QHS Ulice Bold, NP        ROS:   Unable to obtain secondary to pt severe hearing loss and dementia  Physical Examination  Vitals:   03/10/17 0400 03/10/17 0446 03/10/17 0650 03/10/17 1735  BP: (!) 179/55 (!) 146/92 (!) 195/61   Pulse: 77 81 84   Resp: 19 15 17    Temp:      TempSrc:      SpO2: 94% 99% 100%   Weight:    100 lb (45.4 kg)  Height:    5' (1.524 m)     Body mass index is 19.53 kg/m.  General:  Alert and oriented x 1 person does not know date or type of building, thinks she lives in [place], no acute distress HEENT: Normal Neck: No JVD Pulmonary: Clear to auscultation bilaterally Cardiac: Regular Rate and Rhythm  Abdomen: Soft, non-tender, non-distended Skin: No rash, dry gangrene left first toe Extremity Pulses:  2+ radial, brachial, femoral,absent popliteal dorsalis pedis, posterior tibial pulses bilaterally Musculoskeletal: No deformity or edema but lying in bed with both hips flexed  Neurologic: Upper and lower extremity motor 5/5 and symmetric  DATA:  CBC    Component Value Date/Time   WBC 8.9 03/10/2017 0057   RBC 3.21 (L) 03/10/2017 0057   HGB 9.6 (L) 03/10/2017 0057   HCT 28.9 (L) 03/10/2017 0057   PLT 386 03/10/2017 0057   MCV 90.0 03/10/2017 0057   MCH 29.9 03/10/2017 0057   MCHC 33.2 03/10/2017 0057   RDW 14.3 03/10/2017 0057   LYMPHSABS 1.1 03/10/2017 0057   MONOABS 0.9 03/10/2017 0057   EOSABS 0.3 03/10/2017 0057   BASOSABS 0.0 03/10/2017 0057    BMET    Component Value Date/Time   NA 135 03/10/2017 0057   K 4.0 03/10/2017 0057   CL 97 (L) 03/10/2017 0057   CO2 29 03/10/2017 0057   GLUCOSE 124 (H) 03/10/2017 0057   BUN 20 03/10/2017 0057   CREATININE 0.52 03/10/2017 0057   CALCIUM 9.1 03/10/2017 0057   GFRNONAA >60 03/10/2017 0057   GFRAA >60 03/10/2017 0057     ASSESSMENT:  Dry gangrene of first toe.  May need arteriogram at some point but will need hip fixed first and see how she does with this to determine timing of agram   PLAN:  Will follow as consult. Will see again Friday after hip fracture repaired   Tuesday, MD [facility]: [phone] Pager: [phone]

## 2017-03-10 NOTE — H&P (Signed)
History and Physical  Patient Name: Kayla Collier     GMW:[id number]    DOB: 18-Feb-1918    DOA: 03/10/2017 Referring provider: Pricilla Loveless, MD PCP: Ileana Ladd, MD    Patient coming from: [facility]     Chief Complaint: Hip pain and fall  HPI: Kayla Collier is a 83 y.o. female with a past medical history significant for seizures and HTN and LEFT toe gangrene who presents with hip pain after a fall.  Caveat that patient is unable to provide any of her own history, which is collected from [facility] staff via EDP.  The patient was in her normal state of health (lives in [place], ambulates with assistance, advanced dementia) until this evening when she had a fall and had immediate LEFT hip pain and could not walk.    ED course: -Afebrile, heart rate 75, respirations 20, blood pressure 181/65, pulse oximetry normal  -Sodium 135, potassium 4, creatinine 0.45, WBC normal, hemoglobin 9.6 -Radiograph of the RIGHT hip showed a comminuted right intertrochanteric fracture  -The case was discussed with Dr. Roda Shutters who recommended transfer to [facility] and evaluation for possible repair on Thursday     Review of Systems:  Review of Systems  Unable to perform ROS: Dementia          Past Medical History:  Diagnosis Date  . Anxiety   . Arthritis    RA  . Gangrene (HCC)   . Hypertension   . Seizures (HCC)     Past Surgical History:  Procedure Laterality Date  . CATARACT EXTRACTION    . TOTAL HIP ARTHROPLASTY      Social History: Patient lives at [facility].  Patient walks with assistance.  She is not a smoker.  She is from [place].    Allergies  Allergen Reactions  . Vicodin [Hydrocodone-Acetaminophen] Other (See Comments)    hallucinations    Family history: Family history is unknown by patient.  Unable to obtain due to dementia.  Prior to Admission medications   Medication Sig Start Date End Date Taking? Authorizing Provider  acetaminophen (TYLENOL) 500 MG tablet Take 1,000  mg by mouth 2 (two) times daily.   Yes [provider]  amLODipine (NORVASC) 5 MG tablet Take 5 mg by mouth daily.   Yes [provider]  aspirin EC 81 MG tablet Take 81 mg by mouth daily.   Yes [provider]  atenolol (TENORMIN) 50 MG tablet Take 50 mg by mouth daily.   Yes [provider]  cholecalciferol (VITAMIN D) 1000 units tablet Take 1,000 Units by mouth daily.   Yes [provider]  gabapentin (NEURONTIN) 100 MG capsule Take 100 mg by mouth 3 (three) times daily.   Yes [provider]  Misc Natural Products (TART CHERRY ADVANCED) CAPS Take 1 capsule by mouth daily.   Yes [provider]  oxyCODONE (OXY IR/ROXICODONE) 5 MG immediate release tablet Take 1 tablet (5 mg total) by mouth every 4 (four) hours as needed for severe pain. 02/23/17  Yes Sudini, Wardell Heath, MD  phenytoin (DILANTIN) 100 MG ER capsule Take 100 mg by mouth at bedtime.    Yes [provider]  phenytoin (DILANTIN) 30 MG ER capsule Take 30 mg by mouth 3 (three) times a week. Take at bedtime with 100mg  on Monday, Wednesday, and Friday   Yes [provider]  potassium chloride (MICRO-K) 10 MEQ CR capsule Take 10 mEq by mouth daily.    Yes [provider]  vitamin  B-12 (CYANOCOBALAMIN) 1000 MCG tablet Take 1,000 mcg by mouth daily.   Yes [provider]  vitamin E (VITAMIN E) 400 UNIT capsule Take 400 Units by mouth daily.   Yes [provider]       Physical Exam: BP (!) 146/92 (BP Location: Right Arm)   Pulse 81   Temp 97.7 F (36.5 C) (Oral)   Resp 15   SpO2 99%  General appearance: Frail eldery adult female, awake and alert, lying on her left side.    Eyes: Anicteric, conjunctiva watery, lids and lashes normal. Pupils equal. ENT: No nasal deformity, discharge, epistaxis.  Hearing poor. OP moist without lesions.    Skin: Warm and dry.  No suspicious rashes or lesions. Cardiac: RRR, nl S1-S2, no murmurs  appreciated.  Capillary refill is brisk.  JVP normal.  No LE edema.  Radial pulses 2+ and symmetric.   Respiratory: Normal respiratory rate and rhythm.  CTAB without rales or wheezes. GI: Abdomen soft without rigidity.  No TTP. No ascites, distension, no hepatosplenomegaly.  MSK: Right leg painful to palpation.  Guards.  No effusions.  No clubbing or cyanosis. Neuro: Not oriented to self, situation, or time.  Asks why the bus left her here.  Responding to questions, seems to be aware of me, attentive, just disoriented.  Speech is fluent.  Moves upper extremities equally.  Cranial nerves grossly normal.  Psych:  Affect normal.  Recall, recent and remote, as well as general fund of knowledge seem very impaired. No evidence of aural or visual hallucinations or delusions.     Labs on Admission:  I have personally reviewed following labs and imaging studies: CBC:  Recent Labs Lab 03/10/17 0057  WBC 8.9  NEUTROABS 6.6  HGB 9.6*  HCT 28.9*  MCV 90.0  PLT 386   Basic Metabolic Panel:  Recent Labs Lab 03/10/17 0057  NA 135  K 4.0  CL 97*  CO2 29  GLUCOSE 124*  BUN 20  CREATININE 0.52  CALCIUM 9.1   GFR: Estimated Creatinine Clearance: 29.5 mL/min (by C-G formula based on SCr of 0.52 mg/dL).  Liver Function Tests: No results for input(s): AST, ALT, ALKPHOS, BILITOT, PROT, ALBUMIN in the last 168 hours. No results for input(s): LIPASE, AMYLASE in the last 168 hours. No results for input(s): AMMONIA in the last 168 hours. Coagulation Profile:  Recent Labs Lab 03/10/17 0057  INR 1.10   Cardiac Enzymes: No results for input(s): CKTOTAL, CKMB, CKMBINDEX, TROPONINI in the last 168 hours. BNP (last 3 results) No results for input(s): PROBNP in the last 8760 hours. HbA1C: No results for input(s): HGBA1C in the last 72 hours. CBG: No results for input(s): GLUCAP in the last 168 hours. Lipid Profile: No results for input(s): CHOL, HDL, LDLCALC, TRIG, CHOLHDL, LDLDIRECT in the  last 72 hours. Thyroid Function Tests: No results for input(s): TSH, T4TOTAL, FREET4, T3FREE, THYROIDAB in the last 72 hours. Anemia Panel: No results for input(s): VITAMINB12, FOLATE, FERRITIN, TIBC, IRON, RETICCTPCT in the last 72 hours.    Radiological Exams on Admission: Personally reviewed CT head report, hip radiograph reports: Ct Head Wo Contrast  Result Date: 03/10/2017 CLINICAL DATA:  Fall. EXAM: CT HEAD WITHOUT CONTRAST TECHNIQUE: Contiguous axial images were obtained from the base of the skull through the vertex without intravenous contrast. COMPARISON:  05/10/2007 FINDINGS: Brain: Diffuse cerebral atrophy. Ventricular dilatation consistent with central atrophy. Low-attenuation changes in the deep white matter consistent with small vessel ischemia. No mass effect or midline shift.  No abnormal extra-axial fluid collections. Gray-white matter junctions are distinct. Basal cisterns are not effaced. No acute intracranial hemorrhage. Vascular: Vascular calcifications are present. Skull: Calvarium appears intact. Sinuses/Orbits: Old deformities of the medial orbital walls bilaterally, likely representing old fracture deformities. Paranasal sinuses and mastoid air cells are clear. Postoperative changes in the globes. Other: No significant change since prior study. IMPRESSION: No acute intracranial abnormalities. Chronic atrophy and small vessel ischemic changes. Electronically Signed   By: Burman Nieves M.D.   On: 03/10/2017 02:46   Dg Hip Unilat  With Pelvis 2-3 Views Right  Result Date: 03/10/2017 CLINICAL DATA:  Status post fall, with right hip deformity. Initial encounter. EXAM: DG HIP (WITH OR WITHOUT PELVIS) 2-3V RIGHT COMPARISON:  None. FINDINGS: There is a mildly comminuted right femoral intertrochanteric fracture, with overlying soft tissue swelling and medial angulation of the distal femur. No definite additional fractures are seen. Left femoral hardware is grossly unremarkable in  appearance, without evidence of loosening. There is mild chronic deformity of the left inferior pubic ramus. The sacroiliac joints are grossly unremarkable in appearance. The visualized bowel gas pattern is unremarkable. Scattered vascular calcifications are seen. IMPRESSION: 1. Mildly comminuted right femoral intertrochanteric fracture, with overlying soft tissue swelling and medial angulation of the distal femur. 2. Scattered vascular calcifications seen. Electronically Signed   By: Roanna Raider M.D.   On: 03/10/2017 02:43   Dg Femur Min 2 Views Right  Result Date: 03/10/2017 CLINICAL DATA:  Status post fall, with right hip deformity. Initial encounter. EXAM: RIGHT FEMUR 2 VIEWS COMPARISON:  None. FINDINGS: The comminuted right femoral intertrochanteric fracture is characterized on right hip radiographs. The distal right femur appears intact. No knee joint effusion is seen. Diffuse vascular calcifications are noted. No significant degenerative change is noted at the right knee. IMPRESSION: 1. Comminuted right femoral intertrochanteric fracture. 2. Diffuse vascular calcifications seen. Electronically Signed   By: Roanna Raider M.D.   On: 03/10/2017 02:44    EKG: Independently reviewed. Rate 76, QTc 460, normal sinus rhythm.    Assessment and Plan: 1. Hip fracture: The patient will be seen by Dr. Roda Shutters in the morning at [facility], to evaluate for operative fixation of the RIGHT hip.  The patient is high medical risk given her age, uncontrolled BP, frailty, gangrenous foot.  Gupta risk 2.9%.  Attempts to reach family overnight were not successful. -Admit to med-surg bed -Hydrocodone-acetaminophen or morphine as tolerated for pain -Bed rest, apply ice, document sedation and vitals per Hip fracture protocol -Nutrition consulted -Consult to Palliative Care     2. Gangrene of the LEFT foot: Evaluated by [facility] and planned for angiogram in 1 week.  Recently on Augmentin.  Currently  bandaged. -Consult WOC  3. Hypertension Hypertensive at admission. -Continue atenolol, amlodipine -Continue aspirin  4. Seizures: -Continue Dilantin and gabapentin  5. Dementia: Delirium precautions:   -Lights and TV off, minimize interruptions at night  -Blinds open and lights on during day  -Glasses/hearing aid with patient  -Frequent reorientation  -PT/OT when able  -Avoid sedation medications/Beers list medications           DVT prophylaxis: SCDs  Diet: Heart healthy for now Code Status: DO NOT RESUSCITATE per form at bedside  Family Communication: None were able to reach  Disposition Plan: Anticipate evaluation by Orthopedics and possible surgical fixation tomorrow. Admission status: INPATIENT for hip fracture, medical surgical bed     Medical decision making and consults: Patient seen at 5:10 AM on  03/10/2017.  The patient was discussed with Dr. Criss Alvine. What exists of the patient's chart was reviewed in depth and summarized above.  Clinical condition: stable at present, overall prognosis poor.      Alberteen Sam [facility] Pager [phone]

## 2017-03-10 NOTE — Consult Note (Signed)
Consultation Note Date: 03/10/2017   Patient Name: Kayla Collier  DOB: 1918-08-30  MRN: [id number]  Age / Sex: 83 y.o., female  PCP: Vernie Shanks, MD Referring Physician: Reyne Dumas, MD  Reason for Consultation: Establishing goals of care and Psychosocial/spiritual support r/t decline with dementia, hip fracture, and gangrenous foot.   HPI/Patient Profile: 83 y.o. female  with past medical history of seizures, HTN, left great toe gangrene, dementia admitted on 03/10/2017 with fall with right hip pain and fracture.   Clinical Assessment and Goals of Care: I met at [name]'s bedside with her caregiver, Blanch Media. Blanch Media tells me about [name]'s physical decline and recent admission when she went to [facility] but was previously living at her daughter's home. Blanch Media says that [name] has previously been alert and interactive. She shares that [name] has one child and that is daughter, [name], and [name] has been struggling with her mother's decline.   Blanch Media shares that [name] has gone to collect her mother's belongings from [employer]. Blanch Media also shares that [name] has always had faith for "God to let me live a long life but if he calls me home that's okay too." Blanch Media does not believe that [name] or [name] would want aggressive measures or surgery. She also says that [name] is waiting to her recommendations from ortho and vascular.   Palliative to follow up after input from ortho and vascular. Prognosis poor with multiple severe comorbitities and unlikely to benefit from any surgical interventions. Would not recommend from palliative perspective.   Primary Decision Maker NEXT OF KIN daughter [name]    SUMMARY OF RECOMMENDATIONS   - DNR - Follow up with [name] after input from vascular and ortho  Code Status/Advance Care Planning:  DNR   Symptom Management:   Pain: Continue current regimen  with Vicodin and morphine prn. She is resting comfortably.   Bowel Regimen: Added senokot 1 tablet qhs with increased opioid use.   Palliative Prophylaxis:   Aspiration, Bowel Regimen, Delirium Protocol, Frequent Pain Assessment, Oral Care and Turn Reposition   Psycho-social/Spiritual:   Desire for further Chaplaincy support:yes  Additional Recommendations: Caregiving  Support/Resources and Education on Hospice  Prognosis:   Very poor. Depending on aggressiveness of care. Even with aggressive care would still be eligible for hospice. Likely eligible for hospice facility especially if comfort care desired.   Discharge Planning: To Be Determined      Primary Diagnoses: Present on Admission: . Closed comminuted intertrochanteric fracture of proximal end of right femur ([place]) . Gangrene of foot ([place]) . Essential hypertension . Alzheimer's type dementia with late onset without behavioral disturbance   I have reviewed the medical record, interviewed the patient and family, and examined the patient. The following aspects are pertinent.  Past Medical History:  Diagnosis Date  . Anxiety   . Arthritis    RA  . Closed fracture head of femur ([place]) 03/10/2017  . Gangrene ([place])   . Hypertension   . Seizures [facility])    Social History  Social History  . Marital status: Widowed    Spouse name: N/A  . Number of children: N/A  . Years of education: N/A   Social History Main Topics  . Smoking status: Never Smoker  . Smokeless tobacco: Never Used  . Alcohol use No  . Drug use: No  . Sexual activity: Not Asked   Other Topics Concern  . None   Social History Narrative  . None   Family History  Problem Relation Age of Onset  . Family history unknown: Yes   Scheduled Meds: . amLODipine  5 mg Oral Daily  . aspirin EC  81 mg Oral Daily  . atenolol  50 mg Oral Daily  . docusate sodium  100 mg Oral BID  . feeding supplement (ENSURE ENLIVE)  237 mL Oral BID BM  . gabapentin   100 mg Oral TID  . phenytoin  100 mg Oral QHS  . phenytoin  30 mg Oral Once per day on Mon Wed Fri  . potassium chloride  10 mEq Oral Daily   Continuous Infusions: . sodium chloride 75 mL/hr at 03/10/17 1218  . piperacillin-tazobactam (ZOSYN)  IV 3.375 g (03/10/17 1305)   PRN Meds:.bisacodyl, fentaNYL (SUBLIMAZE) injection, HYDROcodone-acetaminophen, morphine injection, polyethylene glycol Allergies  Allergen Reactions  . Vicodin [Hydrocodone-Acetaminophen] Other (See Comments)    hallucinations   Review of Systems  Unable to perform ROS: Acuity of condition    Physical Exam  Constitutional: She appears well-developed. She appears lethargic. She appears ill.  Thin, frail, elderly  HENT:  Head: Normocephalic and atraumatic.  Cardiovascular: Normal rate and regular rhythm.   Pulmonary/Chest: Effort normal. No accessory muscle usage. No tachypnea. No respiratory distress.  Abdominal: Normal appearance.  Neurological: She appears lethargic.  Nursing note and vitals reviewed.   Vital Signs: BP (!) 195/61   Pulse 84   Temp 97.7 F (36.5 C) (Oral)   Resp 17   SpO2 100%  Pain Assessment: PAINAD   Pain Score: Asleep   SpO2: SpO2: 100 % O2 Device:SpO2: 100 % O2 Flow Rate: .   IO: Intake/output summary:  Intake/Output Summary (Last 24 hours) at 03/10/17 1537 Last data filed at 03/10/17 1305  Gross per 24 hour  Intake               50 ml  Output              700 ml  Net             -650 ml    LBM:   Baseline Weight:   Most recent weight:       Palliative Assessment/Data:      Time Total: 32mn  Greater than 50%  of this time was spent counseling and coordinating care related to the above assessment and plan.  Signed by: AVinie Sill NP Palliative Medicine Team Pager # [phone](M-F 8a-5p) Team Phone # [phone](Nights/Weekends)

## 2017-03-10 NOTE — Progress Notes (Signed)
Admitted to 6n05 from [facility] by [facility] at this time.

## 2017-03-10 NOTE — Progress Notes (Signed)
Initial Nutrition Assessment  DOCUMENTATION CODES:   Not applicable  INTERVENTION:   -Ensure Enlive po BID, each supplement provides 350 kcal and 20 grams of protein  NUTRITION DIAGNOSIS:   Increased nutrient needs related to wound healing as evidenced by estimated needs (lt gangrenous toe).  GOAL:   Patient will meet greater than or equal to 90% of their needs  MONITOR:   PO intake, Supplement acceptance, Labs, Weight trends, Skin, I & O's  REASON FOR ASSESSMENT:   Consult Hip fracture protocol  ASSESSMENT:   Kayla Collier is a 83 y.o. female with a past medical history significant for seizures and HTN and LEFT toe gangrene who presents with hip pain after a fall.  Pt admitted with hip fracture.   Case discussed with RN, who reports pt consumed applesauce well this AM. Orthopedics eval pending regarding treatment plan.   Case discussed with pt daughter, who reports pt typically has a very good appetite. However, appetite has declined over the past 2 weeks, due to being transferred to SNF. Pt eats with dentures, has no diet restrictions PTA, and enjoys sweet flavored foods. Prior to SNF stay, pt consumed 1-2 bottles of Ensure daily.   Per pt daughter, UBW 98-105#. Pt has always been petite framed.   Nutrition-Focused physical exam completed. Findings are mild fat depletion, mild muscle depletion, and no edema. Suspect muscle and fat depletion are related to advanced age, which is typical for this population.  Pt daughter requesting Ensure supplements during afternoon and evening to optimize nutritional status. RD to order.   Labs reviewed.   Diet Order:  Diet Heart Room service appropriate? Yes; Fluid consistency: Thin  Skin:  Wound (see comment) (necrotic gangrene lt toe)  Last BM:  PTA  Height:   Ht Readings from Last 1 Encounters:  03/10/17 5' (1.524 m)    Weight:   Wt Readings from Last 1 Encounters:  03/10/17 100 lb (45.4 kg)    Ideal Body Weight:   45.4 kg  BMI:  Body mass index is 19.53 kg/m.  Estimated Nutritional Needs:   Kcal:  1100-1300  Protein:  45-55 grams  Fluid:  1.1-1.3 L  EDUCATION NEEDS:   No education needs identified at this time  Makenli Derstine A. Mayford Knife, RD, LDN, CDE Pager: [phone] After hours Pager: [phone]

## 2017-03-10 NOTE — ED Triage Notes (Signed)
BIB EMS from [facility] w/ c/o of ground level fall w/ right hip injury and shortening. Pt denies hitting her head. Pt received 100 mcg of Fentanyl in route.   EMS Vitals BP 183/69 HR 73 RR 16 96% on RA

## 2017-03-10 NOTE — Progress Notes (Signed)
Bright red blood noted in foley during bedside reporting. Day shift RN stated this was a new finding. On call NP notified. No new orders given. Will continue to monitor. Cindee Salt, RN

## 2017-03-10 NOTE — ED Notes (Signed)
Pt refused to eat or drink when offered.

## 2017-03-11 ENCOUNTER — Inpatient Hospital Stay (HOSPITAL_COMMUNITY): Payer: Medicare Other | Admitting: Anesthesiology

## 2017-03-11 ENCOUNTER — Encounter (HOSPITAL_COMMUNITY): Payer: Self-pay | Admitting: Certified Registered Nurse Anesthetist

## 2017-03-11 ENCOUNTER — Inpatient Hospital Stay (HOSPITAL_COMMUNITY): Payer: Medicare Other

## 2017-03-11 ENCOUNTER — Encounter (HOSPITAL_COMMUNITY)
Admission: EM | Disposition: A | Discharge status: Skilled Nursing Facility | Payer: Self-pay | Source: Home / Self Care | Attending: Internal Medicine

## 2017-03-11 DIAGNOSIS — S72141A Displaced intertrochanteric fracture of right femur, initial encounter for closed fracture: Secondary | ICD-10-CM

## 2017-03-11 HISTORY — PX: INTRAMEDULLARY (IM) NAIL INTERTROCHANTERIC: SHX5875

## 2017-03-11 LAB — PREPARE RBC (CROSSMATCH)

## 2017-03-11 LAB — CBC
HCT: 24.1 % — ABNORMAL LOW (ref 36.0–46.0)
Hemoglobin: 7.9 g/dL — ABNORMAL LOW (ref 12.0–15.0)
MCH: 29 pg (ref 26.0–34.0)
MCHC: 32.8 g/dL (ref 30.0–36.0)
MCV: 88.6 fL (ref 78.0–100.0)
PLATELETS: 319 10*3/uL (ref 150–400)
RBC: 2.72 MIL/uL — AB (ref 3.87–5.11)
RDW: 14.5 % (ref 11.5–15.5)
WBC: 10.5 10*3/uL (ref 4.0–10.5)

## 2017-03-11 LAB — COMPREHENSIVE METABOLIC PANEL
ALT: 14 U/L (ref 14–54)
ANION GAP: 8 (ref 5–15)
AST: 27 U/L (ref 15–41)
Albumin: 2.2 g/dL — ABNORMAL LOW (ref 3.5–5.0)
Alkaline Phosphatase: 86 U/L (ref 38–126)
BUN: 12 mg/dL (ref 6–20)
CALCIUM: 8.2 mg/dL — AB (ref 8.9–10.3)
CHLORIDE: 98 mmol/L — AB (ref 101–111)
CO2: 27 mmol/L (ref 22–32)
CREATININE: 0.55 mg/dL (ref 0.44–1.00)
Glucose, Bld: 106 mg/dL — ABNORMAL HIGH (ref 65–99)
Potassium: 2.9 mmol/L — ABNORMAL LOW (ref 3.5–5.1)
Sodium: 133 mmol/L — ABNORMAL LOW (ref 135–145)
Total Bilirubin: 0.4 mg/dL (ref 0.3–1.2)
Total Protein: 5.3 g/dL — ABNORMAL LOW (ref 6.5–8.1)

## 2017-03-11 LAB — POCT I-STAT 4, (NA,K, GLUC, HGB,HCT)
GLUCOSE: 104 mg/dL — AB (ref 65–99)
HCT: 24 % — ABNORMAL LOW (ref 36.0–46.0)
HEMOGLOBIN: 8.2 g/dL — AB (ref 12.0–15.0)
POTASSIUM: 3.2 mmol/L — AB (ref 3.5–5.1)
Sodium: 136 mmol/L (ref 135–145)

## 2017-03-11 LAB — MAGNESIUM: MAGNESIUM: 1.5 mg/dL — AB (ref 1.7–2.4)

## 2017-03-11 SURGERY — FIXATION, FRACTURE, INTERTROCHANTERIC, WITH INTRAMEDULLARY ROD
Anesthesia: General | Site: Leg Upper | Laterality: Right

## 2017-03-11 MED ORDER — ONDANSETRON HCL 4 MG/2ML IJ SOLN: 4.0000 mg | Freq: Four times a day (QID) | INTRAMUSCULAR | Status: DC | PRN

## 2017-03-11 MED ORDER — POTASSIUM CHLORIDE CRYS ER 20 MEQ PO TBCR
40.0000 meq | EXTENDED_RELEASE_TABLET | Freq: Once | ORAL | Status: AC
  Administered 2017-03-11: 40 meq via ORAL
  Filled 2017-03-11: qty 2

## 2017-03-11 MED ORDER — MEPERIDINE HCL 25 MG/ML IJ SOLN: 6.2500 mg | INTRAMUSCULAR | Status: DC | PRN

## 2017-03-11 MED ORDER — ONDANSETRON HCL 4 MG PO TABS: 4.0000 mg | ORAL_TABLET | Freq: Four times a day (QID) | ORAL | Status: DC | PRN

## 2017-03-11 MED ORDER — POVIDONE-IODINE 10 % EX SWAB: 2.0000 "application " | Freq: Once | CUTANEOUS | Status: DC

## 2017-03-11 MED ORDER — EPHEDRINE 5 MG/ML INJ
INTRAVENOUS | Status: AC
  Filled 2017-03-11: qty 10

## 2017-03-11 MED ORDER — ENOXAPARIN SODIUM 30 MG/0.3ML ~~LOC~~ SOLN
30.0000 mg | SUBCUTANEOUS | Status: DC
  Administered 2017-03-12 – 2017-03-15 (×4): 30 mg via SUBCUTANEOUS
  Filled 2017-03-11 (×4): qty 0.3

## 2017-03-11 MED ORDER — METHOCARBAMOL 500 MG PO TABS
500.0000 mg | ORAL_TABLET | Freq: Four times a day (QID) | ORAL | Status: DC | PRN
  Administered 2017-03-12 – 2017-03-14 (×4): 500 mg via ORAL
  Filled 2017-03-11 (×4): qty 1

## 2017-03-11 MED ORDER — SODIUM CHLORIDE 0.9 % IV SOLN
Freq: Once | INTRAVENOUS | Status: AC
  Administered 2017-03-11: 09:00:00 via INTRAVENOUS

## 2017-03-11 MED ORDER — SUCCINYLCHOLINE CHLORIDE 200 MG/10ML IV SOSY
PREFILLED_SYRINGE | INTRAVENOUS | Status: AC
  Filled 2017-03-11: qty 30

## 2017-03-11 MED ORDER — PHENOL 1.4 % MT LIQD: 1.0000 | OROMUCOSAL | Status: DC | PRN

## 2017-03-11 MED ORDER — LACTATED RINGERS IV SOLN
INTRAVENOUS | Status: DC
  Administered 2017-03-11: 13:00:00 via INTRAVENOUS

## 2017-03-11 MED ORDER — ONDANSETRON HCL 4 MG/2ML IJ SOLN: 4.0000 mg | Freq: Once | INTRAMUSCULAR | Status: DC | PRN

## 2017-03-11 MED ORDER — PHENYTOIN SODIUM EXTENDED 30 MG PO CAPS
30.0000 mg | ORAL_CAPSULE | ORAL | Status: DC
  Administered 2017-03-12: 30 mg via ORAL
  Filled 2017-03-11 (×2): qty 1

## 2017-03-11 MED ORDER — HYDROMORPHONE HCL 1 MG/ML IJ SOLN: 0.2500 mg | INTRAMUSCULAR | Status: DC | PRN

## 2017-03-11 MED ORDER — ROCURONIUM BROMIDE 100 MG/10ML IV SOLN
INTRAVENOUS | Status: DC | PRN
  Administered 2017-03-11: 10 mg via INTRAVENOUS
  Administered 2017-03-11: 20 mg via INTRAVENOUS

## 2017-03-11 MED ORDER — LABETALOL HCL 5 MG/ML IV SOLN
INTRAVENOUS | Status: AC
  Filled 2017-03-11: qty 4

## 2017-03-11 MED ORDER — SODIUM CHLORIDE 0.9 % IV SOLN
INTRAVENOUS | Status: DC
  Administered 2017-03-11: 16:00:00 via INTRAVENOUS

## 2017-03-11 MED ORDER — POTASSIUM CHLORIDE IN NACL 40-0.9 MEQ/L-% IV SOLN
INTRAVENOUS | Status: DC
  Administered 2017-03-11: 75 mL/h via INTRAVENOUS
  Administered 2017-03-12 – 2017-03-14 (×4): 50 mL/h via INTRAVENOUS
  Filled 2017-03-11 (×7): qty 1000

## 2017-03-11 MED ORDER — ACETAMINOPHEN 325 MG PO TABS
650.0000 mg | ORAL_TABLET | Freq: Four times a day (QID) | ORAL | Status: DC | PRN
  Administered 2017-03-13: 650 mg via ORAL
  Filled 2017-03-11 (×2): qty 2

## 2017-03-11 MED ORDER — SODIUM CHLORIDE 0.9 % IV SOLN
INTRAVENOUS | Status: DC | PRN
  Administered 2017-03-11: 14:00:00 via INTRAVENOUS

## 2017-03-11 MED ORDER — LABETALOL HCL 5 MG/ML IV SOLN
INTRAVENOUS | Status: AC
  Administered 2017-03-11: 18:00:00
  Filled 2017-03-11: qty 4

## 2017-03-11 MED ORDER — METHOCARBAMOL 1000 MG/10ML IJ SOLN
500.0000 mg | Freq: Four times a day (QID) | INTRAVENOUS | Status: DC | PRN
  Administered 2017-03-15: 500 mg via INTRAVENOUS
  Filled 2017-03-11 (×2): qty 5

## 2017-03-11 MED ORDER — ROCURONIUM BROMIDE 10 MG/ML (PF) SYRINGE
PREFILLED_SYRINGE | INTRAVENOUS | Status: AC
  Filled 2017-03-11: qty 15

## 2017-03-11 MED ORDER — OXYCODONE HCL 5 MG PO TABS
5.0000 mg | ORAL_TABLET | ORAL | Status: DC | PRN
  Administered 2017-03-11: 10 mg via ORAL
  Administered 2017-03-13: 5 mg via ORAL
  Filled 2017-03-11: qty 2
  Filled 2017-03-11: qty 1

## 2017-03-11 MED ORDER — ACETAMINOPHEN 650 MG RE SUPP: 650.0000 mg | Freq: Four times a day (QID) | RECTAL | Status: DC | PRN

## 2017-03-11 MED ORDER — LIDOCAINE 2% (20 MG/ML) 5 ML SYRINGE
INTRAMUSCULAR | Status: AC
  Filled 2017-03-11: qty 5

## 2017-03-11 MED ORDER — CEFAZOLIN SODIUM-DEXTROSE 2-4 GM/100ML-% IV SOLN: 2.0000 g | INTRAVENOUS | Status: DC

## 2017-03-11 MED ORDER — LABETALOL HCL 5 MG/ML IV SOLN
10.0000 mg | INTRAVENOUS | Status: AC | PRN
  Administered 2017-03-11 (×3): 10 mg via INTRAVENOUS

## 2017-03-11 MED ORDER — TRAMADOL HCL 50 MG PO TABS: 50.0000 mg | ORAL_TABLET | Freq: Four times a day (QID) | ORAL | 0 refills | Status: DC | PRN

## 2017-03-11 MED ORDER — ONDANSETRON HCL 4 MG/2ML IJ SOLN
INTRAMUSCULAR | Status: DC | PRN
  Administered 2017-03-11: 4 mg via INTRAVENOUS

## 2017-03-11 MED ORDER — METOCLOPRAMIDE HCL 5 MG PO TABS: 5.0000 mg | ORAL_TABLET | Freq: Three times a day (TID) | ORAL | Status: DC | PRN

## 2017-03-11 MED ORDER — CEFAZOLIN SODIUM-DEXTROSE 2-4 GM/100ML-% IV SOLN
INTRAVENOUS | Status: AC
  Filled 2017-03-11: qty 100

## 2017-03-11 MED ORDER — PHENYLEPHRINE HCL 10 MG/ML IJ SOLN
INTRAVENOUS | Status: DC | PRN
  Administered 2017-03-11: 25 ug/min via INTRAVENOUS

## 2017-03-11 MED ORDER — 0.9 % SODIUM CHLORIDE (POUR BTL) OPTIME
TOPICAL | Status: DC | PRN
  Administered 2017-03-11: 1000 mL

## 2017-03-11 MED ORDER — PROPOFOL 10 MG/ML IV BOLUS
INTRAVENOUS | Status: DC | PRN
  Administered 2017-03-11: 80 mg via INTRAVENOUS

## 2017-03-11 MED ORDER — MORPHINE SULFATE (PF) 4 MG/ML IV SOLN
0.5000 mg | INTRAVENOUS | Status: DC | PRN
  Administered 2017-03-12 – 2017-03-14 (×4): 0.52 mg via INTRAVENOUS
  Filled 2017-03-11 (×3): qty 1

## 2017-03-11 MED ORDER — TRANEXAMIC ACID 1000 MG/10ML IV SOLN: 1000.0000 mg | INTRAVENOUS | Status: DC

## 2017-03-11 MED ORDER — ALUM & MAG HYDROXIDE-SIMETH 200-200-20 MG/5ML PO SUSP: 30.0000 mL | ORAL | Status: DC | PRN

## 2017-03-11 MED ORDER — LIDOCAINE HCL (CARDIAC) 20 MG/ML IV SOLN
INTRAVENOUS | Status: DC | PRN
  Administered 2017-03-11: 100 mg via INTRAVENOUS

## 2017-03-11 MED ORDER — METOCLOPRAMIDE HCL 5 MG/ML IJ SOLN: 5.0000 mg | Freq: Three times a day (TID) | INTRAMUSCULAR | Status: DC | PRN

## 2017-03-11 MED ORDER — PHENYLEPHRINE 40 MCG/ML (10ML) SYRINGE FOR IV PUSH (FOR BLOOD PRESSURE SUPPORT)
PREFILLED_SYRINGE | INTRAVENOUS | Status: AC
  Filled 2017-03-11: qty 10

## 2017-03-11 MED ORDER — SUGAMMADEX SODIUM 200 MG/2ML IV SOLN
INTRAVENOUS | Status: DC | PRN
  Administered 2017-03-11: 100 mg via INTRAVENOUS

## 2017-03-11 MED ORDER — ENOXAPARIN SODIUM 40 MG/0.4ML ~~LOC~~ SOLN: 40.0000 mg | SUBCUTANEOUS | Status: DC

## 2017-03-11 MED ORDER — CEFAZOLIN SODIUM-DEXTROSE 2-3 GM-% IV SOLR
INTRAVENOUS | Status: DC | PRN
  Administered 2017-03-11: 2 g via INTRAVENOUS

## 2017-03-11 MED ORDER — SODIUM CHLORIDE 0.9 % IV SOLN: Freq: Once | INTRAVENOUS | Status: DC

## 2017-03-11 MED ORDER — CEFAZOLIN SODIUM-DEXTROSE 2-4 GM/100ML-% IV SOLN
2.0000 g | Freq: Four times a day (QID) | INTRAVENOUS | Status: AC
  Administered 2017-03-11 – 2017-03-12 (×3): 2 g via INTRAVENOUS
  Filled 2017-03-11 (×3): qty 100

## 2017-03-11 MED ORDER — MENTHOL 3 MG MT LOZG: 1.0000 | LOZENGE | OROMUCOSAL | Status: DC | PRN

## 2017-03-11 MED ORDER — HYDROCODONE-ACETAMINOPHEN 5-325 MG PO TABS
1.0000 | ORAL_TABLET | Freq: Four times a day (QID) | ORAL | Status: DC | PRN
  Administered 2017-03-12: 2 via ORAL
  Administered 2017-03-12 (×2): 1 via ORAL
  Administered 2017-03-12: 2 via ORAL
  Administered 2017-03-13: 1 via ORAL
  Administered 2017-03-13: 2 via ORAL
  Administered 2017-03-14: 1 via ORAL
  Administered 2017-03-14 – 2017-03-15 (×4): 2 via ORAL
  Filled 2017-03-11 (×2): qty 2
  Filled 2017-03-11: qty 1
  Filled 2017-03-11 (×2): qty 2
  Filled 2017-03-11 (×2): qty 1
  Filled 2017-03-11 (×3): qty 2

## 2017-03-11 MED ORDER — ENOXAPARIN SODIUM 40 MG/0.4ML ~~LOC~~ SOLN: 40.0000 mg | Freq: Every day | SUBCUTANEOUS | 0 refills | Status: DC

## 2017-03-11 SURGICAL SUPPLY — 41 items
BIT DRILL SHORT 4.0 (BIT) ×2 IMPLANT
BNDG COHESIVE 4X5 TAN NS LF (GAUZE/BANDAGES/DRESSINGS) ×2 IMPLANT
BNDG COHESIVE 6X5 TAN STRL LF (GAUZE/BANDAGES/DRESSINGS) IMPLANT
BNDG GAUZE ELAST 4 BULKY (GAUZE/BANDAGES/DRESSINGS) ×2 IMPLANT
COVER PERINEAL POST (MISCELLANEOUS) ×2 IMPLANT
COVER SURGICAL LIGHT HANDLE (MISCELLANEOUS) ×2 IMPLANT
DRAPE INCISE IOBAN 66X45 STRL (DRAPES) ×2 IMPLANT
DRAPE STERI IOBAN 125X83 (DRAPES) ×2 IMPLANT
DRILL BIT SHORT 4.0 (BIT) ×2
DRSG MEPILEX BORDER 4X4 (GAUZE/BANDAGES/DRESSINGS) ×4 IMPLANT
DRSG MEPILEX BORDER 4X8 (GAUZE/BANDAGES/DRESSINGS) ×2 IMPLANT
DRSG PAD ABDOMINAL 8X10 ST (GAUZE/BANDAGES/DRESSINGS) ×4 IMPLANT
DURAPREP 26ML APPLICATOR (WOUND CARE) ×2 IMPLANT
ELECT REM PT RETURN 9FT ADLT (ELECTROSURGICAL) ×2
ELECTRODE REM PT RTRN 9FT ADLT (ELECTROSURGICAL) ×1 IMPLANT
GLOVE SKINSENSE NS SZ7.5 (GLOVE) ×2
GLOVE SKINSENSE STRL SZ7.5 (GLOVE) ×2 IMPLANT
GOWN STRL REIN XL XLG (GOWN DISPOSABLE) ×2 IMPLANT
GUIDE PIN 3.2X343 (PIN) ×1
GUIDE PIN 3.2X343MM (PIN) ×1
KIT BASIN OR (CUSTOM PROCEDURE TRAY) ×2 IMPLANT
KIT ROOM TURNOVER OR (KITS) ×2 IMPLANT
MANIFOLD NEPTUNE II (INSTRUMENTS) ×2 IMPLANT
NAIL TRIGEN 10MMX36CM-125 RT (Nail) ×2 IMPLANT
NS IRRIG 1000ML POUR BTL (IV SOLUTION) ×2 IMPLANT
PACK GENERAL/GYN (CUSTOM PROCEDURE TRAY) ×2 IMPLANT
PAD ARMBOARD 7.5X6 YLW CONV (MISCELLANEOUS) ×4 IMPLANT
PAD CAST 4YDX4 CTTN HI CHSV (CAST SUPPLIES) ×2 IMPLANT
PADDING CAST COTTON 4X4 STRL (CAST SUPPLIES) ×2
PIN GUIDE 3.2X343MM (PIN) ×1 IMPLANT
SCREW LAG COMPR KIT 85/80 (Screw) ×2 IMPLANT
SCREW TRIGEN LOW PROF 5.0X37.5 (Screw) ×2 IMPLANT
SCREW TRIGEN LOW PROF 5.0X40 (Screw) ×2 IMPLANT
STAPLER VISISTAT 35W (STAPLE) ×2 IMPLANT
SUT VIC AB 0 CT1 27 (SUTURE) ×1
SUT VIC AB 0 CT1 27XBRD ANBCTR (SUTURE) ×1 IMPLANT
SUT VIC AB 2-0 CT1 27 (SUTURE) ×1
SUT VIC AB 2-0 CT1 TAPERPNT 27 (SUTURE) ×1 IMPLANT
TOWEL OR 17X24 6PK STRL BLUE (TOWEL DISPOSABLE) ×2 IMPLANT
TOWEL OR 17X26 10 PK STRL BLUE (TOWEL DISPOSABLE) ×2 IMPLANT
WATER STERILE IRR 1000ML POUR (IV SOLUTION) ×2 IMPLANT

## 2017-03-11 NOTE — Consult Note (Signed)
ORTHOPAEDIC CONSULTATION  REQUESTING PHYSICIAN: Richarda Overlie, MD  Chief Complaint: Right hip fracture  HPI: Kayla Collier is a 83 y.o. female who presents with right hip fracture s/p mechanical fall PTA.  Patient is unable to provide her own history.  She lives at nursing home and has advanced dementia.  Caregiver is at bedside but couldn't reach daughter for info.  Past Medical History:  Diagnosis Date  . Anxiety   . Arthritis    RA  . Closed fracture head of femur (HCC) 03/10/2017  . Gangrene (HCC)   . Hypertension   . Seizures (HCC)    Past Surgical History:  Procedure Laterality Date  . CATARACT EXTRACTION    . TOTAL HIP ARTHROPLASTY     Social History   Social History  . Marital status: Widowed    Spouse name: N/A  . Number of children: N/A  . Years of education: N/A   Social History Main Topics  . Smoking status: Never Smoker  . Smokeless tobacco: Never Used  . Alcohol use No  . Drug use: No  . Sexual activity: Not Asked   Other Topics Concern  . None   Social History Narrative  . None   Family History  Problem Relation Age of Onset  . Family history unknown: Yes   Allergies  Allergen Reactions  . Vicodin [Hydrocodone-Acetaminophen] Other (See Comments)    hallucinations   Prior to Admission medications   Medication Sig Start Date End Date Taking? Authorizing Provider  acetaminophen (TYLENOL) 500 MG tablet Take 1,000 mg by mouth 2 (two) times daily.   Yes [provider]  amLODipine (NORVASC) 5 MG tablet Take 5 mg by mouth daily.   Yes [provider]  aspirin EC 81 MG tablet Take 81 mg by mouth daily.   Yes [provider]  atenolol (TENORMIN) 50 MG tablet Take 50 mg by mouth daily.   Yes [provider]  cholecalciferol (VITAMIN D) 1000 units tablet Take 1,000 Units by mouth daily.   Yes [provider]  gabapentin (NEURONTIN) 100 MG capsule Take 100 mg by mouth 3 (three) times daily.   Yes  [provider]  Misc Natural Products (TART CHERRY ADVANCED) CAPS Take 1 capsule by mouth daily.   Yes [provider]  oxyCODONE (OXY IR/ROXICODONE) 5 MG immediate release tablet Take 1 tablet (5 mg total) by mouth every 4 (four) hours as needed for severe pain. 02/23/17  Yes Sudini, Wardell Heath, MD  phenytoin (DILANTIN) 100 MG ER capsule Take 100 mg by mouth at bedtime.    Yes [provider]  phenytoin (DILANTIN) 30 MG ER capsule Take 30 mg by mouth 3 (three) times a week. Take at bedtime with 100mg  on Monday, Wednesday, and Friday   Yes [provider]  potassium chloride (MICRO-K) 10 MEQ CR capsule Take 10 mEq by mouth daily.    Yes [provider]  vitamin B-12 (CYANOCOBALAMIN) 1000 MCG tablet Take 1,000 mcg by mouth daily.   Yes [provider]  vitamin E (VITAMIN E) 400 UNIT capsule Take 400 Units by mouth daily.   Yes [provider]   Ct Head Wo Contrast  Result Date: 03/10/2017 CLINICAL DATA:  Fall. EXAM: CT HEAD WITHOUT CONTRAST TECHNIQUE: Contiguous axial images were obtained from the base of the skull through the vertex without intravenous contrast. COMPARISON:  05/10/2007 FINDINGS: Brain: Diffuse cerebral atrophy. Ventricular dilatation consistent with central atrophy. Low-attenuation changes in the deep white matter consistent with  small vessel ischemia. No mass effect or midline shift. No abnormal extra-axial fluid collections. Gray-white matter junctions are distinct. Basal cisterns are not effaced. No acute intracranial hemorrhage. Vascular: Vascular calcifications are present. Skull: Calvarium appears intact. Sinuses/Orbits: Old deformities of the medial orbital walls bilaterally, likely representing old fracture deformities. Paranasal sinuses and mastoid air cells are clear. Postoperative changes in the globes. Other: No significant change since prior study. IMPRESSION: No acute intracranial abnormalities. Chronic atrophy and  small vessel ischemic changes. Electronically Signed   By: Burman Nieves M.D.   On: 03/10/2017 02:46   Dg Hip Unilat  With Pelvis 2-3 Views Right  Result Date: 03/10/2017 CLINICAL DATA:  Status post fall, with right hip deformity. Initial encounter. EXAM: DG HIP (WITH OR WITHOUT PELVIS) 2-3V RIGHT COMPARISON:  None. FINDINGS: There is a mildly comminuted right femoral intertrochanteric fracture, with overlying soft tissue swelling and medial angulation of the distal femur. No definite additional fractures are seen. Left femoral hardware is grossly unremarkable in appearance, without evidence of loosening. There is mild chronic deformity of the left inferior pubic ramus. The sacroiliac joints are grossly unremarkable in appearance. The visualized bowel gas pattern is unremarkable. Scattered vascular calcifications are seen. IMPRESSION: 1. Mildly comminuted right femoral intertrochanteric fracture, with overlying soft tissue swelling and medial angulation of the distal femur. 2. Scattered vascular calcifications seen. Electronically Signed   By: Roanna Raider M.D.   On: 03/10/2017 02:43   Dg Femur Min 2 Views Right  Result Date: 03/10/2017 CLINICAL DATA:  Status post fall, with right hip deformity. Initial encounter. EXAM: RIGHT FEMUR 2 VIEWS COMPARISON:  None. FINDINGS: The comminuted right femoral intertrochanteric fracture is characterized on right hip radiographs. The distal right femur appears intact. No knee joint effusion is seen. Diffuse vascular calcifications are noted. No significant degenerative change is noted at the right knee. IMPRESSION: 1. Comminuted right femoral intertrochanteric fracture. 2. Diffuse vascular calcifications seen. Electronically Signed   By: Roanna Raider M.D.   On: 03/10/2017 02:44    All pertinent xrays, MRI, CT independently reviewed and interpreted  Positive ROS: All other systems have been reviewed and were otherwise negative with the exception of those mentioned  in the HPI and as above.  Physical Exam: General: Alert, no acute distress Cardiovascular: No pedal edema Respiratory: No cyanosis, no use of accessory musculature GI: No organomegaly, abdomen is soft and non-tender Skin: No lesions in the area of chief complaint Neurologic: Sensation intact distally Psychiatric: Patient is competent for consent with normal mood and affect Lymphatic: No axillary or cervical lymphadenopathy  MUSCULOSKELETAL:  - pain with movement of the hip and extremity - skin intact - NVI distally - compartments soft  Assessment: Right intertroch hip fracture  Plan: - surgery is recommended and planned for this afternoon - medically optimized - she has dry toe gangrene and does not need urgent attention per vascular surgery - 1 unit of RBCs ordered    Thank you for the consult and the opportunity to see Kayla Collier  N. Glee Arvin, MD [facility] [phone] 7:42 AM

## 2017-03-11 NOTE — Anesthesia Preprocedure Evaluation (Signed)
Anesthesia Evaluation  Patient identified by MRN, date of birth, ID band Patient awake    Reviewed: Allergy & Precautions, NPO status   Airway Mallampati: I  TM Distance: >3 FB Neck ROM: Full    Dental   Pulmonary    Pulmonary exam normal        Cardiovascular hypertension, Pt. on medications Normal cardiovascular exam     Neuro/Psych Anxiety    GI/Hepatic   Endo/Other    Renal/GU      Musculoskeletal   Abdominal   Peds  Hematology   Anesthesia Other Findings   Reproductive/Obstetrics                             Anesthesia Physical Anesthesia Plan  ASA: III  Anesthesia Plan: General   Post-op Pain Management:    Induction: Intravenous  Airway Management Planned: Oral ETT  Additional Equipment:   Intra-op Plan:   Post-operative Plan: Extubation in OR  Informed Consent: I have reviewed the patients History and Physical, chart, labs and discussed the procedure including the risks, benefits and alternatives for the proposed anesthesia with the patient or authorized representative who has indicated his/her understanding and acceptance.     Plan Discussed with: CRNA and Surgeon  Anesthesia Plan Comments:         Anesthesia Quick Evaluation

## 2017-03-11 NOTE — Anesthesia Procedure Notes (Signed)
Procedure Name: Intubation Date/Time: 03/11/2017 3:24 PM Performed by: Oletta Lamas Pre-anesthesia Checklist: Patient identified, Emergency Drugs available, Suction available and Patient being monitored Patient Re-evaluated:Patient Re-evaluated prior to inductionOxygen Delivery Method: Circle System Utilized Preoxygenation: Pre-oxygenation with 100% oxygen Intubation Type: IV induction Ventilation: Mask ventilation without difficulty Laryngoscope Size: Mac and 3 Grade View: Grade I Tube type: Oral Tube size: 7.0 mm Number of attempts: 1 Airway Equipment and Method: Stylet Placement Confirmation: ETT inserted through vocal cords under direct vision,  positive ETCO2 and breath sounds checked- equal and bilateral Secured at: 22 cm Tube secured with: Tape Dental Injury: Teeth and Oropharynx as per pre-operative assessment

## 2017-03-11 NOTE — Op Note (Signed)
   Date of Surgery: 03/11/2017  INDICATIONS: Kayla Collier is a 83 y.o.-year-old female who sustained a right hip fracture. The risks and benefits of the procedure discussed with the patient prior to the procedure and all questions were answered; consent was obtained.  PREOPERATIVE DIAGNOSIS: right hip fracture   POSTOPERATIVE DIAGNOSIS: Same   PROCEDURE: Treatment of pertrochanteric fracture with intramedullary implant. CPT [phone]   SURGEON: N. Glee Arvin, M.D.   ANESTHESIA: general   IV FLUIDS AND URINE: See anesthesia record   ESTIMATED BLOOD LOSS: 200 cc  IMPLANTS: [employer] InterTAN 10 x 36, 85/80  DRAINS: None.   COMPLICATIONS: None.   DESCRIPTION OF PROCEDURE: The patient was brought to the operating room and placed supine on the operating table. The patient's leg had been signed prior to the procedure. The patient had the anesthesia placed by the anesthesiologist. The prep verification and incision time-outs were performed to confirm that this was the correct patient, site, side and location. The patient had an SCD on the opposite lower extremity. The patient did receive antibiotics prior to the incision and was re-dosed during the procedure as needed at indicated intervals. The patient was positioned on the fracture table with the table in traction and internal rotation to reduce the hip. The well leg was placed in a scissor position and all bony prominences were well-padded. The patient had the lower extremity prepped and draped in the standard surgical fashion. The incision was made 4 finger breadths superior to the greater trochanter. A guide pin was inserted into the tip of the greater trochanter under fluoroscopic guidance. An opening reamer was used to gain access to the femoral canal. The nail length was measured and inserted down the femoral canal to its proper depth. The appropriate version of insertion for the lag screw was found under fluoroscopy. A pin was inserted up  the femoral neck through the jig. Then, a second antirotation pin was inserted inferior to the first pin. The length of the lag screw was then measured. The lag screw was inserted as near to center-center in the head as possible. The antirotation pin was then taken out and an interdigitating compression screw was placed in its place. The leg was taken out of traction, then the interdigitating compression screw was used to compress across the fracture. Compression was visualized on serial xrays. Two distal interlocking screws were placed using the perfect circle technique.  The wound was copiously irrigated with saline and the subcutaneous layer closed with 2.0 vicryl and the skin was reapproximated with staples. The wounds were cleaned and dried a final time and a sterile dressing was placed. The hip was taken through a range of motion at the end of the case under fluoroscopic imaging to visualize the approach-withdraw phenomenon and confirm implant length in the head. The patient was then awakened from anesthesia and taken to the recovery room in stable condition. All counts were correct at the end of the case.   POSTOPERATIVE PLAN: The patient will be weight bearing as tolerated and will return in 2 weeks for staple removal and the patient will receive DVT prophylaxis based on other medications, activity level, and risk ratio of bleeding to thrombosis.   Kayla Reel, MD [facility] [phone] 1:45 PM

## 2017-03-11 NOTE — Clinical Social Work Note (Signed)
Clinical Social Work Assessment  Patient Details  Name: Kayla Collier MRN: [id number] Date of Birth: 1918-05-06  Date of referral:  03/11/17               Reason for consult:  Discharge Planning                Permission sought to share information with:  Family Supports Permission granted to share information::     Name::     Learta Codding  Agency::     Relationship::  Daughter-POA  Contact Information:  [phone]  Housing/Transportation Living arrangements for the past 2 months:  Single Family Home Source of Information:  Adult Children Patient Interpreter Needed:  None Criminal Activity/Legal Involvement Pertinent to Current Situation/Hospitalization:  No - Comment as needed Significant Relationships:  Adult Children Lives with:  Adult Children Do you feel safe going back to the place where you live?  Yes Need for family participation in patient care:  Yes (Comment) (Pt has Alzheimer's. Oriented x1. )  Care giving concerns:  No care giving concerns identified.    Social Worker assessment / plan:  CSW met with dtr-Lisa Lovena Le at bedside to address consult for SNF placement. Per RN, pt has been crying out in pain this morning, but is now sleeping comfortably. Pt has Alzheimer's and oriented x1. CSW introduced self to Ms. Lovena Le and explained SW responsibilities. Pt from home with dtr. Pt most recently was at [facility] for short term rehab due to a fall at home. Pt inpatient at this time due to a fall at [facility] and a gangrenous foot.  Pt was fairly independent prior to first fall. Per Ms. Lovena Le, pt went to the [facility] everyday and to church every Sunday. Pt used a cane or walker for mobility. Per Ms. Lovena Le, when pt was at [facility].   Pt hip surgery scheduled for today, but Ms. Lovena Le has many questions for Dr. Erlinda Hong and palliative. RN put Ms. Lovena Le in touch with Dr. Erlinda Hong. CSW paged NP Elmo Putt from Palliative. Ms. Lovena Le  uncertain as to d/c plans, but is questionable about SNF as she did not want to send pt to SNF initially. Surgery scheduled for today is uncertain at this point, as well as d/c plans. CSW continuing to provide pt and family support, as well as following for d/c planning.   Employment status:  Retired Forensic scientist:  Medicare PT Recommendations:  Not assessed at this time [place] / Referral to community resources:  Other (Comment Required) (hospice/comfort care)  Patient/Family's Response to care:  Pt sleeping, has Alzheimer's, and oriented x1. Dtr appreciative of CSW support and guidance.   Patient/Family's Understanding of and Emotional Response to Diagnosis, Current Treatment, and Prognosis:  Unable to assess pt's understanding. Dtr understands pt's diagnosis, prognosis, and current treatment. Dtr expressing concern about pt's pending surgery, but her main concern is that pt is comfortable.   Emotional Assessment Appearance:  Appears stated age Attitude/Demeanor/Rapport:  Unable to Assess Affect (typically observed):  Unable to Assess Orientation:  Oriented to Self Alcohol / Substance use:  Other Psych involvement (Current and /or in the community):  No (Comment)  Discharge Needs  Concerns to be addressed:  Discharge Planning Concerns, Care Coordination Readmission within the last 30 days:  Yes Current discharge risk:  Dependent with Mobility Barriers to Discharge:  Continued Medical Work up   [employer], LCSW 03/11/2017, 10:36 AM

## 2017-03-11 NOTE — Progress Notes (Signed)
SLP Cancellation Note  Patient Details Name: Kayla Collier MRN: [id number] DOB: [birth year]   Cancelled treatment:        NPO for surgery today. ST will check on pt tomorrow.   Royce Macadamia 03/11/2017, 9:12 AM  Breck Coons Lonell Face.Ed [employer] [phone]

## 2017-03-11 NOTE — Progress Notes (Signed)
Daily Progress Note   Patient Name: Kayla Collier       Date: 03/11/2017 DOB: [birth year]  Age: 83 y.o. MRN#: [id number] Attending Physician: Reyne Dumas, MD Primary Care Physician: Vernie Shanks, MD Admit Date: 03/10/2017  Reason for Consultation/Follow-up: Establishing goals of care and Psychosocial/spiritual support  Subjective: [name] was lethargic during my visit and did not want to talk with me. She was NPO for possible hip fracture repair today and did not sleep well. Her daughter, [name], was at the bedside. She had a lot of question and concerns, which we discussed (see Assessment section below).   Length of Stay: 1  Current Medications: Scheduled Meds:  . [MAR Hold] amLODipine  5 mg Oral Daily  . [MAR Hold] aspirin EC  81 mg Oral Daily  . [MAR Hold] atenolol  50 mg Oral Daily  . [MAR Hold] docusate sodium  100 mg Oral BID  . [MAR Hold] feeding supplement (ENSURE ENLIVE)  237 mL Oral BID BM  . [MAR Hold] gabapentin  100 mg Oral TID  . [MAR Hold] phenytoin  100 mg Oral QHS  . [MAR Hold] phenytoin  30 mg Oral Once per day on Mon Wed Fri  . [MAR Hold] potassium chloride  10 mEq Oral Daily  . [MAR Hold] potassium chloride  40 mEq Oral Once  . [MAR Hold] senna  1 tablet Oral QHS    Continuous Infusions: . 0.9 % NaCl with KCl 40 mEq / L    . lactated ringers    . [MAR Hold] piperacillin-tazobactam (ZOSYN)  IV Stopped (03/11/17 0751)    PRN Meds: [MAR Hold] bisacodyl, [MAR Hold] fentaNYL (SUBLIMAZE) injection, [MAR Hold] HYDROcodone-acetaminophen, [MAR Hold]  morphine injection, [MAR Hold] polyethylene glycol  Physical Exam  Constitutional: She appears lethargic.  Frail woman lying in bed  HENT:  Head: Normocephalic and atraumatic.  Mouth/Throat: Oropharynx is clear and moist. No oropharyngeal exudate.  Eyes:    Declined to open eyes  Neck: Normal range of motion.  Cardiovascular: Normal rate and regular rhythm.   Pulmonary/Chest: Effort normal and breath sounds normal. No respiratory distress.  Abdominal: Soft. Bowel sounds are normal.  Musculoskeletal:  Limited r/t right hip fracture  Neurological: She appears lethargic.  Will respond to simple questions, but stated that she did not want to talk with me  Skin: Skin is warm and dry.  Left great toe with black tip and pale/discoloration surrounding  Psychiatric:  UTA            Vital Signs: BP (!) 133/42 (BP Location: Right Arm)   Pulse 73   Temp 98.5 F (36.9 C) (Oral)   Resp 18   Ht 5' (1.524 m)   Wt 45.4 kg (100 lb)   SpO2 96%   BMI 19.53 kg/m  SpO2: SpO2: 96 % O2 Device: O2 Device: Not Delivered O2 Flow Rate:    Intake/output summary:  Intake/Output Summary (Last 24 hours) at 03/11/17 1157 Last data filed at 03/11/17 0849  Gross per 24 hour  Intake          1261.25 ml  Output             1500 ml  Net          -  238.75 ml   LBM: Last BM Date: 03/10/17 Baseline Weight: Weight: 45.4 kg (100 lb) Most recent weight: Weight: 45.4 kg (100 lb)  Palliative Assessment/Data: PPS 30-40% based on hip fracture   Flowsheet Rows     Most Recent Value  Intake Tab  Referral Department  Hospitalist  Unit at Time of Referral  Med/Surg Unit  Palliative Care Primary Diagnosis  Neurology  Date Notified  03/10/17  Palliative Care Type  New Palliative care  Reason for referral  Clarify Goals of Care  Date of Admission  03/10/17  Date first seen by Palliative Care  03/10/17  # of days Palliative referral response time  0 Day(s)  # of days IP prior to Palliative referral  0  Clinical Assessment  Psychosocial & Spiritual Assessment  Palliative Care Outcomes      Patient Active Problem List   Diagnosis Date Noted  . Closed comminuted intertrochanteric fracture of proximal end of right femur ([place]) 03/10/2017  . Alzheimer's type  dementia with late onset without behavioral disturbance 03/10/2017  . Closed fracture of trochanter of right femur ([place])   . Goals of care, counseling/discussion   . Palliative care encounter   . 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 Gangrene of foot ([place]) 02/19/2017  . Essential hypertension 02/19/2017  . Anxiety 02/19/2017  . Seizures ([place]) 02/19/2017    Palliative Care Assessment & Plan   HPI: 83 y.o. female  with past medical history of seizures, HTN, left great toe gangrene, and dementia admitted on 03/10/2017 with fall with right hip pain and fracture.  Assessment: I met with [name]'s daughter, [name], at the bedside. [name] had just spoken with ortho, and had decided that proceeding with surgical intervention for the hip fracture was the best plan of action. She and I talked through the perceived benefit of surgery, versus the possible risks. I did emphasize the high risk for delirium post surgery (and explained what it was and why it was important to consider). We then talked about the gangrenous toe, and I explained vascular's recommendation (referring to Dr. Oneida Alar' note from 5/9). [name] understands the plan to monitor how her mother does with the hip surgery, then consider vascular intervention. Again, I emphasized the perceived benefits of intervention, versus the possible risks.   Recommendations/Plan:  DNR, plan proceed with hip fracture repair then monitor her recovery before considering vascular intervention  I will follow-up tomorrow and Palliative will continue to assist pt and daughter in decision making  Code Status:  DNR  Prognosis:   < 6 months ; given pt's age, her hip fracture, and significant PVD with a gangrenous toe, I believe her time is likely limited. I did share my concerns with [name].  Discharge Planning:  To Be Determined  Care plan was discussed with pt and her daughter  Thank you for allowing the Palliative  Medicine Team to assist in the care of this patient.  Total time: 35 minutes     Greater than 50%  of this time was spent counseling and coordinating care related to the above assessment and plan.  Charlynn Court, NP Palliative Medicine Team [phone] pager (7a-5p) Team Phone # [phone]

## 2017-03-11 NOTE — Progress Notes (Signed)
[facility] PROGRESS NOTE  Kayla Collier RPR:[id number] DOB: 09-21-1918 DOA: 03/10/2017   PCP: Ileana Ladd, MD     Assessment/Plan: Principal Problem:   Closed comminuted intertrochanteric fracture of proximal end of right femur [facility]) Active Problems:   Gangrene of foot (HCC)   Essential hypertension   Seizures (HCC)   Alzheimer's type dementia with late onset without behavioral disturbance   Closed fracture of trochanter of right femur (HCC)   Goals of care, counseling/discussion   Palliative care encounter   83 y.o.femalewith a past medical history significant for seizures and HTN and LEFT toe gangrene who presents with hip pain after a fall.Radiograph of the RIGHT hip showed a comminuted right intertrochanteric fracture  -The case was discussed withDr. Rodolph Bong recommended transfer to [facility] and evaluation for possible repair on Thursday   Assessment and Plan: 1. Hip fracture:s/p  pertrochanteric fracture with intramedullary implant 5/10 Orthopedics Dr. Roda Shutters notified by admitting physician, to evaluate for operative fixation of the RIGHThip. The patient is high medical risk given her age, uncontrolled BP, frailty, gangrenous foot. Chales Abrahams risk 2.9%. monitor on telemetry -Hydrocodone-acetaminophen or morphine as tolerated for pain -Bed rest, apply ice, document sedation and vitals per Hip fracture protocol Transfused 1 unit packed red blood cells prior to surgery -Consult to Palliative Care-discussed poor prognosis with daughter   2. Gangrene of the LEFT foot: According to Dr. Barbara Cower dew, patient has  critical limb threatening situation.  Continue local wound care. Angiogram to be scheduled as soon as possible   Evaluated by [facility] and planned for angiogram in 1 week. Patient was on Augmentin, off for 5 days. Will resume antibiotics, started patient on Zosyn pending decision about angiogram/surgery Vascular surgery consulted   Dr.  Cari Caraway to be done after right hip surgery   3. Hypertension Hypertensive at admission. -Continue atenolol, amlodipine -Continue aspirin  4. Seizures: -Continue Dilantin and gabapentin  5. Dementia: Delirium precautions:  -Lights and TV off, minimize interruptions at night -Blinds open and lights on during day -Glasses/hearing aid with patient -Frequent reorientation -PT/OT when able -Avoid sedation medications  Palliative care consultation-doubt patient would be a candidate for intervention/surgery  6. Hypokalemia Will replete prior to surgery  7. Anemia Noted to have some bleeding, around the Foley, likely from trauma, UA on 03/10/17 did not show gross hematuria Hemoglobin has dropped since admission and patient will receive one unit of packed red blood cells   DVT prophylaxsis  scd's  Code Status:  DNR    Family Communication: Discussed in detail with the patient, all imaging results, lab results explained to the patient   Disposition Plan:  Anticipated to have surgery today by orthopedics      Consultants:  Vascular  Orthopedics  Procedures:  None  Antibiotics: Anti-infectives    Start     Dose/Rate Route Frequency Ordered Stop   03/10/17 1200  piperacillin-tazobactam (ZOSYN) IVPB 3.375 g     3.375 g 12.5 mL/hr over 240 Minutes Intravenous Every 8 hours 03/10/17 1115     03/10/17 1000  amoxicillin-clavulanate (AUGMENTIN) 875-125 MG per tablet 1 tablet  Status:  Discontinued     1 tablet Oral Every 12 hours 03/10/17 0859 03/10/17 1103         HPI/Subjective: Very frail, does not follow commands   Objective: Vitals:   03/10/17 0650 03/10/17 1735 03/10/17 2041 03/11/17 0518  BP: (!) 195/61  (!) 153/41 (!) 133/42  Pulse: 84  72 73  Resp: 17  18 18   Temp:   100.3 F (37.9 C) 98.5 F (36.9 C)  TempSrc:   Oral Oral  SpO2: 100%  98% 96%  Weight:  45.4 kg (100 lb)     Height:  5' (1.524 m)      Intake/Output Summary (Last 24 hours) at 03/11/17 0832 Last data filed at 03/11/17 0537  Gross per 24 hour  Intake          1261.25 ml  Output             1400 ml  Net          -138.75 ml    Exam:  Examination:  General exam: Appears calm and comfortable  Respiratory system: Clear to auscultation. Respiratory effort normal. Cardiovascular system: S1 & S2 heard, RRR. No JVD, murmurs, rubs, gallops or clicks. No pedal edema. Gastrointestinal system: Abdomen is nondistended, soft and nontender. No organomegaly or masses felt. Normal bowel sounds heard. Central nervous system: lethargic . No focal neurological deficits. Extremities: Symmetric 5 x 5 power. Skin: No rashes, lesions or ulcers Psychiatry: Judgement imapired     Data Reviewed: I have personally reviewed following labs and imaging studies  Micro Results Recent Results (from the past 240 hour(s))  Surgical pcr screen     Status: None   Collection Time: 03/10/17  3:41 PM  Result Value Ref Range Status   MRSA, PCR NEGATIVE NEGATIVE Final   Staphylococcus aureus NEGATIVE NEGATIVE Final    Comment:        The Xpert SA Assay (FDA approved for NASAL specimens in patients over 54 years of age), is one component of a comprehensive surveillance program.  Test performance has been validated by [facility] for patients greater than or equal to 39 year old. It is not intended to diagnose infection nor to guide or monitor treatment.     Radiology Reports Ct Head Wo Contrast  Result Date: 03/10/2017 CLINICAL DATA:  Fall. EXAM: CT HEAD WITHOUT CONTRAST TECHNIQUE: Contiguous axial images were obtained from the base of the skull through the vertex without intravenous contrast. COMPARISON:  05/10/2007 FINDINGS: Brain: Diffuse cerebral atrophy. Ventricular dilatation consistent with central atrophy. Low-attenuation changes in the deep white matter consistent with small vessel ischemia. No mass  effect or midline shift. No abnormal extra-axial fluid collections. Gray-white matter junctions are distinct. Basal cisterns are not effaced. No acute intracranial hemorrhage. Vascular: Vascular calcifications are present. Skull: Calvarium appears intact. Sinuses/Orbits: Old deformities of the medial orbital walls bilaterally, likely representing old fracture deformities. Paranasal sinuses and mastoid air cells are clear. Postoperative changes in the globes. Other: No significant change since prior study. IMPRESSION: No acute intracranial abnormalities. Chronic atrophy and small vessel ischemic changes. Electronically Signed   By: 07/11/2007 M.D.   On: 03/10/2017 02:46   Dg Foot Complete Left  Result Date: 02/19/2017 CLINICAL DATA:  Pain in the left great toe for the last several weeks. Some drainage. EXAM: LEFT FOOT - COMPLETE 3+ VIEW COMPARISON:  None. FINDINGS: Distal all soft tissue swelling. No evidence of erosion or lytic change that would allow diagnosis of osteomyelitis by plain radiography. The remainder of the foot is negative except for some chronic arthritic change of the inter phalangeal joint of the small toe. IMPRESSION: Soft tissue swelling of the distal great toe. No underlying bone abnormality seen. Electronically Signed   By: 02/21/2017 M.D.   On: 02/19/2017 22:13   Dg Hip Unilat  With Pelvis 2-3  Views Right  Result Date: 03/10/2017 CLINICAL DATA:  Status post fall, with right hip deformity. Initial encounter. EXAM: DG HIP (WITH OR WITHOUT PELVIS) 2-3V RIGHT COMPARISON:  None. FINDINGS: There is a mildly comminuted right femoral intertrochanteric fracture, with overlying soft tissue swelling and medial angulation of the distal femur. No definite additional fractures are seen. Left femoral hardware is grossly unremarkable in appearance, without evidence of loosening. There is mild chronic deformity of the left inferior pubic ramus. The sacroiliac joints are grossly unremarkable in  appearance. The visualized bowel gas pattern is unremarkable. Scattered vascular calcifications are seen. IMPRESSION: 1. Mildly comminuted right femoral intertrochanteric fracture, with overlying soft tissue swelling and medial angulation of the distal femur. 2. Scattered vascular calcifications seen. Electronically Signed   By: Roanna Raider M.D.   On: 03/10/2017 02:43   Dg Femur Min 2 Views Right  Result Date: 03/10/2017 CLINICAL DATA:  Status post fall, with right hip deformity. Initial encounter. EXAM: RIGHT FEMUR 2 VIEWS COMPARISON:  None. FINDINGS: The comminuted right femoral intertrochanteric fracture is characterized on right hip radiographs. The distal right femur appears intact. No knee joint effusion is seen. Diffuse vascular calcifications are noted. No significant degenerative change is noted at the right knee. IMPRESSION: 1. Comminuted right femoral intertrochanteric fracture. 2. Diffuse vascular calcifications seen. Electronically Signed   By: Roanna Raider M.D.   On: 03/10/2017 02:44     CBC  Recent Labs Lab 03/10/17 0057 03/11/17 0344  WBC 8.9 10.5  HGB 9.6* 7.9*  HCT 28.9* 24.1*  PLT 386 319  MCV 90.0 88.6  MCH 29.9 29.0  MCHC 33.2 32.8  RDW 14.3 14.5  LYMPHSABS 1.1  --   MONOABS 0.9  --   EOSABS 0.3  --   BASOSABS 0.0  --     Chemistries   Recent Labs Lab 03/10/17 0057 03/11/17 0344  NA 135 133*  K 4.0 2.9*  CL 97* 98*  CO2 29 27  GLUCOSE 124* 106*  BUN 20 12  CREATININE 0.52 0.55  CALCIUM 9.1 8.2*  AST  --  27  ALT  --  14  ALKPHOS  --  86  BILITOT  --  0.4   ------------------------------------------------------------------------------------------------------------------ estimated creatinine clearance is 29.5 mL/min (by C-G formula based on SCr of 0.55 mg/dL). ------------------------------------------------------------------------------------------------------------------ No results for input(s): HGBA1C in the last 72  hours. ------------------------------------------------------------------------------------------------------------------ No results for input(s): CHOL, HDL, LDLCALC, TRIG, CHOLHDL, LDLDIRECT in the last 72 hours. ------------------------------------------------------------------------------------------------------------------ No results for input(s): TSH, T4TOTAL, T3FREE, THYROIDAB in the last 72 hours.  Invalid input(s): FREET3 ------------------------------------------------------------------------------------------------------------------ No results for input(s): VITAMINB12, FOLATE, FERRITIN, TIBC, IRON, RETICCTPCT in the last 72 hours.  Coagulation profile  Recent Labs Lab 03/10/17 0057  INR 1.10    No results for input(s): DDIMER in the last 72 hours.  Cardiac Enzymes No results for input(s): CKMB, TROPONINI, MYOGLOBIN in the last 168 hours.  Invalid input(s): CK ------------------------------------------------------------------------------------------------------------------ Invalid input(s): POCBNP   CBG: No results for input(s): GLUCAP in the last 168 hours.     Studies: Ct Head Wo Contrast  Result Date: 03/10/2017 CLINICAL DATA:  Fall. EXAM: CT HEAD WITHOUT CONTRAST TECHNIQUE: Contiguous axial images were obtained from the base of the skull through the vertex without intravenous contrast. COMPARISON:  05/10/2007 FINDINGS: Brain: Diffuse cerebral atrophy. Ventricular dilatation consistent with central atrophy. Low-attenuation changes in the deep white matter consistent with small vessel ischemia. No mass effect or midline shift. No abnormal extra-axial fluid collections. Gray-white matter junctions are distinct.  Basal cisterns are not effaced. No acute intracranial hemorrhage. Vascular: Vascular calcifications are present. Skull: Calvarium appears intact. Sinuses/Orbits: Old deformities of the medial orbital walls bilaterally, likely representing old fracture deformities.  Paranasal sinuses and mastoid air cells are clear. Postoperative changes in the globes. Other: No significant change since prior study. IMPRESSION: No acute intracranial abnormalities. Chronic atrophy and small vessel ischemic changes. Electronically Signed   By: Burman Nieves M.D.   On: 03/10/2017 02:46   Dg Hip Unilat  With Pelvis 2-3 Views Right  Result Date: 03/10/2017 CLINICAL DATA:  Status post fall, with right hip deformity. Initial encounter. EXAM: DG HIP (WITH OR WITHOUT PELVIS) 2-3V RIGHT COMPARISON:  None. FINDINGS: There is a mildly comminuted right femoral intertrochanteric fracture, with overlying soft tissue swelling and medial angulation of the distal femur. No definite additional fractures are seen. Left femoral hardware is grossly unremarkable in appearance, without evidence of loosening. There is mild chronic deformity of the left inferior pubic ramus. The sacroiliac joints are grossly unremarkable in appearance. The visualized bowel gas pattern is unremarkable. Scattered vascular calcifications are seen. IMPRESSION: 1. Mildly comminuted right femoral intertrochanteric fracture, with overlying soft tissue swelling and medial angulation of the distal femur. 2. Scattered vascular calcifications seen. Electronically Signed   By: Roanna Raider M.D.   On: 03/10/2017 02:43   Dg Femur Min 2 Views Right  Result Date: 03/10/2017 CLINICAL DATA:  Status post fall, with right hip deformity. Initial encounter. EXAM: RIGHT FEMUR 2 VIEWS COMPARISON:  None. FINDINGS: The comminuted right femoral intertrochanteric fracture is characterized on right hip radiographs. The distal right femur appears intact. No knee joint effusion is seen. Diffuse vascular calcifications are noted. No significant degenerative change is noted at the right knee. IMPRESSION: 1. Comminuted right femoral intertrochanteric fracture. 2. Diffuse vascular calcifications seen. Electronically Signed   By: Roanna Raider M.D.   On:  03/10/2017 02:44      No results found for: HGBA1C Lab Results  Component Value Date   CREATININE 0.55 03/11/2017       Scheduled Meds: . amLODipine  5 mg Oral Daily  . aspirin EC  81 mg Oral Daily  . atenolol  50 mg Oral Daily  . docusate sodium  100 mg Oral BID  . feeding supplement (ENSURE ENLIVE)  237 mL Oral BID BM  . gabapentin  100 mg Oral TID  . phenytoin  100 mg Oral QHS  . [START ON 03/12/2017] phenytoin  30 mg Oral Once per day on Mon Wed Fri  . potassium chloride  10 mEq Oral Daily  . senna  1 tablet Oral QHS   Continuous Infusions: . sodium chloride 75 mL/hr at 03/10/17 2044  . piperacillin-tazobactam (ZOSYN)  IV Stopped (03/11/17 0751)     LOS: 1 day    Time spent: >30 MINS    Richarda Overlie  [facility] Pager [phone]. If 7PM-7AM, please contact night-coverage at [email or web address], password [facility] 03/11/2017, 8:32 AM  LOS: 1 day

## 2017-03-11 NOTE — Anesthesia Procedure Notes (Signed)
Procedures

## 2017-03-11 NOTE — Transfer of Care (Signed)
Immediate Anesthesia Transfer of Care Note  Patient: Rumaysa Waddington  Procedure(s) Performed: Procedure(s): RIGHT INTRAMEDULLARY (IM) NAIL INTERTROCHANTERIC (Right)  Patient Location: PACU  Anesthesia Type:General  Level of Consciousness: drowsy, patient cooperative and responds to stimulation  Airway & Oxygen Therapy: Patient Spontanous Breathing and Patient connected to nasal cannula oxygen  Post-op Assessment: Report given to RN and Post -op Vital signs reviewed and stable  Post vital signs: Reviewed and stable  Last Vitals:  Vitals:   03/11/17 1410 03/11/17 1415  BP:  (!) 189/75  Pulse:  73  Resp:  11  Temp: 36.1 C     Last Pain:  Vitals:   03/11/17 0845  TempSrc:   PainSc: Asleep         Complications: No apparent anesthesia complications

## 2017-03-11 NOTE — Progress Notes (Signed)
Potassium 2.9 this AM and hemoglobin 7.9. NP on call notified and no new orders given. Cindee Salt, RN

## 2017-03-12 ENCOUNTER — Encounter (HOSPITAL_COMMUNITY): Payer: Self-pay | Admitting: Orthopaedic Surgery

## 2017-03-12 LAB — COMPREHENSIVE METABOLIC PANEL
ALBUMIN: 2.1 g/dL — AB (ref 3.5–5.0)
ALT: 14 U/L (ref 14–54)
AST: 32 U/L (ref 15–41)
Alkaline Phosphatase: 75 U/L (ref 38–126)
Anion gap: 11 (ref 5–15)
BILIRUBIN TOTAL: 0.4 mg/dL (ref 0.3–1.2)
BUN: 14 mg/dL (ref 6–20)
CO2: 25 mmol/L (ref 22–32)
CREATININE: 0.62 mg/dL (ref 0.44–1.00)
Calcium: 8 mg/dL — ABNORMAL LOW (ref 8.9–10.3)
Chloride: 100 mmol/L — ABNORMAL LOW (ref 101–111)
GFR calc Af Amer: >60 mL/min (ref >60)
GLUCOSE: 160 mg/dL — AB (ref 65–99)
Potassium: 3.8 mmol/L (ref 3.5–5.1)
Sodium: 136 mmol/L (ref 135–145)
TOTAL PROTEIN: 5.1 g/dL — AB (ref 6.5–8.1)

## 2017-03-12 LAB — CBC
HEMATOCRIT: 26 % — AB (ref 36.0–46.0)
HEMOGLOBIN: 8.6 g/dL — AB (ref 12.0–15.0)
MCH: 29.2 pg (ref 26.0–34.0)
MCHC: 33.1 g/dL (ref 30.0–36.0)
MCV: 88.1 fL (ref 78.0–100.0)
Platelets: 276 10*3/uL (ref 150–400)
RBC: 2.95 MIL/uL — ABNORMAL LOW (ref 3.87–5.11)
RDW: 15.5 % (ref 11.5–15.5)
WBC: 10.8 10*3/uL — AB (ref 4.0–10.5)

## 2017-03-12 NOTE — Consult Note (Signed)
[facility]   Subjective  - I had a bowel movement   Objective (!) 156/49 68 98.6 F (37 C) (Oral) 16 100%  Intake/Output Summary (Last 24 hours) at 03/12/17 1016 Last data filed at 03/12/17 0600  Gross per 24 hour  Intake          1636.75 ml  Output              360 ml  Net          1276.75 ml   Left first toe tip dry gangrene unchanged from 48 hrs ago  Assessment/Planning: 83 y/o F s/p recent right hip fracture and IM repair.  Has dry gangrene tip left first toe.  Severe dementia.  Currently non ambulatory.  Vascular intervention currently not urgent or emergent.  Pt not really complaining of pain in toe no infection.  Will defer any vascular intervention until she has been allowed to recover from her hip fracture.   If she is ready for d/c prior to recovering from this, she can certainly be discharged and follow up with Dr Wyn Quaker as outpt who originally evaluated her  Palliative care has evaluated her and made assessment that her lifespan may be less than 6 months.  This should be kept in mind of her overall care and aggressiveness of care.  Will continue to follow as consult will see again early next week if remains inpatient  Fabienne Bruns 03/12/2017 10:16 AM --  Laboratory Lab Results:  Recent Labs  03/11/17 0344 03/11/17 1236 03/12/17 0417  WBC 10.5  --  10.8*  HGB 7.9* 8.2* 8.6*  HCT 24.1* 24.0* 26.0*  PLT 319  --  276   BMET  Recent Labs  03/11/17 0344 03/11/17 1236 03/12/17 0417  NA 133* 136 136  K 2.9* 3.2* 3.8  CL 98*  --  100*  CO2 27  --  25  GLUCOSE 106* 104* 160*  BUN 12  --  14  CREATININE 0.55  --  0.62  CALCIUM 8.2*  --  8.0*    COAG Lab Results  Component Value Date   INR 1.10 03/10/2017   No results found for: PTT

## 2017-03-12 NOTE — Evaluation (Addendum)
Physical Therapy Evaluation Patient Details Name: Kayla Collier MRN: [id number] DOB: Oct 16, 1918 Today's Date: 03/12/2017   History of Present Illness  83 y.o. female with a past medical history significant for seizures and HTN and LEFT toe gangrene who presents with hip pain after a fall. Right IM nail 03/11/17. Discussions continue regarding left toe and possible amputation.PMH: HTN, seizures, Alzheimers, anxiety, RA. HOH. Palliative care following with ? 6 months life expectancy.  Clinical Impression  Pt admitted with above diagnosis. Pt currently with functional limitations due to the deficits listed below (see PT Problem List). Pt needed total to max assist for bed mobility and sitting eOB.  Will need SNF at d/c.  Will follow acutely.  Pt will benefit from skilled PT to increase their independence and safety with mobility to allow discharge to the venue listed below.      Follow Up Recommendations SNF    Equipment Recommendations  Other (comment) (TBA next venue)    Recommendations for Other Services       Precautions / Restrictions Precautions Precautions: Fall Restrictions Weight Bearing Restrictions: Yes RLE Weight Bearing: Weight bearing as tolerated      Mobility  Bed Mobility Overal bed mobility: Needs Assistance Bed Mobility: Supine to Sit     Supine to sit: Total assist;+2 for physical assistance     General bed mobility comments: needs assist for trunk mobility and scooting out towards EOB with use of pad.  Pt having incr pain with inability to help as much with pt also fearful and grasping bed rail.  Pt could not concentrate on assisting therapists with bed mobility therefore incr assist needed.   Pt needed total to mod assist to sit EOB leaning posteriorly needing cues to sit with upright posture which pt never achieved.  It was difficult for pt to even get feet on the floor.  Pt kept asking to lie down.  Decided it best to lie pt down and nursing can lift pt with  lift to get up as pt was not going to be able to weight bear.   Transfers                 General transfer comment: unable  Ambulation/Gait             General Gait Details: unable  Stairs            Wheelchair Mobility    Modified Rankin (Stroke Patients Only)       Balance Overall balance assessment: History of Falls;Needs assistance Sitting-balance support: Feet supported;Bilateral upper extremity supported Sitting balance-Leahy Scale: Poor Sitting balance - Comments: unable to sit without support.  See bed mobility section above                                     Pertinent Vitals/Pain Pain Assessment: Faces Faces Pain Scale: Hurts worst Pain Location: R hip and left toe Pain Descriptors / Indicators: Grimacing;Aching;Guarding;Operative site guarding Pain Intervention(s): Limited activity within patient's tolerance;Monitored during session;Premedicated before session;Repositioned;Ice applied    Home Living Family/patient expects to be discharged to:: Skilled nursing facility Living Arrangements: Children Available Help at Discharge: Family;Personal care attendant (lives with daughter; has aide 4hr/day) Type of Home: House Home Access: Stairs to enter Entrance Stairs-Rails: None Entrance Stairs-Number of Steps: 1 step, landing and then 1 step to enter home Home Layout: One level Home Equipment: Wheelchair - [employer] - 4 wheels  Prior Function Level of Independence: Needs assistance   Gait / Transfers Assistance Needed: had been having help with walking and wheelchair use  ADL's / Homemaking Assistance Needed: A - had aide 4 hours day  Comments: Daughter states she was in Rehab when the fall happened.     Hand Dominance        Extremity/Trunk Assessment   Upper Extremity Assessment Upper Extremity Assessment: Defer to OT evaluation    Lower Extremity Assessment Lower Extremity Assessment: RLE  deficits/detail;LLE deficits/detail RLE Deficits / Details: grossly 2-/5 RLE: Unable to fully assess due to pain LLE Deficits / Details: grossly 2-/5    Cervical / Trunk Assessment Cervical / Trunk Assessment: Kyphotic  Communication   Communication: HOH  Cognition Arousal/Alertness: Awake/alert Behavior During Therapy: WFL for tasks assessed/performed Overall Cognitive Status: History of cognitive impairments - at baseline                                        General Comments General comments (skin integrity, edema, etc.): Changed pts linens as pt was wet as ice pack leaked.  Cleaned pt as she had small amount of BM.     Exercises     Assessment/Plan    PT Assessment Patient needs continued PT services  PT Problem List Decreased strength;Decreased balance;Decreased mobility;Decreased safety awareness;Pain;Decreased knowledge of use of DME;Decreased knowledge of precautions;Decreased range of motion;Decreased activity tolerance       PT Treatment Interventions DME instruction;Therapeutic exercise;Functional mobility training;Therapeutic activities;Balance training;Patient/family education;Wheelchair mobility training    PT Goals (Current goals can be found in the Care Plan section)  Acute Rehab PT Goals Patient Stated Goal: to get stronger PT Goal Formulation: With patient Time For Goal Achievement: 03/26/17 Potential to Achieve Goals: Good    Frequency Min 3X/week   Barriers to discharge Inaccessible home environment;Decreased caregiver support      Co-evaluation PT/OT/SLP Co-Evaluation/Treatment: Yes Reason for Co-Treatment: For patient/therapist safety PT goals addressed during session: Mobility/safety with mobility         AM-PAC PT "6 Clicks" Daily Activity  Outcome Measure Difficulty turning over in bed (including adjusting bedclothes, sheets and blankets)?: Total Difficulty moving from lying on back to sitting on the side of the bed? :  Total Difficulty sitting down on and standing up from a chair with arms (e.g., wheelchair, bedside commode, etc,.)?: Total Help needed moving to and from a bed to chair (including a wheelchair)?: Total Help needed walking in hospital room?: Total Help needed climbing 3-5 steps with a railing? : Total 6 Click Score: 6    End of Session Equipment Utilized During Treatment: Gait belt;Oxygen Activity Tolerance: Patient limited by pain;Patient limited by fatigue Patient left: in bed;with bed alarm set;with call bell/phone within reach;with family/visitor present;with SCD's reapplied Nurse Communication: Mobility status;Need for lift equipment PT Visit Diagnosis: Unsteadiness on feet (R26.81);Repeated falls (R29.6);Muscle weakness (generalized) (M62.81);History of falling (Z91.81);Pain Pain - part of body: Hip;Ankle and joints of foot (right hip and left foot)    Time: 2376-2831 PT Time Calculation (min) (ACUTE ONLY): 32 min   Charges:   PT Evaluation $PT Eval Moderate Complexity: 1 Procedure     PT G Codes:        Brent Taillon,PT Acute Rehabilitation [phone] (pager)   Berline Lopes 03/12/2017, 2:03 PM

## 2017-03-12 NOTE — Progress Notes (Signed)
   Subjective:  Patient reports pain as moderate.  States she's hungry  Objective:   VITALS:   Vitals:   03/11/17 1552 03/11/17 2103 03/12/17 0315 03/12/17 0558  BP: (!) 199/55 139/62 (!) 149/55 (!) 156/49  Pulse: 79 70 78 68  Resp:  16 17 16   Temp: 98.1 F (36.7 C) 98.3 F (36.8 C) 98.9 F (37.2 C) 98.6 F (37 C)  TempSrc: Oral Oral Oral Oral  SpO2: 100% 98% 100% 100%  Weight:      Height:        Neurologically intact Neurovascular intact Sensation intact distally Intact pulses distally Dorsiflexion/Plantar flexion intact Incision: dressing C/D/I and no drainage No cellulitis present Compartment soft   Lab Results  Component Value Date   WBC 10.8 (H) 03/12/2017   HGB 8.6 (L) 03/12/2017   HCT 26.0 (L) 03/12/2017   MCV 88.1 03/12/2017   PLT 276 03/12/2017     Assessment/Plan:  1 Day Post-Op   - Expected postop acute blood loss anemia - will monitor for symptoms - Up with PT/OT - DVT ppx - SCDs, ambulation, lovenox - WBAT operative extremity - Pain control - will need SNF  05/12/2017 03/12/2017, 7:31 AM [phone]

## 2017-03-12 NOTE — Progress Notes (Signed)
Daily Progress Note   Patient Name: Arushi Partridge       Date: 03/12/2017 DOB: Jul 05, 1918  Age: 83 y.o. MRN#: [id number] Attending Physician: Reyne Dumas, MD Primary Care Physician: Vernie Shanks, MD Admit Date: 03/10/2017  Reason for Consultation/Follow-up: Establishing goals of care and Psychosocial/spiritual support  Subjective: Mrs. Getman was full of life and joy when I entered the room. She was delighted by my arrival and was in the process of praising God for her improved pain, family, and "the great blessings all around." She did endorse pain in her toe/foot when moving it, but was otherwise without complaints. Extensive friends/family at the bedside.  Length of Stay: 2  Current Medications: Scheduled Meds:  . amLODipine  5 mg Oral Daily  . aspirin EC  81 mg Oral Daily  . atenolol  50 mg Oral Daily  . docusate sodium  100 mg Oral BID  . enoxaparin (LOVENOX) injection  30 mg Subcutaneous Q24H  . feeding supplement (ENSURE ENLIVE)  237 mL Oral BID BM  . gabapentin  100 mg Oral TID  . phenytoin  100 mg Oral QHS  . phenytoin  30 mg Oral Once per day on Mon Wed Fri  . potassium chloride  10 mEq Oral Daily  . senna  1 tablet Oral QHS    Continuous Infusions: . 0.9 % NaCl with KCl 40 mEq / L 50 mL/hr (03/12/17 1039)  . lactated ringers    . methocarbamol (ROBAXIN)  IV    . piperacillin-tazobactam (ZOSYN)  IV 3.375 g (03/12/17 1343)    PRN Meds: acetaminophen **OR** acetaminophen, alum & mag hydroxide-simeth, bisacodyl, HYDROcodone-acetaminophen, menthol-cetylpyridinium **OR** phenol, methocarbamol **OR** methocarbamol (ROBAXIN)  IV, metoCLOPramide **OR** metoCLOPramide (REGLAN) injection, morphine injection, ondansetron **OR** ondansetron (ZOFRAN) IV, oxyCODONE, polyethylene glycol  Physical Exam  Constitutional: Vital signs  are normal. She has a sickly appearance.  Frail woman sitting up in bed  HENT:  Head: Normocephalic and atraumatic.  Mouth/Throat: Oropharynx is clear and moist. No oropharyngeal exudate.  Eyes: EOM are normal.  Declined to open eyes  Neck: Normal range of motion.  Cardiovascular: Normal rate and regular rhythm.   Pulmonary/Chest: Effort normal. No respiratory distress. She has rhonchi in the right lower field and the left lower field.  Abdominal: Soft. Bowel sounds are normal.  Musculoskeletal:  Right hip fracture repair and discomfort with movement  Neurological: She is alert.  Joyful with a great sense of humor. Oriented to person, place, a little confused on time and situation.  Skin: Skin is warm and dry.  Left great toe with black tip and pale/discoloration surrounding  Psychiatric: She has a normal mood and affect. Her speech is normal and behavior is normal. Judgment and thought content normal. Cognition and memory are impaired.  UTA           Vital Signs: BP (!) 121/49 (BP Location: Left Arm)   Pulse 73   Temp 98.1 F (36.7 C) (Oral)   Resp 16   Ht 5' (1.524 m)   Wt 45.4 kg (100 lb)   SpO2 99%   BMI 19.53 kg/m  SpO2: SpO2: 99 % O2 Device: O2 Device: Not Delivered O2 Flow Rate: O2 Flow  Rate (L/min): 2 L/min  Intake/output summary:   Intake/Output Summary (Last 24 hours) at 03/12/17 1439 Last data filed at 03/12/17 0600  Gross per 24 hour  Intake           733.75 ml  Output              210 ml  Net           523.75 ml   LBM: Last BM Date: 03/10/17 Baseline Weight: Weight: 45.4 kg (100 lb) Most recent weight: Weight: 45.4 kg (100 lb)  Palliative Assessment/Data: PPS 50%   Flowsheet Rows     Most Recent Value  Intake Tab  Referral Department  Hospitalist  Unit at Time of Referral  Med/Surg Unit  Palliative Care Primary Diagnosis  Neurology  Date Notified  03/10/17  Palliative Care Type  New Palliative care  Reason for referral  Clarify Goals of Care    Date of Admission  03/10/17  Date first seen by Palliative Care  03/10/17  # of days Palliative referral response time  0 Day(s)  # of days IP prior to Palliative referral  0  Clinical Assessment  Psychosocial & Spiritual Assessment  Palliative Care Outcomes      Patient Active Problem List   Diagnosis Date Noted  . Closed comminuted intertrochanteric fracture of proximal end of right femur ([place]) 03/10/2017  . Alzheimer's type dementia with late onset without behavioral disturbance 03/10/2017  . Closed fracture of trochanter of right femur ([place])   . Goals of care, counseling/discussion   . Palliative care encounter   . 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 Gangrene of foot ([place]) 02/19/2017  . Essential hypertension 02/19/2017  . Anxiety 02/19/2017  . Seizures ([place]) 02/19/2017    Palliative Care Assessment & Plan   HPI: 83 y.o. female  with past medical history of seizures, HTN, left great toe gangrene, and dementia admitted on 03/10/2017 with fall with right hip pain and fracture.  Assessment: Mrs. Spradlin is vivacious and delightful today. On my arrival to the room she had her friends and family laughing, and she was quick to tell me that she adored me. She could not recall who I was (we met briefly yesterday when she was quite lethargic pre-surgery), but was grateful that I took time to visit with her.   In her family/friends's presence, we talked about the recovery process post hip surgery, which included an extended period of working with physical therapy to build her strength, and focusing on good nutrition to enable healing. Mrs. Tietze was accepting of this plan and reiterated her joy that God was healing her. I also talked with [name] about Dr. Nona Dell Assessment and Plan, as detailed in his note today. I shared that he felt vascular intervention was not currently urgent or emergent, and he will defer intervention until she has recovered from  her hip fracture. [name] asked that her mother follow-up with him for her vascular needs (the plan had been to follow up with Dr. Lucky Cowboy, who had originally evaluated her).   In considering future vascular interventions, I reinforced the importance of weighing the benefits versus the risks. I emphasized that the benefit should also be considered in terms of time frame--I.e, will it help her immediately and how, or will it help later and how. This is especially important given Mrs. Asfour's age, comorbidities, and high risk for decline. [name] is very clear that amputation is not an option. The goal  is to optimize Mrs. Glosser's comfort and functional capacity.  Recommendations/Plan:  DNR, continue supportive care as she recovers from her hip surgery   [name] plans to explores rehab options this weekend, and is hopeful for discharge no earlier than Monday  [name] is hoping to speak with Vascular on Monday, which I communicated to Dr. Oneida Alar; she would also like Dr. Oneida Alar to manager her mother's vascular needs after discharge (so appt should be with him, not Dr. Lucky Cowboy)  Code Status:  DNR  Prognosis:   < 6 months ; Mrs. Mccue looks entirely different from when I saw her yesterday. That said, I remain concerned that her age coupled with her co-morbid conditions and nutrition level signify that her time is likely limited. She also has been physically and mentally declining from her caregiver's perspective.   Discharge Planning:  [place] for rehab with Palliative care service follow-up  Care plan was discussed with pt, family, and caregiver.   Thank you for allowing the Palliative Medicine Team to assist in the care of this patient.  Total time: 35 minutes     Greater than 50%  of this time was spent counseling and coordinating care related to the above assessment and plan.  Charlynn Court, NP Palliative Medicine Team [phone] pager (7a-5p) Team Phone # [phone]

## 2017-03-12 NOTE — Progress Notes (Signed)
[facility] PROGRESS NOTE  Kayla Collier ZOX:[id number] DOB: [birth year] DOA: 03/10/2017   PCP: Ileana Ladd, MD     Assessment/Plan: Principal Problem:   Closed comminuted intertrochanteric fracture of proximal end of right femur [facility]) Active Problems:   Gangrene of foot (HCC)   Essential hypertension   Seizures (HCC)   Alzheimer's type dementia with late onset without behavioral disturbance   Closed fracture of trochanter of right femur (HCC)   Goals of care, counseling/discussion   Palliative care encounter   83 y.o.femalewith a past medical history significant for seizures and HTN and LEFT toe gangrene who presents with hip pain after a fall.Radiograph of the RIGHT hip showed a comminuted right intertrochanteric fracture  -The case was discussed withDr. Rodolph Bong recommended transfer to [facility] and evaluation for possible repair on Thursday   Assessment and Plan: 1. Hip fracture:s/p  pertrochanteric fracture with intramedullary implant 5/10, performed by Dr. Roda Shutters The patient is high medical risk given her age, uncontrolled BP, frailty, gangrenous foot. Gupta risk 2.9%. Telemetry stable -Hydrocodone-acetaminophen or morphine as tolerated for pain -Bed rest, apply ice, document sedation and vitals per Hip fracture protocol Transfused 1 unit packed red blood cells prior to surgery 5/10 Palliative care following secondary to multiple comorbidities   2. Gangrene of the LEFT foot: According to Dr. Barbara Cower dew at [facility], patient has  critical limb threatening situation.  Continue local wound care. Angiogram to be scheduled as soon as possible   Evaluated by [facility] and planned for angiogram in 1 week. Patient was on Augmentin, off for 5 days. Placed back on Zosyn pending decision about angiogram of the left leg, assess possible need for amputation. Patient refuses amputation Vascular surgery consulted   Dr. Cari Caraway to be done after right hip  surgery   3. Hypertension Hypertensive at admission. -Continue atenolol, amlodipine -Continue aspirin  4. Seizures: -Continue Dilantin and gabapentin  5. Dementia: Delirium precautions:  -Lights and TV off, minimize interruptions at night -Blinds open and lights on during day -Glasses/hearing aid with patient -Frequent reorientation -PT/OT when able -Avoid sedation medications  Palliative care consultation-doubt patient would be a candidate for intervention/surgery  6. Hypokalemia Repleted  7. Anemia Noted to have some bleeding, around the Foley, likely from trauma, UA on 03/10/17 did not show gross hematuria. Urine in the Foley bag is clear. Status post 1 unit of packed red blood cells this admission, hemoglobin stable   DVT prophylaxsis  scd's, Lovenox  Code Status:  DNR    Family Communication: Discussed in detail with the patient, all imaging results, lab results explained to the patient   Disposition Plan:  Anticipated to have surgery today by orthopedics      Consultants:  Vascular  Orthopedics  Procedures:  None  Antibiotics: Anti-infectives    Start     Dose/Rate Route Frequency Ordered Stop   03/12/17 0600  ceFAZolin (ANCEF) IVPB 2g/100 mL premix  Status:  Discontinued     2 g 200 mL/hr over 30 Minutes Intravenous On call to O.R. 03/11/17 1419 03/11/17 1456   03/11/17 1900  ceFAZolin (ANCEF) IVPB 2g/100 mL premix     2 g 200 mL/hr over 30 Minutes Intravenous Every 6 hours 03/11/17 1557 03/12/17 0750   03/11/17 1202  ceFAZolin (ANCEF) 2-4 GM/100ML-% IVPB    Comments:  Forte, Lindsi   : cabinet override      03/11/17 1202 03/12/17 0014   03/10/17 1200  piperacillin-tazobactam (ZOSYN) IVPB 3.375  g     3.375 g 12.5 mL/hr over 240 Minutes Intravenous Every 8 hours 03/10/17 1115     03/10/17 1000  amoxicillin-clavulanate (AUGMENTIN) 875-125 MG per tablet 1 tablet  Status:   Discontinued     1 tablet Oral Every 12 hours 03/10/17 0859 03/10/17 1103         HPI/Subjective: Patient is much more awake and alert today, complaining of left leg pain   Objective: Vitals:   03/11/17 1552 03/11/17 2103 03/12/17 0315 03/12/17 0558  BP: (!) 199/55 139/62 (!) 149/55 (!) 156/49  Pulse: 79 70 78 68  Resp:  16 17 16   Temp: 98.1 F (36.7 C) 98.3 F (36.8 C) 98.9 F (37.2 C) 98.6 F (37 C)  TempSrc: Oral Oral Oral Oral  SpO2: 100% 98% 100% 100%  Weight:      Height:        Intake/Output Summary (Last 24 hours) at 03/12/17 0630 Last data filed at 03/12/17 0600  Gross per 24 hour  Intake          1636.75 ml  Output              460 ml  Net          1176.75 ml    Exam:  Examination:  General exam: Appears calm and comfortable  Respiratory system: Clear to auscultation. Respiratory effort normal. Cardiovascular system: S1 & S2 heard, RRR. No JVD, murmurs, rubs, gallops or clicks. No pedal edema. Gastrointestinal system: Abdomen is nondistended, soft and nontender. No organomegaly or masses felt. Normal bowel sounds heard. Central nervous system:Awake and comfortable . No focal neurological deficits. Extremities: Symmetric 5 x 5 power. Skin: No rashes, lesions or ulcers Psychiatry: Judgement imapired     Data Reviewed: I have personally reviewed following labs and imaging studies  Micro Results Recent Results (from the past 240 hour(s))  Culture, blood (routine x 2)     Status: None (Preliminary result)   Collection Time: 03/10/17 10:21 AM  Result Value Ref Range Status   Specimen Description BLOOD RIGHT HAND  Final   Special Requests IN PEDIATRIC BOTTLE Blood Culture adequate volume  Final   Culture NO GROWTH 1 DAY  Final   Report Status PENDING  Incomplete  Culture, blood (routine x 2)     Status: None (Preliminary result)   Collection Time: 03/10/17 10:31 AM  Result Value Ref Range Status   Specimen Description BLOOD RIGHT ARM  Final    Special Requests IN PEDIATRIC BOTTLE Blood Culture adequate volume  Final   Culture NO GROWTH 1 DAY  Final   Report Status PENDING  Incomplete  Surgical pcr screen     Status: None   Collection Time: 03/10/17  3:41 PM  Result Value Ref Range Status   MRSA, PCR NEGATIVE NEGATIVE Final   Staphylococcus aureus NEGATIVE NEGATIVE Final    Comment:        The Xpert SA Assay (FDA approved for NASAL specimens in patients over 45 years of age), is one component of a comprehensive surveillance program.  Test performance has been validated by [facility] for patients greater than or equal to 38 year old. It is not intended to diagnose infection nor to guide or monitor treatment.     Radiology Reports Ct Head Wo Contrast  Result Date: 03/10/2017 CLINICAL DATA:  Fall. EXAM: CT HEAD WITHOUT CONTRAST TECHNIQUE: Contiguous axial images were obtained from the base of the skull through the vertex without intravenous contrast. COMPARISON:  05/10/2007 FINDINGS: Brain: Diffuse cerebral atrophy. Ventricular dilatation consistent with central atrophy. Low-attenuation changes in the deep white matter consistent with small vessel ischemia. No mass effect or midline shift. No abnormal extra-axial fluid collections. Gray-white matter junctions are distinct. Basal cisterns are not effaced. No acute intracranial hemorrhage. Vascular: Vascular calcifications are present. Skull: Calvarium appears intact. Sinuses/Orbits: Old deformities of the medial orbital walls bilaterally, likely representing old fracture deformities. Paranasal sinuses and mastoid air cells are clear. Postoperative changes in the globes. Other: No significant change since prior study. IMPRESSION: No acute intracranial abnormalities. Chronic atrophy and small vessel ischemic changes. Electronically Signed   By: Burman Nieves M.D.   On: 03/10/2017 02:46   Dg Foot Complete Left  Result Date: 02/19/2017 CLINICAL DATA:  Pain in the left great toe for  the last several weeks. Some drainage. EXAM: LEFT FOOT - COMPLETE 3+ VIEW COMPARISON:  None. FINDINGS: Distal all soft tissue swelling. No evidence of erosion or lytic change that would allow diagnosis of osteomyelitis by plain radiography. The remainder of the foot is negative except for some chronic arthritic change of the inter phalangeal joint of the small toe. IMPRESSION: Soft tissue swelling of the distal great toe. No underlying bone abnormality seen. Electronically Signed   By: Paulina Fusi M.D.   On: 02/19/2017 22:13   Dg C-arm 1-60 Min  Result Date: 03/11/2017 CLINICAL DATA:  Pain.  Fell. EXAM: RIGHT FEMUR 2 VIEWS; DG C-ARM 61-120 MIN COMPARISON:  Plain films 03/10/2017. FINDINGS: Intraoperative C-arm films documenting placement of intramedullary rod with compression screw across a intertrochanteric fracture. Improved position and alignment. IMPRESSION: As above. Electronically Signed   By: Elsie Stain M.D.   On: 03/11/2017 14:06   Dg Hip Unilat  With Pelvis 2-3 Views Right  Result Date: 03/10/2017 CLINICAL DATA:  Status post fall, with right hip deformity. Initial encounter. EXAM: DG HIP (WITH OR WITHOUT PELVIS) 2-3V RIGHT COMPARISON:  None. FINDINGS: There is a mildly comminuted right femoral intertrochanteric fracture, with overlying soft tissue swelling and medial angulation of the distal femur. No definite additional fractures are seen. Left femoral hardware is grossly unremarkable in appearance, without evidence of loosening. There is mild chronic deformity of the left inferior pubic ramus. The sacroiliac joints are grossly unremarkable in appearance. The visualized bowel gas pattern is unremarkable. Scattered vascular calcifications are seen. IMPRESSION: 1. Mildly comminuted right femoral intertrochanteric fracture, with overlying soft tissue swelling and medial angulation of the distal femur. 2. Scattered vascular calcifications seen. Electronically Signed   By: Roanna Raider M.D.   On:  03/10/2017 02:43   Dg Femur, Min 2 Views Right  Result Date: 03/11/2017 CLINICAL DATA:  Pain.  Fell. EXAM: RIGHT FEMUR 2 VIEWS; DG C-ARM 61-120 MIN COMPARISON:  Plain films 03/10/2017. FINDINGS: Intraoperative C-arm films documenting placement of intramedullary rod with compression screw across a intertrochanteric fracture. Improved position and alignment. IMPRESSION: As above. Electronically Signed   By: Elsie Stain M.D.   On: 03/11/2017 14:06   Dg Femur Min 2 Views Right  Result Date: 03/10/2017 CLINICAL DATA:  Status post fall, with right hip deformity. Initial encounter. EXAM: RIGHT FEMUR 2 VIEWS COMPARISON:  None. FINDINGS: The comminuted right femoral intertrochanteric fracture is characterized on right hip radiographs. The distal right femur appears intact. No knee joint effusion is seen. Diffuse vascular calcifications are noted. No significant degenerative change is noted at the right knee. IMPRESSION: 1. Comminuted right femoral intertrochanteric fracture. 2. Diffuse vascular calcifications seen. Electronically Signed  By: Roanna Raider M.D.   On: 03/10/2017 02:44     CBC  Recent Labs Lab 03/10/17 0057 03/11/17 0344 03/11/17 1236 03/12/17 0417  WBC 8.9 10.5  --  10.8*  HGB 9.6* 7.9* 8.2* 8.6*  HCT 28.9* 24.1* 24.0* 26.0*  PLT 386 319  --  276  MCV 90.0 88.6  --  88.1  MCH 29.9 29.0  --  29.2  MCHC 33.2 32.8  --  33.1  RDW 14.3 14.5  --  15.5  LYMPHSABS 1.1  --   --   --   MONOABS 0.9  --   --   --   EOSABS 0.3  --   --   --   BASOSABS 0.0  --   --   --     Chemistries   Recent Labs Lab 03/10/17 0057 03/11/17 0344 03/11/17 1236 03/12/17 0417  NA 135 133* 136 136  K 4.0 2.9* 3.2* 3.8  CL 97* 98*  --  100*  CO2 29 27  --  25  GLUCOSE 124* 106* 104* 160*  BUN 20 12  --  14  CREATININE 0.52 0.55  --  0.62  CALCIUM 9.1 8.2*  --  8.0*  MG  --  1.5*  --   --   AST  --  27  --  32  ALT  --  14  --  14  ALKPHOS  --  86  --  75  BILITOT  --  0.4  --  0.4    ------------------------------------------------------------------------------------------------------------------ estimated creatinine clearance is 29.5 mL/min (by C-G formula based on SCr of 0.62 mg/dL). ------------------------------------------------------------------------------------------------------------------ No results for input(s): HGBA1C in the last 72 hours. ------------------------------------------------------------------------------------------------------------------ No results for input(s): CHOL, HDL, LDLCALC, TRIG, CHOLHDL, LDLDIRECT in the last 72 hours. ------------------------------------------------------------------------------------------------------------------ No results for input(s): TSH, T4TOTAL, T3FREE, THYROIDAB in the last 72 hours.  Invalid input(s): FREET3 ------------------------------------------------------------------------------------------------------------------ No results for input(s): VITAMINB12, FOLATE, FERRITIN, TIBC, IRON, RETICCTPCT in the last 72 hours.  Coagulation profile  Recent Labs Lab 03/10/17 0057  INR 1.10    No results for input(s): DDIMER in the last 72 hours.  Cardiac Enzymes No results for input(s): CKMB, TROPONINI, MYOGLOBIN in the last 168 hours.  Invalid input(s): CK ------------------------------------------------------------------------------------------------------------------ Invalid input(s): POCBNP   CBG: No results for input(s): GLUCAP in the last 168 hours.     Studies: Dg C-arm 1-60 Min  Result Date: 03/11/2017 CLINICAL DATA:  Pain.  Fell. EXAM: RIGHT FEMUR 2 VIEWS; DG C-ARM 61-120 MIN COMPARISON:  Plain films 03/10/2017. FINDINGS: Intraoperative C-arm films documenting placement of intramedullary rod with compression screw across a intertrochanteric fracture. Improved position and alignment. IMPRESSION: As above. Electronically Signed   By: Elsie Stain M.D.   On: 03/11/2017 14:06   Dg Femur, Min  2 Views Right  Result Date: 03/11/2017 CLINICAL DATA:  Pain.  Fell. EXAM: RIGHT FEMUR 2 VIEWS; DG C-ARM 61-120 MIN COMPARISON:  Plain films 03/10/2017. FINDINGS: Intraoperative C-arm films documenting placement of intramedullary rod with compression screw across a intertrochanteric fracture. Improved position and alignment. IMPRESSION: As above. Electronically Signed   By: Elsie Stain M.D.   On: 03/11/2017 14:06      No results found for: HGBA1C Lab Results  Component Value Date   CREATININE 0.62 03/12/2017       Scheduled Meds: . amLODipine  5 mg Oral Daily  . aspirin EC  81 mg Oral Daily  . atenolol  50 mg Oral Daily  . docusate sodium  100 mg Oral BID  . enoxaparin (LOVENOX) injection  30 mg Subcutaneous Q24H  . feeding supplement (ENSURE ENLIVE)  237 mL Oral BID BM  . gabapentin  100 mg Oral TID  . phenytoin  100 mg Oral QHS  . phenytoin  30 mg Oral Once per day on Mon Wed Fri  . potassium chloride  10 mEq Oral Daily  . senna  1 tablet Oral QHS   Continuous Infusions: . 0.9 % NaCl with KCl 40 mEq / L 50 mL/hr (03/12/17 0759)  . lactated ringers    . methocarbamol (ROBAXIN)  IV    . piperacillin-tazobactam (ZOSYN)  IV 3.375 g (03/12/17 0450)     LOS: 2 days    Time spent: >30 MINS    Richarda Overlie  [facility] Pager [phone]. If 7PM-7AM, please contact night-coverage at [email or web address], password [facility] 03/12/2017, 8:12 AM  LOS: 2 days

## 2017-03-12 NOTE — Anesthesia Postprocedure Evaluation (Signed)
Anesthesia Post Note  Patient: Kayla Collier  Procedure(s) Performed: Procedure(s) (LRB): RIGHT INTRAMEDULLARY (IM) NAIL INTERTROCHANTERIC (Right)  Patient location during evaluation: PACU Anesthesia Type: General Level of consciousness: awake and alert Pain management: pain level controlled Vital Signs Assessment: post-procedure vital signs reviewed and stable Respiratory status: spontaneous breathing, nonlabored ventilation, respiratory function stable and patient connected to nasal cannula oxygen Cardiovascular status: blood pressure returned to baseline and stable Postop Assessment: no signs of nausea or vomiting Anesthetic complications: no       Last Vitals:  Vitals:   03/12/17 0315 03/12/17 0558  BP: (!) 149/55 (!) 156/49  Pulse: 78 68  Resp: 17 16  Temp: 37.2 C 37 C    Last Pain:  Vitals:   03/12/17 0558  TempSrc: Oral  PainSc:                  Mickle Campton DAVID

## 2017-03-12 NOTE — Evaluation (Signed)
Occupational Therapy Evaluation Patient Details Name: Kayla Collier MRN: [id number] DOB: 11-01-1918 Today's Date: 03/12/2017    History of Present Illness 83 y.o. female with a past medical history significant for seizures and HTN and LEFT toe gangrene who presents with hip pain after a fall. Right IM nail 03/11/17. Discussions continue regarding left toe and possible amputation.PMH: HTN, seizures, Alzheimers, anxiety, RA. HOH. Palliative care following with ? 6 months life expectancy.   Clinical Impression   Patient is s/p R IM nail surgery resulting in functional limitations due to the deficits listed below (see OT problem list). Pt with L LE gangrene and very painful even with bed mobility. Pt declined surgery per notes. Recommend use of hoyer for mobility. Patient will benefit from skilled OT acutely to increase independence and safety with ADLS to allow discharge SNF.  RECOMMEND AIR MATTRESS OVERLAY due to decr bed mobility and high risk for skin break down.      Follow Up Recommendations  SNF    Equipment Recommendations  3 in 1 bedside commode;Wheelchair (measurements OT);Wheelchair cushion (measurements OT);Hospital bed    Recommendations for Other Services       Precautions / Restrictions Precautions Precautions: Fall Restrictions Weight Bearing Restrictions: Yes RLE Weight Bearing: Weight bearing as tolerated      Mobility Bed Mobility Overal bed mobility: Needs Assistance Bed Mobility: Supine to Sit     Supine to sit: Total assist;+2 for physical assistance     General bed mobility comments: needs assist for trunk mobility and scooting out towards EOB with use of pad.  Pt having incr pain with inability to help as much with pt also fearful and grasping bed rail.  Pt could not concentrate on assisting therapists with bed mobility therefore incr assist needed.   Pt needed total to mod assist to sit EOB leaning posteriorly needing cues to sit with upright posture  which pt never achieved.  It was difficult for pt to even get feet on the floor.  Pt kept asking to lie down.  Decided it best to lie pt down and nursing can lift pt with lift to get up as pt was not going to be able to weight bear.   Transfers Overall transfer level: Needs assistance               General transfer comment: unable    Balance Overall balance assessment: History of Falls;Needs assistance Sitting-balance support: Feet supported;Bilateral upper extremity supported Sitting balance-Leahy Scale: Poor Sitting balance - Comments: unable to sit without support.  See bed mobility section above                                   ADL either performed or assessed with clinical judgement   ADL Overall ADL's : Needs assistance/impaired     Grooming: Moderate assistance Grooming Details (indicate cue type and reason): pt needed cues for seqence and redirection due to distracted by dynamic environment at the time in the room. pt don dentures independently without any movement     Lower Body Bathing: Total assistance       Lower Body Dressing: Total assistance                 General ADL Comments: pt currently incontinent of bowel without awareness. pt log rolled with pillow between knees for comfort     Vision Patient Visual Report: Other (comment) Additional Comments: pt demonstrates use  of central vision only. pt states "where is my daughter?" because daughter moved to peripherial visual fields.      Perception     Praxis      Pertinent Vitals/Pain Pain Assessment: Faces Faces Pain Scale: Hurts worst Pain Location: R hip and left toe Pain Descriptors / Indicators: Grimacing;Aching;Guarding;Operative site guarding Pain Intervention(s): Limited activity within patient's tolerance;Monitored during session;Repositioned;Ice applied     Hand Dominance Right   Extremity/Trunk Assessment Upper Extremity Assessment Upper Extremity Assessment:  Overall WFL for tasks assessed   Lower Extremity Assessment Lower Extremity Assessment: Defer to PT evaluation RLE Deficits / Details: grossly 2-/5 RLE: Unable to fully assess due to pain LLE Deficits / Details: grossly 2-/5   Cervical / Trunk Assessment Cervical / Trunk Assessment: Kyphotic   Communication Communication Communication: HOH   Cognition Arousal/Alertness: Awake/alert Behavior During Therapy: WFL for tasks assessed/performed Overall Cognitive Status: History of cognitive impairments - at baseline                                     General Comments  pt noted to have wet linens and new linens applied. pt with ice applied to R hip. Daughter at bedside holding patients hand    Exercises     Shoulder Instructions      Home Living Family/patient expects to be discharged to:: Skilled nursing facility Living Arrangements: Children Available Help at Discharge: Family;Personal care attendant (lives with daughter; has aide 4hr/day) Type of Home: House Home Access: Stairs to enter [employer] of Steps: 1 step, landing and then 1 step to enter home Entrance Stairs-Rails: None Home Layout: One level               Home Equipment: Wheelchair - [employer] - 4 wheels          Prior Functioning/Environment Level of Independence: Needs assistance  Gait / Transfers Assistance Needed: had been having help with walking and wheelchair use ADL's / Homemaking Assistance Needed: A - had aide 4 hours day   Comments: Daughter states she was in Rehab when the fall happened.        OT Problem List: Decreased strength;Decreased activity tolerance;Impaired balance (sitting and/or standing);Decreased coordination;Decreased cognition;Decreased safety awareness;Decreased knowledge of use of DME or AE;Decreased knowledge of precautions;Cardiopulmonary status limiting activity;Pain      OT Treatment/Interventions: Self-care/ADL training;Therapeutic  exercise;DME and/or AE instruction;Therapeutic activities;Cognitive remediation/compensation;Patient/family education;Balance training    OT Goals(Current goals can be found in the care plan section) Acute Rehab OT Goals Patient Stated Goal: to get stronger OT Goal Formulation: With patient/family Time For Goal Achievement: 03/26/17 Potential to Achieve Goals: Good  OT Frequency: Min 2X/week   Barriers to D/C:            Co-evaluation PT/OT/SLP Co-Evaluation/Treatment: Yes Reason for Co-Treatment: Complexity of the patient's impairments (multi-system involvement);Necessary to address cognition/behavior during functional activity;For patient/therapist safety;To address functional/ADL transfers PT goals addressed during session: Mobility/safety with mobility OT goals addressed during session: ADL's and self-care;Proper use of Adaptive equipment and DME;Strengthening/ROM      AM-PAC PT "6 Clicks" Daily Activity     Outcome Measure Help from another person eating meals?: A Little Help from another person taking care of personal grooming?: A Little Help from another person toileting, which includes using toliet, bedpan, or urinal?: A Lot Help from another person bathing (including washing, rinsing, drying)?: A Lot Help from another person to put  on and taking off regular upper body clothing?: A Lot Help from another person to put on and taking off regular lower body clothing?: Total 6 Click Score: 13   End of Session Equipment Utilized During Treatment: Oxygen Nurse Communication: Mobility status;Precautions;Need for lift equipment  Activity Tolerance: Patient limited by pain Patient left: in bed;with call bell/phone within reach;with family/visitor present;with bed alarm set  OT Visit Diagnosis: Unsteadiness on feet (R26.81)                Time: 4259-5638 OT Time Calculation (min): 32 min Charges:  OT General Charges $OT Visit: 1 Procedure OT Evaluation $OT Eval High  Complexity: 1 Procedure G-Codes:      Mateo Flow   OTR/L Pager: [phone] Office: [phone] .   Boone Master B 03/12/2017, 3:18 PM

## 2017-03-13 ENCOUNTER — Inpatient Hospital Stay (HOSPITAL_COMMUNITY): Payer: Medicare Other

## 2017-03-13 LAB — CBC
HCT: 27.9 % — ABNORMAL LOW (ref 36.0–46.0)
HEMOGLOBIN: 9 g/dL — AB (ref 12.0–15.0)
MCH: 28.8 pg (ref 26.0–34.0)
MCHC: 32.3 g/dL (ref 30.0–36.0)
MCV: 89.1 fL (ref 78.0–100.0)
PLATELETS: 316 10*3/uL (ref 150–400)
RBC: 3.13 MIL/uL — ABNORMAL LOW (ref 3.87–5.11)
RDW: 15.2 % (ref 11.5–15.5)
WBC: 14.8 10*3/uL — ABNORMAL HIGH (ref 4.0–10.5)

## 2017-03-13 LAB — BASIC METABOLIC PANEL
Anion gap: 10 (ref 5–15)
BUN: 11 mg/dL (ref 6–20)
CHLORIDE: 100 mmol/L — AB (ref 101–111)
CO2: 23 mmol/L (ref 22–32)
CREATININE: 0.52 mg/dL (ref 0.44–1.00)
Calcium: 8.4 mg/dL — ABNORMAL LOW (ref 8.9–10.3)
GFR calc Af Amer: >60 mL/min (ref >60)
GFR calc non Af Amer: >60 mL/min (ref >60)
Glucose, Bld: 110 mg/dL — ABNORMAL HIGH (ref 65–99)
Potassium: 4.7 mmol/L (ref 3.5–5.1)
SODIUM: 133 mmol/L — AB (ref 135–145)

## 2017-03-13 NOTE — Progress Notes (Signed)
Pharmacy Antibiotic Note  Kayla Collier is a 83 y.o. female admitted on 03/10/2017 with L-foot cellulitis.  Pharmacy has been consulted for Zosyn dosing. Failed Augmentin pta. SCr stable at 0.52. WBC bumped today at 14.8. UOP recorded as 0.6 cc/kg/hr.   Plan: This patient's current antibiotics will be continued without adjustments.  Recommend adding stop date to therapy.   Height: 5' (152.4 cm) Weight: 100 lb (45.4 kg) IBW/kg (Calculated) : 45.5  Temp (24hrs), Avg:98 F (36.7 C), Min:97.7 F (36.5 C), Max:98.2 F (36.8 C)   Recent Labs Lab 03/10/17 0057 03/11/17 0344 03/12/17 0417 03/13/17 0522  WBC 8.9 10.5 10.8* 14.8*  CREATININE 0.52 0.55 0.62 0.52    Estimated Creatinine Clearance: 29.5 mL/min (by C-G formula based on SCr of 0.52 mg/dL).    Allergies  Allergen Reactions  . Vicodin [Hydrocodone-Acetaminophen] Other (See Comments)    hallucinations    Antimicrobials this admission: osyn 5/9 >> Ancef 5/10>>5/11 - duplicate beta-lactam postop  Dose adjustments this admission:   Microbiology results: 5/9 BCx - ngtd x2d 5/9 surgical PCR - negative  Thank you for allowing pharmacy to be a part of this patient's care.  Link Snuffer, PharmD, BCPS Clinical Pharmacist Clinical phone 03/13/2017 until 3:30 PM - [phone] After hours, please call #[phone] 03/13/2017 8:19 AM

## 2017-03-13 NOTE — Progress Notes (Signed)
[facility] PROGRESS NOTE  Kayla Collier VQQ:[id number] DOB: [birth year] DOA: 03/10/2017   PCP: Kayla Ladd, MD     Assessment/Plan: Principal Problem:   Closed comminuted intertrochanteric fracture of proximal end of right femur [facility] Kayla) Active Problems:   Gangrene of foot (HCC)   Essential hypertension   Seizures (HCC)   Alzheimer's type dementia with late onset without behavioral disturbance   Closed fracture of trochanter of right femur (HCC)   Goals of care, counseling/discussion   Palliative care encounter   83 y.o.femalewith a past medical history significant for seizures and HTN and LEFT toe gangrene who presents with hip pain after a fall.Radiograph of the RIGHT hip showed a comminuted right intertrochanteric fracture  -The case was discussed withDr. Rodolph Collier recommended transfer to Kayla Collier and evaluation for possible repair on Thursday   Assessment and Plan: 1. Hip fracture:s/p  pertrochanteric fracture with intramedullary implant 5/10, performed by Kayla Collier The patient deemed  high medical risk given her age, uncontrolled BP, frailty, gangrenous foot. Kayla Collier risk 2.9%. Telemetry stable -Hydrocodone-acetaminophen or morphine as tolerated for pain -Bed rest, apply ice, document sedation and vitals per Hip fracture protocol Transfused 1 unit packed red blood cells prior to surgery 5/10, hg 9.0 today Palliative care following secondary to multiple comorbidities     2. Gangrene of the LEFT foot: According to Kayla Collier at Kayla Collier, patient has  critical limb threatening situation.  Continue local wound care. Angiogram to be scheduled as soon as possible   Evaluated by Kayla Collier Vascular Surgery and planned for angiogram in 1 week. Patient was on Augmentin, off for 5 days. Placed back on Zosyn pending decision about angiogram of the left leg, assess possible need for amputation. Patient refuses amputation Vascular surgery consulted   Kayla Collier will  be elective procedure per Kayla Collier  hoping to speak with Vascular on Monday, palliative did  communicate this to  Kayla. Darrick Collier; she would also like Kayla. Darrick Collier to manager her mother's vascular needs after discharge (  not Kayla Collier)   3. Hypertension Hypertensive at admission. -Continue atenolol, amlodipine -Continue aspirin  4. Seizures: -Continue Dilantin and gabapentin  5. Dementia: Delirium precautions:  -Lights and TV off, minimize interruptions at night -Blinds open and lights on during day -Glasses/hearing aid with patient -Frequent reorientation -PT/OT when able -Avoid sedation medications  Palliative care consultation-doubt patient would be a candidate for intervention/surgery  6. Hypokalemia Repleted  7. Anemia Noted to have some bleeding, around the Foley, likely from trauma, UA on 03/10/17 did not show gross hematuria. Urine in the Foley bag is clear. Status post 1 unit of packed red blood cells this admission, hemoglobin stable  8.Hypoxia Currently needing 2L of oxygen  Will check chest x-ray SLP eval Regular;Thin liquid    DVT prophylaxsis  scd's, Lovenox  Code Status:  DNR    Family Communication: Discussed in detail with the patient/ daughter on multiple occasions, all imaging results, lab results explained to the patient   Disposition Plan:  Anticipated to have surgery today by orthopedics      Consultants:  Vascular  Orthopedics  Procedures:  None  Antibiotics: Anti-infectives    Start     Dose/Rate Route Frequency Ordered Stop   03/12/17 0600  ceFAZolin (ANCEF) IVPB 2g/100 mL premix  Status:  Discontinued     2 g 200 mL/hr over 30 Minutes Intravenous On call to O.R. 03/11/17 1419 03/11/17 1456   03/11/17 1900  ceFAZolin (ANCEF)  IVPB 2g/100 mL premix     2 g 200 mL/hr over 30 Minutes Intravenous Every 6 hours 03/11/17 1557 03/12/17 0750   03/11/17 1202  ceFAZolin  (ANCEF) 2-4 GM/100ML-% IVPB    Comments:  Kayla Collier   : cabinet override      03/11/17 1202 03/12/17 0014   03/10/17 1200  piperacillin-tazobactam (ZOSYN) IVPB 3.375 g     3.375 g 12.5 mL/hr over 240 Minutes Intravenous Every 8 hours 03/10/17 1115     03/10/17 1000  amoxicillin-clavulanate (AUGMENTIN) 875-125 MG per tablet 1 tablet  Status:  Discontinued     1 tablet Oral Every 12 hours 03/10/17 0859 03/10/17 1103         HPI/Subjective: Patient is much more awake and alert today, complaining of left leg pain   Objective: Vitals:   03/12/17 0558 03/12/17 1400 03/12/17 2026 03/13/17 0526  BP: (!) 156/49 (!) 121/49 (!) 142/44 (!) 181/65  Pulse: 68 73 66 78  Resp: 16 16 15 16   Temp: 98.6 F (37 C) 98.1 F (36.7 C) 97.7 F (36.5 C) 98.2 F (36.8 C)  TempSrc: Oral Oral Oral Oral  SpO2: 100% 99% 100% 100%  Weight:      Height:        Intake/Output Summary (Last 24 hours) at 03/13/17 0931 Last data filed at 03/13/17 05/13/17  Gross per 24 hour  Intake          1740.83 ml  Output              701 ml  Net          1039.83 ml    Exam:  Examination:  General exam: Appears calm and comfortable  Respiratory system: Clear to auscultation. Respiratory effort normal. Cardiovascular system: S1 & S2 heard, RRR. No JVD, murmurs, rubs, gallops or clicks. No pedal edema. Gastrointestinal system: Abdomen is nondistended, soft and nontender. No organomegaly or masses felt. Normal bowel sounds heard. Central nervous system:Awake and comfortable . No focal neurological deficits. Extremities: Symmetric 5 x 5 power. Skin: No rashes, lesions or ulcers Psychiatry: Judgement imapired     Data Reviewed: I have personally reviewed following labs and imaging studies  Micro Results Recent Results (from the past 240 hour(s))  Culture, blood (routine x 2)     Status: None (Preliminary result)   Collection Time: 03/10/17 10:21 AM  Result Value Ref Range Status   Specimen Description BLOOD  RIGHT HAND  Final   Special Requests IN PEDIATRIC BOTTLE Blood Culture adequate volume  Final   Culture NO GROWTH 2 DAYS  Final   Report Status PENDING  Incomplete  Culture, blood (routine x 2)     Status: None (Preliminary result)   Collection Time: 03/10/17 10:31 AM  Result Value Ref Range Status   Specimen Description BLOOD RIGHT ARM  Final   Special Requests IN PEDIATRIC BOTTLE Blood Culture adequate volume  Final   Culture NO GROWTH 2 DAYS  Final   Report Status PENDING  Incomplete  Surgical pcr screen     Status: None   Collection Time: 03/10/17  3:41 PM  Result Value Ref Range Status   MRSA, PCR NEGATIVE NEGATIVE Final   Staphylococcus aureus NEGATIVE NEGATIVE Final    Comment:        The Xpert SA Assay (FDA approved for NASAL specimens in patients over 42 years of age), is one component of a comprehensive surveillance program.  Test performance has been validated by [facility] for  patients greater than or equal to 3 year old. It is not intended to diagnose infection nor to guide or monitor treatment.     Radiology Reports Ct Head Wo Contrast  Result Date: 03/10/2017 CLINICAL DATA:  Fall. EXAM: CT HEAD WITHOUT CONTRAST TECHNIQUE: Contiguous axial images were obtained from the base of the skull through the vertex without intravenous contrast. COMPARISON:  05/10/2007 FINDINGS: Brain: Diffuse cerebral atrophy. Ventricular dilatation consistent with central atrophy. Low-attenuation changes in the deep white matter consistent with small vessel ischemia. No mass effect or midline shift. No abnormal extra-axial fluid collections. Gray-white matter junctions are distinct. Basal cisterns are not effaced. No acute intracranial hemorrhage. Vascular: Vascular calcifications are present. Skull: Calvarium appears intact. Sinuses/Orbits: Old deformities of the medial orbital walls bilaterally, likely representing old fracture deformities. Paranasal sinuses and mastoid air cells are clear.  Postoperative changes in the globes. Other: No significant change since prior study. IMPRESSION: No acute intracranial abnormalities. Chronic atrophy and small vessel ischemic changes. Electronically Signed   By: Burman Nieves M.D.   On: 03/10/2017 02:46   Dg Foot Complete Left  Result Date: 02/19/2017 CLINICAL DATA:  Pain in the left great toe for the last several weeks. Some drainage. EXAM: LEFT FOOT - COMPLETE 3+ VIEW COMPARISON:  None. FINDINGS: Distal all soft tissue swelling. No evidence of erosion or lytic change that would allow diagnosis of osteomyelitis by plain radiography. The remainder of the foot is negative except for some chronic arthritic change of the inter phalangeal joint of the small toe. IMPRESSION: Soft tissue swelling of the distal great toe. No underlying bone abnormality seen. Electronically Signed   By: Paulina Fusi M.D.   On: 02/19/2017 22:13   Dg C-arm 1-60 Min  Result Date: 03/11/2017 CLINICAL DATA:  Pain.  Fell. EXAM: RIGHT FEMUR 2 VIEWS; DG C-ARM 61-120 MIN COMPARISON:  Plain films 03/10/2017. FINDINGS: Intraoperative C-arm films documenting placement of intramedullary rod with compression screw across a intertrochanteric fracture. Improved position and alignment. IMPRESSION: As above. Electronically Signed   By: Elsie Stain M.D.   On: 03/11/2017 14:06   Dg Hip Unilat  With Pelvis 2-3 Views Right  Result Date: 03/10/2017 CLINICAL DATA:  Status post fall, with right hip deformity. Initial encounter. EXAM: DG HIP (WITH OR WITHOUT PELVIS) 2-3V RIGHT COMPARISON:  None. FINDINGS: There is a mildly comminuted right femoral intertrochanteric fracture, with overlying soft tissue swelling and medial angulation of the distal femur. No definite additional fractures are seen. Left femoral hardware is grossly unremarkable in appearance, without evidence of loosening. There is mild chronic deformity of the left inferior pubic ramus. The sacroiliac joints are grossly unremarkable in  appearance. The visualized bowel gas pattern is unremarkable. Scattered vascular calcifications are seen. IMPRESSION: 1. Mildly comminuted right femoral intertrochanteric fracture, with overlying soft tissue swelling and medial angulation of the distal femur. 2. Scattered vascular calcifications seen. Electronically Signed   By: Roanna Raider M.D.   On: 03/10/2017 02:43   Dg Femur, Min 2 Views Right  Result Date: 03/11/2017 CLINICAL DATA:  Pain.  Fell. EXAM: RIGHT FEMUR 2 VIEWS; DG C-ARM 61-120 MIN COMPARISON:  Plain films 03/10/2017. FINDINGS: Intraoperative C-arm films documenting placement of intramedullary rod with compression screw across a intertrochanteric fracture. Improved position and alignment. IMPRESSION: As above. Electronically Signed   By: Elsie Stain M.D.   On: 03/11/2017 14:06   Dg Femur Min 2 Views Right  Result Date: 03/10/2017 CLINICAL DATA:  Status post fall, with right hip deformity.  Initial encounter. EXAM: RIGHT FEMUR 2 VIEWS COMPARISON:  None. FINDINGS: The comminuted right femoral intertrochanteric fracture is characterized on right hip radiographs. The distal right femur appears intact. No knee joint effusion is seen. Diffuse vascular calcifications are noted. No significant degenerative change is noted at the right knee. IMPRESSION: 1. Comminuted right femoral intertrochanteric fracture. 2. Diffuse vascular calcifications seen. Electronically Signed   By: Roanna Raider M.D.   On: 03/10/2017 02:44     CBC  Recent Labs Lab 03/10/17 0057 03/11/17 0344 03/11/17 1236 03/12/17 0417 03/13/17 0522  WBC 8.9 10.5  --  10.8* 14.8*  HGB 9.6* 7.9* 8.2* 8.6* 9.0*  HCT 28.9* 24.1* 24.0* 26.0* 27.9*  PLT 386 319  --  276 316  MCV 90.0 88.6  --  88.1 89.1  MCH 29.9 29.0  --  29.2 28.8  MCHC 33.2 32.8  --  33.1 32.3  RDW 14.3 14.5  --  15.5 15.2  LYMPHSABS 1.1  --   --   --   --   MONOABS 0.9  --   --   --   --   EOSABS 0.3  --   --   --   --   BASOSABS 0.0  --   --   --    --     Chemistries   Recent Labs Lab 03/10/17 0057 03/11/17 0344 03/11/17 1236 03/12/17 0417 03/13/17 0522  NA 135 133* 136 136 133*  K 4.0 2.9* 3.2* 3.8 4.7  CL 97* 98*  --  100* 100*  CO2 29 27  --  25 23  GLUCOSE 124* 106* 104* 160* 110*  BUN 20 12  --  14 11  CREATININE 0.52 0.55  --  0.62 0.52  CALCIUM 9.1 8.2*  --  8.0* 8.4*  MG  --  1.5*  --   --   --   AST  --  27  --  32  --   ALT  --  14  --  14  --   ALKPHOS  --  86  --  75  --   BILITOT  --  0.4  --  0.4  --    ------------------------------------------------------------------------------------------------------------------ estimated creatinine clearance is 29.5 mL/min (by C-G formula based on SCr of 0.52 mg/dL). ------------------------------------------------------------------------------------------------------------------ No results for input(s): HGBA1C in the last 72 hours. ------------------------------------------------------------------------------------------------------------------ No results for input(s): CHOL, HDL, LDLCALC, TRIG, CHOLHDL, LDLDIRECT in the last 72 hours. ------------------------------------------------------------------------------------------------------------------ No results for input(s): TSH, T4TOTAL, T3FREE, THYROIDAB in the last 72 hours.  Invalid input(s): FREET3 ------------------------------------------------------------------------------------------------------------------ No results for input(s): VITAMINB12, FOLATE, FERRITIN, TIBC, IRON, RETICCTPCT in the last 72 hours.  Coagulation profile  Recent Labs Lab 03/10/17 0057  INR 1.10    No results for input(s): DDIMER in the last 72 hours.  Cardiac Enzymes No results for input(s): CKMB, TROPONINI, MYOGLOBIN in the last 168 hours.  Invalid input(s): CK ------------------------------------------------------------------------------------------------------------------ Invalid input(s): POCBNP   CBG: No results for  input(s): GLUCAP in the last 168 hours.     Studies: Dg C-arm 1-60 Min  Result Date: 03/11/2017 CLINICAL DATA:  Pain.  Fell. EXAM: RIGHT FEMUR 2 VIEWS; DG C-ARM 61-120 MIN COMPARISON:  Plain films 03/10/2017. FINDINGS: Intraoperative C-arm films documenting placement of intramedullary rod with compression screw across a intertrochanteric fracture. Improved position and alignment. IMPRESSION: As above. Electronically Signed   By: Elsie Stain M.D.   On: 03/11/2017 14:06   Dg Femur, Min 2 Views Right  Result Date: 03/11/2017 CLINICAL  DATA:  Pain.  Fell. EXAM: RIGHT FEMUR 2 VIEWS; DG C-ARM 61-120 MIN COMPARISON:  Plain films 03/10/2017. FINDINGS: Intraoperative C-arm films documenting placement of intramedullary rod with compression screw across a intertrochanteric fracture. Improved position and alignment. IMPRESSION: As above. Electronically Signed   By: Elsie Stain M.D.   On: 03/11/2017 14:06      No results found for: HGBA1C Lab Results  Component Value Date   CREATININE 0.52 03/13/2017       Scheduled Meds: . amLODipine  5 mg Oral Daily  . aspirin EC  81 mg Oral Daily  . atenolol  50 mg Oral Daily  . docusate sodium  100 mg Oral BID  . enoxaparin (LOVENOX) injection  30 mg Subcutaneous Q24H  . feeding supplement (ENSURE ENLIVE)  237 mL Oral BID BM  . gabapentin  100 mg Oral TID  . phenytoin  100 mg Oral QHS  . phenytoin  30 mg Oral Once per day on Mon Wed Fri  . potassium chloride  10 mEq Oral Daily  . senna  1 tablet Oral QHS   Continuous Infusions: . 0.9 % NaCl with KCl 40 mEq / L 50 mL/hr (03/13/17 0727)  . lactated ringers    . methocarbamol (ROBAXIN)  IV    . piperacillin-tazobactam (ZOSYN)  IV 3.375 g (03/13/17 0519)     LOS: 3 days    Time spent: >30 MINS    Richarda Overlie  [facility] Pager [phone]. If 7PM-7AM, please contact night-coverage at [email or web address], password [facility] 03/13/2017, 9:31 AM  LOS: 3 days

## 2017-03-13 NOTE — Progress Notes (Signed)
SLP Cancellation Note  Patient Details Name: Kayla Collier MRN: [id number] DOB: 1918/04/28   Cancelled treatment:       Reason Eval/Treat Not Completed: Patient at procedure or test/unavailable. SLP will f/u next date.  Rondel Baton, [place], CCC-SLP Speech-Language Pathologist [phone]   [name] 03/13/2017, 5:04 PM

## 2017-03-14 LAB — BPAM RBC
BLOOD PRODUCT EXPIRATION DATE: 201805172359
BLOOD PRODUCT EXPIRATION DATE: 201805312359
BLOOD PRODUCT EXPIRATION DATE: 201805312359
ISSUE DATE / TIME: 201805101202
UNIT TYPE AND RH: 5100
Unit Type and Rh: 5100
Unit Type and Rh: 5100

## 2017-03-14 LAB — TYPE AND SCREEN
ABO/RH(D): O POS
ANTIBODY SCREEN: NEGATIVE
UNIT DIVISION: 0
UNIT DIVISION: 0
Unit division: 0

## 2017-03-14 LAB — CBC
HCT: 25.6 % — ABNORMAL LOW (ref 36.0–46.0)
Hemoglobin: 8.8 g/dL — ABNORMAL LOW (ref 12.0–15.0)
MCH: 30.6 pg (ref 26.0–34.0)
MCHC: 34.4 g/dL (ref 30.0–36.0)
MCV: 88.9 fL (ref 78.0–100.0)
PLATELETS: 322 10*3/uL (ref 150–400)
RBC: 2.88 MIL/uL — ABNORMAL LOW (ref 3.87–5.11)
RDW: 15.1 % (ref 11.5–15.5)
WBC: 12.4 10*3/uL — ABNORMAL HIGH (ref 4.0–10.5)

## 2017-03-14 MED ORDER — MORPHINE SULFATE (PF) 4 MG/ML IV SOLN
0.5000 mg | INTRAVENOUS | Status: DC | PRN
  Administered 2017-03-15: 1 mg via INTRAVENOUS
  Filled 2017-03-14: qty 1

## 2017-03-14 MED ORDER — HYDRALAZINE HCL 20 MG/ML IJ SOLN: 5.0000 mg | Freq: Four times a day (QID) | INTRAMUSCULAR | Status: DC | PRN

## 2017-03-14 NOTE — NC FL2 (Signed)
[place] MEDICAID FL2 LEVEL OF CARE SCREENING TOOL     IDENTIFICATION  Patient Name: Kayla Collier Birthdate: September 14, 1918 Sex: female Admission Date (Current Location): 03/10/2017  [facility] [place] and [place] Number:  Producer, television/film/video and Address:  [facility] [place], [address], [place], [place] [address]      Provider Number: [id number]  Attending Physician Name and Address:  Richarda Overlie, MD  Relative Name and Phone Number:       Current Level of Care: Hospital Recommended Level of Care: Skilled Nursing Facility Prior Approval Number:    Date Approved/Denied:   PASRR Number: [id number]  Discharge Plan: SNF    Current Diagnoses: Patient Active Problem List   Diagnosis Date Noted  . Closed comminuted intertrochanteric fracture of proximal end of right femur (HCC) 03/10/2017  . Alzheimer's type dementia with late onset without behavioral disturbance 03/10/2017  . Closed fracture of trochanter of right femur (HCC)   . Goals of care, counseling/discussion   . Palliative care encounter   . 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 Gangrene of foot (HCC) 02/19/2017  . Essential hypertension 02/19/2017  . Anxiety 02/19/2017  . Seizures (HCC) 02/19/2017    Orientation RESPIRATION BLADDER Height & Weight     Self, Time, Situation, Place  O2 (2L [place]) Incontinent, Indwelling catheter Weight: 100 lb (45.4 kg) Height:  5' (152.4 cm)  BEHAVIORAL SYMPTOMS/MOOD NEUROLOGICAL BOWEL NUTRITION STATUS      Incontinent Diet (regular)  AMBULATORY STATUS COMMUNICATION OF NEEDS Skin   Extensive Assist Verbally Normal                       Personal Care Assistance Level of Assistance  Bathing, Dressing Bathing Assistance: Maximum assistance Feeding assistance: Limited assistance Dressing Assistance: Maximum assistance     Functional Limitations Info             SPECIAL CARE FACTORS FREQUENCY  PT (By licensed PT), OT  (By licensed OT)     PT Frequency: 5/wk OT Frequency: 5/wk            Contractures      Additional Factors Info  Code Status, Allergies Code Status Info: DNR Allergies Info: Vicodin Hydrocodone-acetaminophen           Current Medications (03/14/2017):  This is the current hospital active medication list Current Facility-Administered Medications  Medication Dose Route Frequency Provider Last Rate Last Dose  . 0.9 % NaCl with KCl 40 mEq / L  infusion   Intravenous Continuous Richarda Overlie, MD 50 mL/hr at 03/14/17 0200 50 mL/hr at 03/14/17 0200  . acetaminophen (TYLENOL) tablet 650 mg  650 mg Oral Q6H PRN Tarry Kos, MD   650 mg at 03/13/17 1459   Or  . acetaminophen (TYLENOL) suppository 650 mg  650 mg Rectal Q6H PRN Tarry Kos, MD      . alum & mag hydroxide-simeth (MAALOX/MYLANTA) 200-200-20 MG/5ML suspension 30 mL  30 mL Oral Q4H PRN Tarry Kos, MD      . amLODipine (NORVASC) tablet 5 mg  5 mg Oral Daily Danford, Earl Lites, MD   5 mg at 03/14/17 1111  . aspirin EC tablet 81 mg  81 mg Oral Daily Alberteen Sam, MD   81 mg at 03/14/17 1111  . atenolol (TENORMIN) tablet 50 mg  50 mg Oral Daily Alberteen Sam, MD   50 mg at 03/14/17 1111  . bisacodyl (  DULCOLAX) suppository 10 mg  10 mg Rectal Daily PRN Danford, Earl Lites, MD      . docusate sodium (COLACE) capsule 100 mg  100 mg Oral BID Alberteen Sam, MD   100 mg at 03/13/17 2240  . enoxaparin (LOVENOX) injection 30 mg  30 mg Subcutaneous Q24H Richarda Overlie, MD   30 mg at 03/14/17 0807  . feeding supplement (ENSURE ENLIVE) (ENSURE ENLIVE) liquid 237 mL  237 mL Oral BID BM Richarda Overlie, MD   237 mL at 03/13/17 2016  . gabapentin (NEURONTIN) capsule 100 mg  100 mg Oral TID Alberteen Sam, MD   100 mg at 03/14/17 1112  . hydrALAZINE (APRESOLINE) injection 5 mg  5 mg Intravenous Q6H PRN Richarda Overlie, MD      . HYDROcodone-acetaminophen (NORCO/VICODIN) 5-325 MG per tablet 1-2 tablet   1-2 tablet Oral Q6H PRN Tarry Kos, MD   1 tablet at 03/14/17 0645  . lactated ringers infusion   Intravenous Continuous Lowella Curb, MD      . menthol-cetylpyridinium (CEPACOL) lozenge 3 mg  1 lozenge Oral PRN Tarry Kos, MD       Or  . phenol (CHLORASEPTIC) mouth spray 1 spray  1 spray Mouth/Throat PRN Tarry Kos, MD      . methocarbamol (ROBAXIN) tablet 500 mg  500 mg Oral Q6H PRN Tarry Kos, MD   500 mg at 03/14/17 0645   Or  . methocarbamol (ROBAXIN) 500 mg in dextrose 5 % 50 mL IVPB  500 mg Intravenous Q6H PRN Tarry Kos, MD      . metoCLOPramide (REGLAN) tablet 5-10 mg  5-10 mg Oral Q8H PRN Tarry Kos, MD       Or  . metoCLOPramide (REGLAN) injection 5-10 mg  5-10 mg Intravenous Q8H PRN Tarry Kos, MD      . morphine 4 MG/ML injection 0.52 mg  0.52 mg Intravenous Q2H PRN Tarry Kos, MD   0.52 mg at 03/14/17 1222  . ondansetron (ZOFRAN) tablet 4 mg  4 mg Oral Q6H PRN Tarry Kos, MD       Or  . ondansetron [facility]) injection 4 mg  4 mg Intravenous Q6H PRN Tarry Kos, MD      . oxyCODONE (Oxy IR/ROXICODONE) immediate release tablet 5-10 mg  5-10 mg Oral Q4H PRN Tarry Kos, MD   5 mg at 03/13/17 1815  . phenytoin (DILANTIN) ER capsule 100 mg  100 mg Oral QHS Danford, Earl Lites, MD   100 mg at 03/13/17 2240  . phenytoin (DILANTIN) ER capsule 30 mg  30 mg Oral Once per day on Mon Wed Fri Richarda Overlie, MD   30 mg at 03/12/17 2323  . piperacillin-tazobactam (ZOSYN) IVPB 3.375 g  3.375 g Intravenous Q8H Dang, Thuy D, RPH 12.5 mL/hr at 03/14/17 1222 3.375 g at 03/14/17 1222  . polyethylene glycol (MIRALAX / GLYCOLAX) packet 17 g  17 g Oral Daily PRN Danford, Earl Lites, MD      . potassium chloride (K-DUR,KLOR-CON) CR tablet 10 mEq  10 mEq Oral Daily Danford, Earl Lites, MD   10 mEq at 03/14/17 1112  . senna (SENOKOT) tablet 8.6 mg  1 tablet Oral QHS Ulice Bold, NP   8.6 mg at 03/13/17 2240     Discharge Medications: Please see discharge  summary for a list of discharge medications.  Relevant Imaging Results:  Relevant Lab Results:   Additional Information SS#  [id number]  Burna Sis, LCSW

## 2017-03-14 NOTE — Progress Notes (Signed)
[facility] PROGRESS NOTE  Kayla Collier ZOX:[id number] DOB: 08-Mar-1918 DOA: 03/10/2017   PCP: Ileana Ladd, MD     Assessment/Plan: Principal Problem:   Closed comminuted intertrochanteric fracture of proximal end of right femur [facility]) Active Problems:   Gangrene of foot (HCC)   Essential hypertension   Seizures (HCC)   Alzheimer's type dementia with late onset without behavioral disturbance   Closed fracture of trochanter of right femur (HCC)   Goals of care, counseling/discussion   Palliative care encounter   83 y.o.femalewith a past medical history significant for seizures and HTN and LEFT toe gangrene who presents with hip pain after a fall.Radiograph of the RIGHT hip showed a comminuted right intertrochanteric fracture  -The case was discussed withDr. Rodolph Bong recommended transfer to [facility] and evaluation for possible repair on Thursday   Assessment and Plan: 1. Hip fracture:s/p  pertrochanteric fracture with intramedullary implant 5/10, performed by Dr. Roda Shutters The patient deemed  high medical risk given her age, uncontrolled BP, frailty, gangrenous foot. Gupta risk 2.9%. Telemetry stable -Hydrocodone-acetaminophen or morphine as tolerated for pain -Bed rest, apply ice, document sedation and vitals per Hip fracture protocol Transfused 1 unit packed red blood cells prior to surgery 5/10, hemoglobin. Has been stable Palliative care following secondary to multiple comorbidities Plan is to discharge to SNF Follow Lovenox for DVT prophylaxis    2. Gangrene of the LEFT foot: According to Dr. Barbara Cower dew at [facility], patient has  critical limb threatening situation.  Continue local wound care. Angiogram to be scheduled as soon as possible   Evaluated by [facility] and planned for angiogram in 1 week. Patient was on Augmentin, off for 5 days. Placed back on Zosyn pending decision about angiogram of the left leg, assess possible need for amputation.  Patient refuses amputation, but daughter interested in revascularization Vascular surgery consulted   Dr. Cari Caraway will be elective procedure per Dr Darrick Penna  Daughter hoping to speak with Vascular on Monday, palliative did  communicate this to  Dr. Darrick Penna; she would also like Dr. Darrick Penna to manager her mother's vascular needs after discharge (  not Dr. Wyn Quaker)   3. Hypertension Hypertensive at admission. -Continue atenolol, amlodipine -Continue aspirin Added prn hydralazine  4. Seizures: -Continue Dilantin and gabapentin  5. Dementia: Delirium precautions:  -Lights and TV off, minimize interruptions at night -Blinds open and lights on during day -Glasses/hearing aid with patient -Frequent reorientation -PT/OT when able -Avoid sedation medications  Palliative care consultation-doubt patient would be a candidate for intervention/surgery  6. Hypokalemia Repleted  7. Anemia Noted to have some bleeding, around the Foley, likely from trauma, UA on 03/10/17 did not show gross hematuria. Urine in the Foley bag is clear. Status post 1 unit of packed red blood cells this admission, hemoglobin stable  8.Hypoxia Currently needing 2L of oxygen  Will check chest x-ray SLP eval Regular;Thin liquid    DVT prophylaxsis  scd's, Lovenox  Code Status:  DNR    Family Communication: Discussed in detail with the patient/ daughter on multiple occasions, all imaging results, lab results explained to the patient   Disposition Plan:  Anticipate discharge on Monday after discussion with vascular surgery      Consultants:  Vascular  Orthopedics  Procedures:  None  Antibiotics: Anti-infectives    Start     Dose/Rate Route Frequency Ordered Stop   03/12/17 0600  ceFAZolin (ANCEF) IVPB 2g/100 mL premix  Status:  Discontinued     2  g 200 mL/hr over 30 Minutes Intravenous On call to O.R. 03/11/17 1419 03/11/17  1456   03/11/17 1900  ceFAZolin (ANCEF) IVPB 2g/100 mL premix     2 g 200 mL/hr over 30 Minutes Intravenous Every 6 hours 03/11/17 1557 03/12/17 0750   03/11/17 1202  ceFAZolin (ANCEF) 2-4 GM/100ML-% IVPB    Comments:  Forte, Lindsi   : cabinet override      03/11/17 1202 03/12/17 0014   03/10/17 1200  piperacillin-tazobactam (ZOSYN) IVPB 3.375 g     3.375 g 12.5 mL/hr over 240 Minutes Intravenous Every 8 hours 03/10/17 1115     03/10/17 1000  amoxicillin-clavulanate (AUGMENTIN) 875-125 MG per tablet 1 tablet  Status:  Discontinued     1 tablet Oral Every 12 hours 03/10/17 0859 03/10/17 1103         HPI/Subjective: Patient is much more awake and alert today, complaining of left leg pain   Objective: Vitals:   03/13/17 0526 03/13/17 1338 03/13/17 2021 03/14/17 0531  BP: (!) 181/65 (!) 163/46 (!) 152/60 (!) 160/51  Pulse: 78 74 72 71  Resp: 16 16 19 19   Temp: 98.2 F (36.8 C) 98 F (36.7 C) 98 F (36.7 C) 98.1 F (36.7 C)  TempSrc: Oral Oral Oral Oral  SpO2: 100% 100% 100% 100%  Weight:      Height:        Intake/Output Summary (Last 24 hours) at 03/14/17 1023 Last data filed at 03/14/17 0600  Gross per 24 hour  Intake           2907.5 ml  Output             1101 ml  Net           1806.5 ml    Exam:  Examination:  General exam: Appears calm and comfortable  Respiratory system: Clear to auscultation. Respiratory effort normal. Cardiovascular system: S1 & S2 heard, RRR. No JVD, murmurs, rubs, gallops or clicks. No pedal edema. Gastrointestinal system: Abdomen is nondistended, soft and nontender. No organomegaly or masses felt. Normal bowel sounds heard. Central nervous system:Awake and comfortable . No focal neurological deficits. Extremities: Symmetric 5 x 5 power. Skin: No rashes, lesions or ulcers Psychiatry: Judgement imapired     Data Reviewed: I have personally reviewed following labs and imaging studies  Micro Results Recent Results (from the past  240 hour(s))  Culture, blood (routine x 2)     Status: None (Preliminary result)   Collection Time: 03/10/17 10:21 AM  Result Value Ref Range Status   Specimen Description BLOOD RIGHT HAND  Final   Special Requests IN PEDIATRIC BOTTLE Blood Culture adequate volume  Final   Culture NO GROWTH 4 DAYS  Final   Report Status PENDING  Incomplete  Culture, blood (routine x 2)     Status: None (Preliminary result)   Collection Time: 03/10/17 10:31 AM  Result Value Ref Range Status   Specimen Description BLOOD RIGHT ARM  Final   Special Requests IN PEDIATRIC BOTTLE Blood Culture adequate volume  Final   Culture NO GROWTH 4 DAYS  Final   Report Status PENDING  Incomplete  Surgical pcr screen     Status: None   Collection Time: 03/10/17  3:41 PM  Result Value Ref Range Status   MRSA, PCR NEGATIVE NEGATIVE Final   Staphylococcus aureus NEGATIVE NEGATIVE Final    Comment:        The Xpert SA Assay (FDA approved for NASAL specimens in patients  over 61 years of age), is one component of a comprehensive surveillance program.  Test performance has been validated by [facility] for patients greater than or equal to 15 year old. It is not intended to diagnose infection nor to guide or monitor treatment.     Radiology Reports Ct Head Wo Contrast  Result Date: 03/10/2017 CLINICAL DATA:  Fall. EXAM: CT HEAD WITHOUT CONTRAST TECHNIQUE: Contiguous axial images were obtained from the base of the skull through the vertex without intravenous contrast. COMPARISON:  05/10/2007 FINDINGS: Brain: Diffuse cerebral atrophy. Ventricular dilatation consistent with central atrophy. Low-attenuation changes in the deep white matter consistent with small vessel ischemia. No mass effect or midline shift. No abnormal extra-axial fluid collections. Gray-white matter junctions are distinct. Basal cisterns are not effaced. No acute intracranial hemorrhage. Vascular: Vascular calcifications are present. Skull: Calvarium  appears intact. Sinuses/Orbits: Old deformities of the medial orbital walls bilaterally, likely representing old fracture deformities. Paranasal sinuses and mastoid air cells are clear. Postoperative changes in the globes. Other: No significant change since prior study. IMPRESSION: No acute intracranial abnormalities. Chronic atrophy and small vessel ischemic changes. Electronically Signed   By: Burman Nieves M.D.   On: 03/10/2017 02:46   Dg Chest Port 1 View  Result Date: 03/13/2017 CLINICAL DATA:  83 year old female with hypoxia EXAM: PORTABLE CHEST 1 VIEW COMPARISON:  Prior chest x-ray 11/06/2015 FINDINGS: Left basilar opacity obscures the diaphragm consistent with left lower lobe location. Stable mild cardiomegaly. Atherosclerotic calcifications again noted in the transverse aorta. No pulmonary edema. Chronic bronchitic changes and interstitial prominence are similar compared to prior. No acute osseous abnormality. IMPRESSION: 1. Patchy airspace opacity in the left lower lobe obscures the hemidiaphragm. Differential considerations include left lower lobe pneumonia, left lower lobe atelectasis, and possibly left pleural effusion with associated left lower lobe atelectasis. Consider dedicated PA and lateral chest x-ray. 2. Stable cardiomegaly. 3.  Aortic Atherosclerosis (ICD10-170.0). Electronically Signed   By: Malachy Moan M.D.   On: 03/13/2017 10:06   Dg Foot Complete Left  Result Date: 02/19/2017 CLINICAL DATA:  Pain in the left great toe for the last several weeks. Some drainage. EXAM: LEFT FOOT - COMPLETE 3+ VIEW COMPARISON:  None. FINDINGS: Distal all soft tissue swelling. No evidence of erosion or lytic change that would allow diagnosis of osteomyelitis by plain radiography. The remainder of the foot is negative except for some chronic arthritic change of the inter phalangeal joint of the small toe. IMPRESSION: Soft tissue swelling of the distal great toe. No underlying bone abnormality  seen. Electronically Signed   By: Paulina Fusi M.D.   On: 02/19/2017 22:13   Dg C-arm 1-60 Min  Result Date: 03/11/2017 CLINICAL DATA:  Pain.  Fell. EXAM: RIGHT FEMUR 2 VIEWS; DG C-ARM 61-120 MIN COMPARISON:  Plain films 03/10/2017. FINDINGS: Intraoperative C-arm films documenting placement of intramedullary rod with compression screw across a intertrochanteric fracture. Improved position and alignment. IMPRESSION: As above. Electronically Signed   By: Elsie Stain M.D.   On: 03/11/2017 14:06   Dg Hip Unilat  With Pelvis 2-3 Views Right  Result Date: 03/10/2017 CLINICAL DATA:  Status post fall, with right hip deformity. Initial encounter. EXAM: DG HIP (WITH OR WITHOUT PELVIS) 2-3V RIGHT COMPARISON:  None. FINDINGS: There is a mildly comminuted right femoral intertrochanteric fracture, with overlying soft tissue swelling and medial angulation of the distal femur. No definite additional fractures are seen. Left femoral hardware is grossly unremarkable in appearance, without evidence of loosening. There is mild  chronic deformity of the left inferior pubic ramus. The sacroiliac joints are grossly unremarkable in appearance. The visualized bowel gas pattern is unremarkable. Scattered vascular calcifications are seen. IMPRESSION: 1. Mildly comminuted right femoral intertrochanteric fracture, with overlying soft tissue swelling and medial angulation of the distal femur. 2. Scattered vascular calcifications seen. Electronically Signed   By: Roanna Raider M.D.   On: 03/10/2017 02:43   Dg Femur, Min 2 Views Right  Result Date: 03/11/2017 CLINICAL DATA:  Pain.  Fell. EXAM: RIGHT FEMUR 2 VIEWS; DG C-ARM 61-120 MIN COMPARISON:  Plain films 03/10/2017. FINDINGS: Intraoperative C-arm films documenting placement of intramedullary rod with compression screw across a intertrochanteric fracture. Improved position and alignment. IMPRESSION: As above. Electronically Signed   By: Elsie Stain M.D.   On: 03/11/2017 14:06    Dg Femur Min 2 Views Right  Result Date: 03/10/2017 CLINICAL DATA:  Status post fall, with right hip deformity. Initial encounter. EXAM: RIGHT FEMUR 2 VIEWS COMPARISON:  None. FINDINGS: The comminuted right femoral intertrochanteric fracture is characterized on right hip radiographs. The distal right femur appears intact. No knee joint effusion is seen. Diffuse vascular calcifications are noted. No significant degenerative change is noted at the right knee. IMPRESSION: 1. Comminuted right femoral intertrochanteric fracture. 2. Diffuse vascular calcifications seen. Electronically Signed   By: Roanna Raider M.D.   On: 03/10/2017 02:44     CBC  Recent Labs Lab 03/10/17 0057 03/11/17 0344 03/11/17 1236 03/12/17 0417 03/13/17 0522 03/14/17 0333  WBC 8.9 10.5  --  10.8* 14.8* 12.4*  HGB 9.6* 7.9* 8.2* 8.6* 9.0* 8.8*  HCT 28.9* 24.1* 24.0* 26.0* 27.9* 25.6*  PLT 386 319  --  276 316 322  MCV 90.0 88.6  --  88.1 89.1 88.9  MCH 29.9 29.0  --  29.2 28.8 30.6  MCHC 33.2 32.8  --  33.1 32.3 34.4  RDW 14.3 14.5  --  15.5 15.2 15.1  LYMPHSABS 1.1  --   --   --   --   --   MONOABS 0.9  --   --   --   --   --   EOSABS 0.3  --   --   --   --   --   BASOSABS 0.0  --   --   --   --   --     Chemistries   Recent Labs Lab 03/10/17 0057 03/11/17 0344 03/11/17 1236 03/12/17 0417 03/13/17 0522  NA 135 133* 136 136 133*  K 4.0 2.9* 3.2* 3.8 4.7  CL 97* 98*  --  100* 100*  CO2 29 27  --  25 23  GLUCOSE 124* 106* 104* 160* 110*  BUN 20 12  --  14 11  CREATININE 0.52 0.55  --  0.62 0.52  CALCIUM 9.1 8.2*  --  8.0* 8.4*  MG  --  1.5*  --   --   --   AST  --  27  --  32  --   ALT  --  14  --  14  --   ALKPHOS  --  86  --  75  --   BILITOT  --  0.4  --  0.4  --    ------------------------------------------------------------------------------------------------------------------ estimated creatinine clearance is 29.5 mL/min (by C-G formula based on SCr of 0.52  mg/dL). ------------------------------------------------------------------------------------------------------------------ No results for input(s): HGBA1C in the last 72 hours. ------------------------------------------------------------------------------------------------------------------ No results for input(s): CHOL, HDL, LDLCALC, TRIG, CHOLHDL, LDLDIRECT in the last  72 hours. ------------------------------------------------------------------------------------------------------------------ No results for input(s): TSH, T4TOTAL, T3FREE, THYROIDAB in the last 72 hours.  Invalid input(s): FREET3 ------------------------------------------------------------------------------------------------------------------ No results for input(s): VITAMINB12, FOLATE, FERRITIN, TIBC, IRON, RETICCTPCT in the last 72 hours.  Coagulation profile  Recent Labs Lab 03/10/17 0057  INR 1.10    No results for input(s): DDIMER in the last 72 hours.  Cardiac Enzymes No results for input(s): CKMB, TROPONINI, MYOGLOBIN in the last 168 hours.  Invalid input(s): CK ------------------------------------------------------------------------------------------------------------------ Invalid input(s): POCBNP   CBG: No results for input(s): GLUCAP in the last 168 hours.     Studies: Dg Chest Port 1 View  Result Date: 03/13/2017 CLINICAL DATA:  83 year old female with hypoxia EXAM: PORTABLE CHEST 1 VIEW COMPARISON:  Prior chest x-ray 11/06/2015 FINDINGS: Left basilar opacity obscures the diaphragm consistent with left lower lobe location. Stable mild cardiomegaly. Atherosclerotic calcifications again noted in the transverse aorta. No pulmonary edema. Chronic bronchitic changes and interstitial prominence are similar compared to prior. No acute osseous abnormality. IMPRESSION: 1. Patchy airspace opacity in the left lower lobe obscures the hemidiaphragm. Differential considerations include left lower lobe pneumonia,  left lower lobe atelectasis, and possibly left pleural effusion with associated left lower lobe atelectasis. Consider dedicated PA and lateral chest x-ray. 2. Stable cardiomegaly. 3.  Aortic Atherosclerosis (ICD10-170.0). Electronically Signed   By: Malachy Moan M.D.   On: 03/13/2017 10:06      No results found for: HGBA1C Lab Results  Component Value Date   CREATININE 0.52 03/13/2017       Scheduled Meds: . amLODipine  5 mg Oral Daily  . aspirin EC  81 mg Oral Daily  . atenolol  50 mg Oral Daily  . docusate sodium  100 mg Oral BID  . enoxaparin (LOVENOX) injection  30 mg Subcutaneous Q24H  . feeding supplement (ENSURE ENLIVE)  237 mL Oral BID BM  . gabapentin  100 mg Oral TID  . phenytoin  100 mg Oral QHS  . phenytoin  30 mg Oral Once per day on Mon Wed Fri  . potassium chloride  10 mEq Oral Daily  . senna  1 tablet Oral QHS   Continuous Infusions: . 0.9 % NaCl with KCl 40 mEq / L 50 mL/hr (03/14/17 0200)  . lactated ringers    . methocarbamol (ROBAXIN)  IV    . piperacillin-tazobactam (ZOSYN)  IV Stopped (03/14/17 0925)     LOS: 4 days    Time spent: >30 MINS    Richarda Overlie  [facility] Pager [phone]. If 7PM-7AM, please contact night-coverage at [email or web address], password [facility] 03/14/2017, 10:23 AM  LOS: 4 days

## 2017-03-14 NOTE — Progress Notes (Signed)
  Speech Language Pathology Treatment: Dysphagia  Patient Details Name: Kayla Collier MRN: [id number] DOB: 22-Mar-1918 Today's Date: 03/14/2017 Time: 1005-1020 SLP Time Calculation (min) (ACUTE ONLY): 15 min  Assessment / Plan / Recommendation Clinical Impression  F/u after initial swallow assessment pre-surgery.  Pt is swallowing remarkably well given advanced age, dementia, recent hip surgery.  She demonstrates prolonged but functional mastication, adequate attention, brisk swallow response, and no s/s of aspiration, even when taxing the swallow with multiple boluses in quick succession.  Recommend continuing regular diet as ordered, thin liquids.  No further SLP f/u is warranted - our services will sign off.    HPI HPI: Ptis a 83 y.o. female admitted s/p fall with R hip fracture. PMH includes RA, HTN, dementia, left tow gangrene, seizures      SLP Plan  All goals met       Recommendations  Diet recommendations: Regular;Thin liquid Medication Administration: Whole meds with liquid                Oral Care Recommendations: Oral care BID SLP Visit Diagnosis: Dysphagia, unspecified (R13.10) Plan: All goals met       GO                Juan Quam Laurice 03/14/2017, 10:24 AM

## 2017-03-14 NOTE — Progress Notes (Signed)
Spoke with pt dtr, Misty Stanley, regarding SNF placement.  Dtr in agreement to SNF placement with palliative follow up- not interested in pt returning to [facility] due to patient recent fall there.  Referral sent to [facility] SNFs- 1st choice is [facility] 2nd choice is [facility]- awaiting facility responses to referrals  Pt dtr has also applied for Medicaid in anticipation of possible long term care needs for pt.  CSW will continue to follow  Burna Sis, LCSW Clinical Social Worker [phone]

## 2017-03-15 ENCOUNTER — Ambulatory Visit: Admit: 2017-03-15 | Payer: Medicare Other | Admitting: Vascular Surgery

## 2017-03-15 ENCOUNTER — Telehealth: Payer: Self-pay | Admitting: Vascular Surgery

## 2017-03-15 DIAGNOSIS — B373 Candidiasis of vulva and vagina: Secondary | ICD-10-CM | POA: Diagnosis not present

## 2017-03-15 DIAGNOSIS — S72141D Displaced intertrochanteric fracture of right femur, subsequent encounter for closed fracture with routine healing: Secondary | ICD-10-CM | POA: Diagnosis not present

## 2017-03-15 DIAGNOSIS — D649 Anemia, unspecified: Secondary | ICD-10-CM | POA: Diagnosis present

## 2017-03-15 DIAGNOSIS — E876 Hypokalemia: Secondary | ICD-10-CM | POA: Diagnosis present

## 2017-03-15 DIAGNOSIS — S72101A Unspecified trochanteric fracture of right femur, initial encounter for closed fracture: Secondary | ICD-10-CM | POA: Diagnosis not present

## 2017-03-15 DIAGNOSIS — E440 Moderate protein-calorie malnutrition: Secondary | ICD-10-CM | POA: Diagnosis not present

## 2017-03-15 DIAGNOSIS — E871 Hypo-osmolality and hyponatremia: Secondary | ICD-10-CM | POA: Diagnosis not present

## 2017-03-15 DIAGNOSIS — J9601 Acute respiratory failure with hypoxia: Secondary | ICD-10-CM | POA: Diagnosis not present

## 2017-03-15 DIAGNOSIS — R402441 Other coma, without documented Glasgow coma scale score, or with partial score reported, in the field [EMT or ambulance]: Secondary | ICD-10-CM | POA: Diagnosis not present

## 2017-03-15 DIAGNOSIS — D62 Acute posthemorrhagic anemia: Secondary | ICD-10-CM | POA: Diagnosis not present

## 2017-03-15 DIAGNOSIS — I70262 Atherosclerosis of native arteries of extremities with gangrene, left leg: Secondary | ICD-10-CM | POA: Diagnosis not present

## 2017-03-15 DIAGNOSIS — I739 Peripheral vascular disease, unspecified: Secondary | ICD-10-CM | POA: Diagnosis not present

## 2017-03-15 DIAGNOSIS — I517 Cardiomegaly: Secondary | ICD-10-CM | POA: Diagnosis not present

## 2017-03-15 DIAGNOSIS — R300 Dysuria: Secondary | ICD-10-CM | POA: Diagnosis not present

## 2017-03-15 DIAGNOSIS — M6281 Muscle weakness (generalized): Secondary | ICD-10-CM | POA: Diagnosis not present

## 2017-03-15 DIAGNOSIS — R569 Unspecified convulsions: Secondary | ICD-10-CM | POA: Diagnosis not present

## 2017-03-15 DIAGNOSIS — N390 Urinary tract infection, site not specified: Secondary | ICD-10-CM | POA: Diagnosis not present

## 2017-03-15 DIAGNOSIS — L8990 Pressure ulcer of unspecified site, unspecified stage: Secondary | ICD-10-CM | POA: Diagnosis present

## 2017-03-15 DIAGNOSIS — M25551 Pain in right hip: Secondary | ICD-10-CM | POA: Diagnosis not present

## 2017-03-15 DIAGNOSIS — Z515 Encounter for palliative care: Secondary | ICD-10-CM | POA: Diagnosis not present

## 2017-03-15 DIAGNOSIS — Z79899 Other long term (current) drug therapy: Secondary | ICD-10-CM | POA: Diagnosis not present

## 2017-03-15 DIAGNOSIS — S79919A Unspecified injury of unspecified hip, initial encounter: Secondary | ICD-10-CM | POA: Diagnosis not present

## 2017-03-15 DIAGNOSIS — I10 Essential (primary) hypertension: Secondary | ICD-10-CM | POA: Diagnosis not present

## 2017-03-15 DIAGNOSIS — R4020 Unspecified coma: Secondary | ICD-10-CM | POA: Diagnosis not present

## 2017-03-15 DIAGNOSIS — R4182 Altered mental status, unspecified: Secondary | ICD-10-CM | POA: Diagnosis not present

## 2017-03-15 DIAGNOSIS — D509 Iron deficiency anemia, unspecified: Secondary | ICD-10-CM | POA: Diagnosis not present

## 2017-03-15 DIAGNOSIS — Z4789 Encounter for other orthopedic aftercare: Secondary | ICD-10-CM | POA: Diagnosis not present

## 2017-03-15 DIAGNOSIS — F0150 Vascular dementia without behavioral disturbance: Secondary | ICD-10-CM | POA: Diagnosis present

## 2017-03-15 DIAGNOSIS — E8809 Other disorders of plasma-protein metabolism, not elsewhere classified: Secondary | ICD-10-CM | POA: Diagnosis not present

## 2017-03-15 DIAGNOSIS — R2689 Other abnormalities of gait and mobility: Secondary | ICD-10-CM | POA: Diagnosis not present

## 2017-03-15 DIAGNOSIS — K5900 Constipation, unspecified: Secondary | ICD-10-CM | POA: Diagnosis not present

## 2017-03-15 DIAGNOSIS — R9431 Abnormal electrocardiogram [ECG] [EKG]: Secondary | ICD-10-CM | POA: Diagnosis not present

## 2017-03-15 DIAGNOSIS — Z66 Do not resuscitate: Secondary | ICD-10-CM | POA: Diagnosis present

## 2017-03-15 DIAGNOSIS — R531 Weakness: Secondary | ICD-10-CM | POA: Diagnosis not present

## 2017-03-15 DIAGNOSIS — J189 Pneumonia, unspecified organism: Secondary | ICD-10-CM | POA: Diagnosis not present

## 2017-03-15 DIAGNOSIS — A419 Sepsis, unspecified organism: Secondary | ICD-10-CM | POA: Diagnosis present

## 2017-03-15 DIAGNOSIS — R2681 Unsteadiness on feet: Secondary | ICD-10-CM | POA: Diagnosis not present

## 2017-03-15 DIAGNOSIS — F0280 Dementia in other diseases classified elsewhere without behavioral disturbance: Secondary | ICD-10-CM | POA: Diagnosis not present

## 2017-03-15 DIAGNOSIS — S72141S Displaced intertrochanteric fracture of right femur, sequela: Secondary | ICD-10-CM | POA: Diagnosis not present

## 2017-03-15 DIAGNOSIS — S72141A Displaced intertrochanteric fracture of right femur, initial encounter for closed fracture: Secondary | ICD-10-CM | POA: Diagnosis not present

## 2017-03-15 DIAGNOSIS — S728X9A Other fracture of unspecified femur, initial encounter for closed fracture: Secondary | ICD-10-CM | POA: Diagnosis not present

## 2017-03-15 DIAGNOSIS — G9340 Encephalopathy, unspecified: Secondary | ICD-10-CM | POA: Diagnosis not present

## 2017-03-15 DIAGNOSIS — Z7189 Other specified counseling: Secondary | ICD-10-CM | POA: Diagnosis not present

## 2017-03-15 DIAGNOSIS — R319 Hematuria, unspecified: Secondary | ICD-10-CM | POA: Diagnosis not present

## 2017-03-15 DIAGNOSIS — Z7982 Long term (current) use of aspirin: Secondary | ICD-10-CM | POA: Diagnosis not present

## 2017-03-15 DIAGNOSIS — G309 Alzheimer's disease, unspecified: Secondary | ICD-10-CM | POA: Diagnosis present

## 2017-03-15 DIAGNOSIS — I96 Gangrene, not elsewhere classified: Secondary | ICD-10-CM | POA: Diagnosis not present

## 2017-03-15 DIAGNOSIS — G301 Alzheimer's disease with late onset: Secondary | ICD-10-CM | POA: Diagnosis not present

## 2017-03-15 DIAGNOSIS — F419 Anxiety disorder, unspecified: Secondary | ICD-10-CM | POA: Diagnosis present

## 2017-03-15 LAB — CBC
HCT: 27.3 % — ABNORMAL LOW (ref 36.0–46.0)
Hemoglobin: 9.2 g/dL — ABNORMAL LOW (ref 12.0–15.0)
MCH: 30 pg (ref 26.0–34.0)
MCHC: 33.7 g/dL (ref 30.0–36.0)
MCV: 88.9 fL (ref 78.0–100.0)
Platelets: 383 K/uL (ref 150–400)
RBC: 3.07 MIL/uL — ABNORMAL LOW (ref 3.87–5.11)
RDW: 15.1 % (ref 11.5–15.5)
WBC: 10.5 K/uL (ref 4.0–10.5)

## 2017-03-15 LAB — COMPREHENSIVE METABOLIC PANEL WITH GFR
ALT: 8 U/L — ABNORMAL LOW (ref 14–54)
AST: 26 U/L (ref 15–41)
Albumin: 2 g/dL — ABNORMAL LOW (ref 3.5–5.0)
Alkaline Phosphatase: 91 U/L (ref 38–126)
Anion gap: 9 (ref 5–15)
BUN: 15 mg/dL (ref 6–20)
CO2: 25 mmol/L (ref 22–32)
Calcium: 8.5 mg/dL — ABNORMAL LOW (ref 8.9–10.3)
Chloride: 99 mmol/L — ABNORMAL LOW (ref 101–111)
Creatinine, Ser: 0.48 mg/dL (ref 0.44–1.00)
GFR calc Af Amer: >60 mL/min
GFR calc non Af Amer: >60 mL/min
Glucose, Bld: 97 mg/dL (ref 65–99)
Potassium: 4.6 mmol/L (ref 3.5–5.1)
Sodium: 133 mmol/L — ABNORMAL LOW (ref 135–145)
Total Bilirubin: 0.4 mg/dL (ref 0.3–1.2)
Total Protein: 5.6 g/dL — ABNORMAL LOW (ref 6.5–8.1)

## 2017-03-15 LAB — CULTURE, BLOOD (ROUTINE X 2)
CULTURE: NO GROWTH
Culture: NO GROWTH
SPECIAL REQUESTS: ADEQUATE
Special Requests: ADEQUATE

## 2017-03-15 SURGERY — LOWER EXTREMITY ANGIOGRAPHY
Anesthesia: Moderate Sedation | Laterality: Left

## 2017-03-15 MED ORDER — AMOXICILLIN-POT CLAVULANATE 875-125 MG PO TABS
1.0000 | ORAL_TABLET | Freq: Two times a day (BID) | ORAL | Status: DC
  Administered 2017-03-15: 1 via ORAL
  Filled 2017-03-15: qty 1

## 2017-03-15 MED ORDER — AMOXICILLIN-POT CLAVULANATE 500-125 MG PO TABS: 1.0000 | ORAL_TABLET | Freq: Two times a day (BID) | ORAL | 0 refills | Status: DC

## 2017-03-15 MED ORDER — POLYETHYLENE GLYCOL 3350 17 G PO PACK: 17.0000 g | PACK | Freq: Every day | ORAL | 0 refills | Status: DC | PRN

## 2017-03-15 MED ORDER — DOCUSATE SODIUM 100 MG PO CAPS: 100.0000 mg | ORAL_CAPSULE | Freq: Two times a day (BID) | ORAL | 0 refills | Status: DC

## 2017-03-15 MED ORDER — ENSURE ENLIVE PO LIQD: 237.0000 mL | Freq: Two times a day (BID) | ORAL | 12 refills | Status: DC

## 2017-03-15 MED ORDER — AMOXICILLIN-POT CLAVULANATE 500-125 MG PO TABS
1.0000 | ORAL_TABLET | Freq: Two times a day (BID) | ORAL | Status: DC
  Filled 2017-03-15: qty 1

## 2017-03-15 MED ORDER — AMOXICILLIN-POT CLAVULANATE 875-125 MG PO TABS: 1.0000 | ORAL_TABLET | Freq: Two times a day (BID) | ORAL | 0 refills | Status: DC

## 2017-03-15 MED ORDER — METHOCARBAMOL 500 MG PO TABS: 500.0000 mg | ORAL_TABLET | Freq: Four times a day (QID) | ORAL | 1 refills | Status: DC

## 2017-03-15 MED ORDER — HYDROCODONE-ACETAMINOPHEN 5-325 MG PO TABS: 1.0000 | ORAL_TABLET | Freq: Three times a day (TID) | ORAL | 0 refills | Status: DC | PRN

## 2017-03-15 NOTE — Progress Notes (Signed)
PHARMACY NOTE:  ANTIMICROBIAL RENAL DOSAGE ADJUSTMENT  Current antimicrobial regimen includes a mismatch between antimicrobial dosage and estimated renal function.  As per policy approved by the Pharmacy & Therapeutics and Medical Executive Committees, the antimicrobial dosage will be adjusted accordingly.  Current antimicrobial dosage:  Augmentin 875/125 mg PO BID x 20 doses  Indication: left foot cellulitis, hx gangrene  Renal Function:  Estimated Creatinine Clearance: 29.5 mL/min (by C-G formula based on SCr of 0.48 mg/dL). []      On intermittent HD, scheduled: []      On CRRT    Antimicrobial dosage has been changed to:  Augmentin 500/125 mg BID x 19 more doses  Additional comments:   Kayla Sellitto D. , PharmD, BCPS Pager:  [phone] 03/15/2017, 2:04 PM

## 2017-03-15 NOTE — Care Management Important Message (Signed)
Important Message  Patient Details  Name: Kayla Collier MRN: [id number] Date of Birth: [birth year]   Medicare Important Message Given:  Yes    Arjen Deringer 03/15/2017, 1:07 PM

## 2017-03-15 NOTE — Progress Notes (Signed)
Pt is A&O x3, forgetful. Asissted back to bed with use of maxi lift. Right hip incision with staples intact, mipelex dressing in place. A copy of discharge instructions given to pt's daughter Misty Stanley. Report given to Yvetta Coder at [facility]. Transferred pt to SNF, [facility].

## 2017-03-15 NOTE — Consult Note (Signed)
Pt at baseline confusion Dry gangrene left first toe unchanged, no real pain  I spoke with pt daughter Fayne Norrie and updated her on plan and answered questions.  Will see her back in a few weeks to see how she is recovering from hip to decide on whether or not she is a candidate for intervention on her left leg arteries  She should be discharged to SNF on Levaquin to continue until she sees me back in the office  Fabienne Bruns, MD [facility] Office: [phone] Pager: [phone]

## 2017-03-15 NOTE — Telephone Encounter (Signed)
Sched appt 04/08/17 lab at 1:00 and MD at 1:30. Spoke to pt's daughter to inform them of appt.

## 2017-03-15 NOTE — Progress Notes (Signed)
Physical Therapy Treatment Patient Details Name: Kayla Collier MRN: [id number] DOB: December 14, 1917 Today's Date: 03/15/2017    History of Present Illness 83 y.o. female with a past medical history significant for seizures and HTN and LEFT toe gangrene who presents with hip pain after a fall. Right IM nail 03/11/17. Discussions continue regarding left toe and possible amputation.PMH: HTN, seizures, Alzheimers, anxiety, RA. HOH. Palliative care following with ? 6 months life expectancy.    PT Comments    Pt admitted with above diagnosis. Pt currently with functional limitations due to balance and endurance deficits. Pt was able to sit EOB with min to min guard assist for a few minutes, varied from total to min guard as pt has episodes where she would get fearful and she would extend trunk and need assist to sit back on bed.  Overall sitting a little better.  Decided to use Maximove to get pt OOB as she has not been OOB.  Progressing slowly.  Pt will benefit from skilled PT to increase their independence and safety with mobility to allow discharge to the venue listed below.     Follow Up Recommendations  SNF     Equipment Recommendations  Other (comment) (TBA next venue)    Recommendations for Other Services       Precautions / Restrictions Precautions Precautions: Fall Restrictions Weight Bearing Restrictions: Yes RLE Weight Bearing: Weight bearing as tolerated    Mobility  Bed Mobility Overal bed mobility: Needs Assistance Bed Mobility: Supine to Sit     Supine to sit: Total assist;+2 for physical assistance     General bed mobility comments: needs assist for trunk mobility and scooting out towards EOB with use of pad.  Pt having incr pain with inability to help as much with pt also fearful and grasping bed rail.  Once at EOB,pt needed max to mod assist initially to sit EOB leaning posteriorly and to left.  Once pt cued and given time she was able to sit with min to min guard assist   for up to 8 min.  It was still difficult for pt to  get feet on the floor as she keeps them out in front of her.  Transfers Overall transfer level: Needs assistance Equipment used: None Transfers: Lateral/Scoot Transfers          Lateral/Scoot Transfers: Total assist;+2 physical assistance (with maximove lift) General transfer comment: Used maximove to lift pt to chair as she could not get her feet on floor well enough to stand and pivot.   Ambulation/Gait             General Gait Details: unable   Stairs            Wheelchair Mobility    Modified Rankin (Stroke Patients Only)       Balance Overall balance assessment: History of Falls;Needs assistance Sitting-balance support: Feet supported;Bilateral upper extremity supported Sitting balance-Leahy Scale: Poor Sitting balance - Comments: unable to sit without support. Initially was total assist but  did progress to min to min guard assist last several minutes of sitting.                                      Cognition Arousal/Alertness: Awake/alert Behavior During Therapy: WFL for tasks assessed/performed Overall Cognitive Status: History of cognitive impairments - at baseline  Exercises General Exercises - Lower Extremity Long Arc Quad: AROM;Both;10 reps;Seated    General Comments        Pertinent Vitals/Pain Pain Assessment: 0-10 Faces Pain Scale: Hurts worst Pain Location: R hip and left toe Pain Descriptors / Indicators: Grimacing;Aching;Guarding;Operative site guarding Pain Intervention(s): Limited activity within patient's tolerance;Monitored during session;Repositioned    Home Living                      Prior Function            PT Goals (current goals can now be found in the care plan section) Progress towards PT goals: Progressing toward goals    Frequency    Min 3X/week      PT Plan Current plan  remains appropriate    Co-evaluation              AM-PAC PT "6 Clicks" Daily Activity  Outcome Measure  Difficulty turning over in bed (including adjusting bedclothes, sheets and blankets)?: Total Difficulty moving from lying on back to sitting on the side of the bed? : Total Difficulty sitting down on and standing up from a chair with arms (e.g., wheelchair, bedside commode, etc,.)?: Total Help needed moving to and from a bed to chair (including a wheelchair)?: Total Help needed walking in hospital room?: Total Help needed climbing 3-5 steps with a railing? : Total 6 Click Score: 6    End of Session Equipment Utilized During Treatment: Gait belt Activity Tolerance: Patient limited by pain;Patient limited by fatigue Patient left: with call bell/phone within reach;in chair;with chair alarm set Nurse Communication: Mobility status;Need for lift equipment PT Visit Diagnosis: Unsteadiness on feet (R26.81);Repeated falls (R29.6);Muscle weakness (generalized) (M62.81);History of falling (Z91.81);Pain Pain - Right/Left: Left Pain - part of body: Hip;Ankle and joints of foot (right hip and left foot)     Time: 1610-9604 PT Time Calculation (min) (ACUTE ONLY): 25 min  Charges:  $Therapeutic Activity: 23-37 mins                    G Codes:       Marquail Bradwell,PT Acute Rehabilitation [phone]   Berline Lopes 03/15/2017, 1:28 PM

## 2017-03-15 NOTE — Discharge Summary (Addendum)
Physician Discharge Summary  Indira Sorenson MRN: [id number] DOB/AGE: [birth year] 83 y.o.  PCP: Vernie Shanks, MD   Admit date: 03/10/2017 Discharge date: 03/15/2017  Discharge Diagnoses:   Principal Problem:   Closed comminuted intertrochanteric fracture of proximal end of right femur [facility]) Active Problems:   Collier of foot ([place])   Essential hypertension   Seizures ([place])   Alzheimer's type dementia with late onset without behavioral disturbance   Closed fracture of trochanter of right femur (HCC)   Goals of care, counseling/discussion   Palliative care encounter    Follow-up recommendations Follow-up with PCP in 3-5 days , including all  additional recommended appointments as below Follow-up CBC, CMP in 3-5 days Follow-up with vascular surgery Dr fields, Recommend palliative care to continue following the patient had SNF Dc to snf with foley    Current Discharge Medication List    START taking these medications   Details  amoxicillin-clavulanate (AUGMENTIN) 500-125 MG tablet Take 1 tablet (500 mg total) by mouth 2 (two) times daily. Qty: 19 tablet, Refills: 0    docusate sodium (COLACE) 100 MG capsule Take 1 capsule (100 mg total) by mouth 2 (two) times daily. Qty: 10 capsule, Refills: 0    enoxaparin (LOVENOX) 40 MG/0.4ML injection Inject 0.4 mLs (40 mg total) into the skin daily. Qty: 14 Syringe, Refills: 0    feeding supplement, ENSURE ENLIVE, (ENSURE ENLIVE) LIQD Take 237 mLs by mouth 2 (two) times daily between meals. Qty: 237 mL, Refills: 12    HYDROcodone-acetaminophen (NORCO/VICODIN) 5-325 MG tablet Take 1-2 tablets by mouth every 8 (eight) hours as needed for moderate pain. Qty: 30 tablet, Refills: 0    methocarbamol (ROBAXIN) 500 MG tablet Take 1 tablet (500 mg total) by mouth 4 (four) times daily. Qty: 30 tablet, Refills: 1    polyethylene glycol (MIRALAX / GLYCOLAX) packet Take 17 g by mouth daily as needed for mild constipation. Qty: 14 each,  Refills: 0    traMADol (ULTRAM) 50 MG tablet Take 1 tablet (50 mg total) by mouth every 6 (six) hours as needed. Qty: 30 tablet, Refills: 0      CONTINUE these medications which have NOT CHANGED   Details  acetaminophen (TYLENOL) 500 MG tablet Take 1,000 mg by mouth 2 (two) times daily.    amLODipine (NORVASC) 5 MG tablet Take 5 mg by mouth daily.    aspirin EC 81 MG tablet Take 81 mg by mouth daily.    atenolol (TENORMIN) 50 MG tablet Take 50 mg by mouth daily.    cholecalciferol (VITAMIN D) 1000 units tablet Take 1,000 Units by mouth daily.    gabapentin (NEURONTIN) 100 MG capsule Take 100 mg by mouth 3 (three) times daily.    Misc Natural Products (TART CHERRY ADVANCED) CAPS Take 1 capsule by mouth daily.    oxyCODONE (OXY IR/ROXICODONE) 5 MG immediate release tablet Take 1 tablet (5 mg total) by mouth every 4 (four) hours as needed for severe pain. Qty: 30 tablet, Refills: 0    !! phenytoin (DILANTIN) 100 MG ER capsule Take 100 mg by mouth at bedtime.     !! phenytoin (DILANTIN) 30 MG ER capsule Take 30 mg by mouth 3 (three) times a week. Take at bedtime with 19m on Monday, Wednesday, and Friday    potassium chloride (MICRO-K) 10 MEQ CR capsule Take 10 mEq by mouth daily.     vitamin B-12 (CYANOCOBALAMIN) 1000 MCG tablet Take 1,000 mcg by mouth daily.    vitamin E (VITAMIN  E) 400 UNIT capsule Take 400 Units by mouth daily.     !! - Potential duplicate medications found. Please discuss with provider.        Discharge Condition: Stable Discharge Instructions Get Medicines reviewed and adjusted: Please take all your medications with you for your next visit with your Primary MD  Please request your Primary MD to go over all hospital tests and procedure/radiological results at the follow up, please ask your Primary MD to get all Hospital records sent to his/her office.  If you experience worsening of your admission symptoms, develop shortness of breath, life  threatening emergency, suicidal or homicidal thoughts you must seek medical attention immediately by calling 911 or calling your MD immediately if symptoms less severe.  You must read complete instructions/literature along with all the possible adverse reactions/side effects for all the Medicines you take and that have been prescribed to you. Take any new Medicines after you have completely understood and accpet all the possible adverse reactions/side effects.   Do not drive when taking Pain medications.   Do not take more than prescribed Pain, Sleep and Anxiety Medications  Special Instructions: If you have smoked or chewed Tobacco in the last 2 yrs please stop smoking, stop any regular Alcohol and or any Recreational drug use.  Wear Seat belts while driving.  Please note  You were cared for by a hospitalist during your hospital stay. Once you are discharged, your primary care physician will handle any further medical issues. Please note that NO REFILLS for any discharge medications will be authorized once you are discharged, as it is imperative that you return to your primary care physician (or establish a relationship with a primary care physician if you do not have one) for your aftercare needs so that they can reassess your need for medications and monitor your lab values.  Discharge Instructions    Weight bearing as tolerated    Complete by:  As directed        Allergies  Allergen Reactions  . Vicodin [Hydrocodone-Acetaminophen] Other (See Comments)    hallucinations      Disposition: 03-Skilled Nursing Facility   Consults:    Orthopedics Vascular surgery   Significant Diagnostic Studies:  Ct Head Wo Contrast  Result Date: 03/10/2017 CLINICAL DATA:  Fall. EXAM: CT HEAD WITHOUT CONTRAST TECHNIQUE: Contiguous axial images were obtained from the base of the skull through the vertex without intravenous contrast. COMPARISON:  05/10/2007 FINDINGS: Brain: Diffuse cerebral  atrophy. Ventricular dilatation consistent with central atrophy. Low-attenuation changes in the deep white matter consistent with small vessel ischemia. No mass effect or midline shift. No abnormal extra-axial fluid collections. Gray-white matter junctions are distinct. Basal cisterns are not effaced. No acute intracranial hemorrhage. Vascular: Vascular calcifications are present. Skull: Calvarium appears intact. Sinuses/Orbits: Old deformities of the medial orbital walls bilaterally, likely representing old fracture deformities. Paranasal sinuses and mastoid air cells are clear. Postoperative changes in the globes. Other: No significant change since prior study. IMPRESSION: No acute intracranial abnormalities. Chronic atrophy and small vessel ischemic changes. Electronically Signed   By: Lucienne Capers M.D.   On: 03/10/2017 02:46   Dg Chest Port 1 View  Result Date: 03/13/2017 CLINICAL DATA:  83 year old female with hypoxia EXAM: PORTABLE CHEST 1 VIEW COMPARISON:  Prior chest x-ray 11/06/2015 FINDINGS: Left basilar opacity obscures the diaphragm consistent with left lower lobe location. Stable mild cardiomegaly. Atherosclerotic calcifications again noted in the transverse aorta. No pulmonary edema. Chronic bronchitic changes and interstitial prominence  are similar compared to prior. No acute osseous abnormality. IMPRESSION: 1. Patchy airspace opacity in the left lower lobe obscures the hemidiaphragm. Differential considerations include left lower lobe pneumonia, left lower lobe atelectasis, and possibly left pleural effusion with associated left lower lobe atelectasis. Consider dedicated PA and lateral chest x-ray. 2. Stable cardiomegaly. 3.  Aortic Atherosclerosis (ICD10-170.0). Electronically Signed   By: Jacqulynn Cadet M.D.   On: 03/13/2017 10:06   Dg Foot Complete Left  Result Date: 02/19/2017 CLINICAL DATA:  Pain in the left great toe for the last several weeks. Some drainage. EXAM: LEFT FOOT -  COMPLETE 3+ VIEW COMPARISON:  None. FINDINGS: Distal all soft tissue swelling. No evidence of erosion or lytic change that would allow diagnosis of osteomyelitis by plain radiography. The remainder of the foot is negative except for some chronic arthritic change of the inter phalangeal joint of the small toe. IMPRESSION: Soft tissue swelling of the distal great toe. No underlying bone abnormality seen. Electronically Signed   By: Nelson Chimes M.D.   On: 02/19/2017 22:13   Dg C-arm 1-60 Min  Result Date: 03/11/2017 CLINICAL DATA:  Pain.  Fell. EXAM: RIGHT FEMUR 2 VIEWS; DG C-ARM 61-120 MIN COMPARISON:  Plain films 03/10/2017. FINDINGS: Intraoperative C-arm films documenting placement of intramedullary rod with compression screw across a intertrochanteric fracture. Improved position and alignment. IMPRESSION: As above. Electronically Signed   By: Staci Righter M.D.   On: 03/11/2017 14:06   Dg Hip Unilat  With Pelvis 2-3 Views Right  Result Date: 03/10/2017 CLINICAL DATA:  Status post fall, with right hip deformity. Initial encounter. EXAM: DG HIP (WITH OR WITHOUT PELVIS) 2-3V RIGHT COMPARISON:  None. FINDINGS: There is a mildly comminuted right femoral intertrochanteric fracture, with overlying soft tissue swelling and medial angulation of the distal femur. No definite additional fractures are seen. Left femoral hardware is grossly unremarkable in appearance, without evidence of loosening. There is mild chronic deformity of the left inferior pubic ramus. The sacroiliac joints are grossly unremarkable in appearance. The visualized bowel gas pattern is unremarkable. Scattered vascular calcifications are seen. IMPRESSION: 1. Mildly comminuted right femoral intertrochanteric fracture, with overlying soft tissue swelling and medial angulation of the distal femur. 2. Scattered vascular calcifications seen. Electronically Signed   By: Garald Balding M.D.   On: 03/10/2017 02:43   Dg Femur, Min 2 Views Right  Result  Date: 03/11/2017 CLINICAL DATA:  Pain.  Fell. EXAM: RIGHT FEMUR 2 VIEWS; DG C-ARM 61-120 MIN COMPARISON:  Plain films 03/10/2017. FINDINGS: Intraoperative C-arm films documenting placement of intramedullary rod with compression screw across a intertrochanteric fracture. Improved position and alignment. IMPRESSION: As above. Electronically Signed   By: Staci Righter M.D.   On: 03/11/2017 14:06   Dg Femur Min 2 Views Right  Result Date: 03/10/2017 CLINICAL DATA:  Status post fall, with right hip deformity. Initial encounter. EXAM: RIGHT FEMUR 2 VIEWS COMPARISON:  None. FINDINGS: The comminuted right femoral intertrochanteric fracture is characterized on right hip radiographs. The distal right femur appears intact. No knee joint effusion is seen. Diffuse vascular calcifications are noted. No significant degenerative change is noted at the right knee. IMPRESSION: 1. Comminuted right femoral intertrochanteric fracture. 2. Diffuse vascular calcifications seen. Electronically Signed   By: Garald Balding M.D.   On: 03/10/2017 02:44    echocardiogram     Filed Weights   03/10/17 1735  Weight: 45.4 kg (100 lb)     Microbiology: Recent Results (from the past 240 hour(s))  Culture,  blood (routine x 2)     Status: None (Preliminary result)   Collection Time: 03/10/17 10:21 AM  Result Value Ref Range Status   Specimen Description BLOOD RIGHT HAND  Final   Special Requests IN PEDIATRIC BOTTLE Blood Culture adequate volume  Final   Culture NO GROWTH 4 DAYS  Final   Report Status PENDING  Incomplete  Culture, blood (routine x 2)     Status: None (Preliminary result)   Collection Time: 03/10/17 10:31 AM  Result Value Ref Range Status   Specimen Description BLOOD RIGHT ARM  Final   Special Requests IN PEDIATRIC BOTTLE Blood Culture adequate volume  Final   Culture NO GROWTH 4 DAYS  Final   Report Status PENDING  Incomplete  Surgical pcr screen     Status: None   Collection Time: 03/10/17  3:41 PM   Result Value Ref Range Status   MRSA, PCR NEGATIVE NEGATIVE Final   Staphylococcus aureus NEGATIVE NEGATIVE Final    Comment:        The Xpert SA Assay (FDA approved for NASAL specimens in patients over 46 years of age), is one component of a comprehensive surveillance program.  Test performance has been validated by [facility] for patients greater than or equal to 86 year old. It is not intended to diagnose infection nor to guide or monitor treatment.        Blood Culture    Component Value Date/Time   SDES BLOOD RIGHT ARM 03/10/2017 1031   SPECREQUEST IN PEDIATRIC BOTTLE Blood Culture adequate volume 03/10/2017 1031   CULT NO GROWTH 4 DAYS 03/10/2017 1031   REPTSTATUS PENDING 03/10/2017 1031      Labs: Results for orders placed or performed during the hospital encounter of 03/10/17 (from the past 48 hour(s))  CBC     Status: Abnormal   Collection Time: 03/14/17  3:33 AM  Result Value Ref Range   WBC 12.4 (H) 4.0 - 10.5 K/uL   RBC 2.88 (L) 3.87 - 5.11 MIL/uL   Hemoglobin 8.8 (L) 12.0 - 15.0 g/dL   HCT 25.6 (L) 36.0 - 46.0 %   MCV 88.9 78.0 - 100.0 fL   MCH 30.6 26.0 - 34.0 pg   MCHC 34.4 30.0 - 36.0 g/dL   RDW 15.1 11.5 - 15.5 %   Platelets 322 150 - 400 K/uL  Comprehensive metabolic panel     Status: Abnormal   Collection Time: 03/15/17  5:09 AM  Result Value Ref Range   Sodium 133 (L) 135 - 145 mmol/L   Potassium 4.6 3.5 - 5.1 mmol/L   Chloride 99 (L) 101 - 111 mmol/L   CO2 25 22 - 32 mmol/L   Glucose, Bld 97 65 - 99 mg/dL   BUN 15 6 - 20 mg/dL   Creatinine, Ser 0.48 0.44 - 1.00 mg/dL   Calcium 8.5 (L) 8.9 - 10.3 mg/dL   Total Protein 5.6 (L) 6.5 - 8.1 g/dL   Albumin 2.0 (L) 3.5 - 5.0 g/dL   AST 26 15 - 41 U/L   ALT 8 (L) 14 - 54 U/L   Alkaline Phosphatase 91 38 - 126 U/L   Total Bilirubin 0.4 0.3 - 1.2 mg/dL   GFR calc non Af Amer >60 >60 mL/min   GFR calc Af Amer >60 >60 mL/min    Comment: (NOTE) The eGFR has been calculated using the CKD EPI  equation. This calculation has not been validated in all clinical situations. eGFR's persistently <60 mL/min signify  possible Chronic Kidney Disease.    Anion gap 9 5 - 15  CBC     Status: Abnormal   Collection Time: 03/15/17  5:09 AM  Result Value Ref Range   WBC 10.5 4.0 - 10.5 K/uL   RBC 3.07 (L) 3.87 - 5.11 MIL/uL   Hemoglobin 9.2 (L) 12.0 - 15.0 g/dL   HCT 27.3 (L) 36.0 - 46.0 %   MCV 88.9 78.0 - 100.0 fL   MCH 30.0 26.0 - 34.0 pg   MCHC 33.7 30.0 - 36.0 g/dL   RDW 15.1 11.5 - 15.5 %   Platelets 383 150 - 400 K/uL     Lipid Panel  No results found for: CHOL, TRIG, HDL, CHOLHDL, VLDL, LDLCALC, LDLDIRECT   No results found for: HGBA1C   Lab Results  Component Value Date   CREATININE 0.48 03/15/2017     HPI :  Kayla Collier who presents with hip pain after a fall.Radiograph of the RIGHT hip showed a comminuted right intertrochanteric fracture  -The case was discussed withDr. Molli Hazard recommended transfer to [facility] and evaluation for possible repair on Thursday   HOSPITAL COURSE:    1. Hip fracture:s/p pertrochanteric fracture with intramedullary implant 5/10, performed by Dr. Erlinda Hong The patient deemed  high medical risk given her age, uncontrolled BP, frailty, gangrenous foot. Gupta risk 2.9%. Telemetry stable -Hydrocodone-acetaminophen/tramadol/Robaxin for pain control   -Bed rest, apply ice, document sedation and vitals per Hip fracture protocol Transfused 1 unit packed red blood cells prior to surgery 5/10, hemoglobin. Has been stable Palliative care following secondary to multiple comorbidities Plan is to discharge to SNF Orthopedic started Lovenox for DVT prophylaxis    2. Collier of the LEFT foot: According to [facility] Core[facility] dew [facility], patient hascritical limb threatening situation. Continue local wound care. Angiogram to be scheduled as soon as possible  Evaluated by  [facility] V[facility] Surgery and planned for angiogram in 1 week. Patient was on Augmentin, off for 5 days. Placed back on Zosyn pending decision about angiogram of the left leg, assess possible need for amputation. Patient refuses amputation, but daughter interested in revascularization Vascular surgery consulted   Dr. Marc Morgans will be elective procedure per Dr Oneida Alar , patient does see Dr. Oneida Alar in the office Daughter   would also like Dr. Oneida Alar to manager her mother's vascular needs after discharge (  not Dr. Lucky Cowboy)   3. Hypertension Hypertensive at admission. -Continue atenolol, amlodipine -Continue aspirin Added prn hydralazine  4. Seizures: -Continue Dilantin and gabapentin  5. Dementia: Delirium precautions:  -Lights and TV off, minimize interruptions at night -Blinds open and lights on during day -Glasses/hearing aid with patient -Frequent reorientation -PT/OT when able -Avoid sedation medications  Palliative care consultation-doubt patient would be a candidate for aggressive vascular intervention for the left leg  6. Hypokalemia Repleted  7. Anemia Noted to have some bleeding, around the Foley, likely from trauma, UA on 03/10/17 did not show gross hematuria. Urine in the Foley bag is clear. Status post 1 unit of packed red blood cells this admission, hemoglobin stable Monitor CBC closely  8.Hypoxia/possible pneumonia on chest x-ray from 03/13/17  Patient has been on antibiotics, namely Zosyn since admission, now switched to Augmentin for another 10 days for the left leg, this should be adequate coverage for pneumonia SLP eval Regular;Thin liquid   Discharge Exam  Blood pressure (!) 169/81, pulse 78, temperature 98 F (36.7 C), temperature  source Oral, resp. rate 18, height 5' (1.524 m), weight 45.4 kg (100 lb), SpO2 99 %. General exam: Appears calm and comfortable  Respiratory system:  Clear to auscultation. Respiratory effort normal. Cardiovascular system: S1 & S2 heard, RRR. No JVD, murmurs, rubs, gallops or clicks. No pedal edema. Gastrointestinal system: Abdomen is nondistended, soft and nontender. No organomegaly or masses felt. Normal bowel sounds heard. Central nervous system:Awake and comfortable . No focal neurological deficits. Extremities: Symmetric 5 x 5 power. Skin: No rashes, lesions or ulcers     Follow-up Information    Leandrew Koyanagi, MD Follow up in 2 week(s).   Specialty:  Orthopedic Surgery Why:  For suture removal, For wound re-check Contact information: [place] [address] [phone]        Vernie Shanks, MD. Call.   Specialty:  Family Medicine Why:  3[address] [place] [facility] Contact information: [place] [address] [phone]        Elam Dutch, MD. Call.   Specialties:  Vascular Surgery, Cardiology Why:  [place] [facility]  Contact information: [address] [place] [address] [phone]           Signed: Reyne Dumas 03/15/2017, 8:41 AM        Time spent >45 mins

## 2017-03-15 NOTE — Clinical Social Work Placement (Signed)
   CLINICAL SOCIAL WORK PLACEMENT  NOTE  Date:  03/15/2017  Patient Details  Name: Kayla Collier MRN: [id number] Date of Birth: June 07, 1918  Clinical Social Work is seeking post-discharge placement for this patient at the   level of care (*CSW will initial, date and re-position this form in  chart as items are completed):      Patient/family provided with [facility] Clinical Social Work Department's list of facilities offering this level of care within the geographic area requested by the patient (or if unable, by the patient's family).  Yes   Patient/family informed of their freedom to choose among providers that offer the needed level of care, that participate in Medicare, Medicaid or managed care program needed by the patient, have an available bed and are willing to accept the patient.      Patient/family informed of 's ownership interest in [facility] and [facility], as well as of the fact that they are under no obligation to receive care at these facilities.  PASRR submitted to EDS on       PASRR number received on 03/14/17     Existing PASRR number confirmed on       FL2 transmitted to all facilities in geographic area requested by pt/family on 03/14/17     FL2 transmitted to all facilities within larger geographic area on       Patient informed that his/her managed care company has contracts with or will negotiate with certain facilities, including the following:        Yes   Patient/family informed of bed offers received.  Patient chooses bed at [facility]     Physician recommends and patient chooses bed at      Patient to be transferred to [facility] on 03/15/17.  Patient to be transferred to facility by [facility]     Patient family notified on 03/15/17 of transfer.  Name of family member notified:  Misty Stanley     PHYSICIAN       Additional Comment:    _______________________________________________ Maree Krabbe, LCSW 03/15/2017, 10:34  AM

## 2017-03-15 NOTE — Discharge Instructions (Signed)
° ° °  1. Change dressings as needed 2. May shower but keep incisions covered and dry 3. Take lovenox to prevent blood clots 4. Take stool softeners as needed 5. Take pain meds as needed 6. Cont foley until mobility is better

## 2017-03-15 NOTE — Telephone Encounter (Signed)
-----   Message from Sharee Pimple, RN sent at 03/15/2017  1:35 PM EDT ----- Regarding: 2-3 weeks w/ ABIs   ----- Message ----- From: Sherren Kerns, MD Sent: 03/15/2017   1:32 PM To: [facility] Charge Pool  Pt needs follow up with me 2-3 weeks with ABIs  Daily visit to check toe today.  Daughter updated on plan  Fabienne Bruns

## 2017-03-15 NOTE — Clinical Social Work Note (Addendum)
Clinical Social Worker facilitated patient discharge including contacting patient family and facility to confirm patient discharge plans.  Clinical information faxed to facility and family agreeable with plan.  CSW arranged ambulance transport via [facility] to [facility].  RN to call [phone] for report prior to discharge. Patient will go to room 1206.  Clinical Social Worker will sign off for now as social work intervention is no longer needed. Please consult [place] again if new need arises.  [place], [place] [phone]

## 2017-03-17 ENCOUNTER — Non-Acute Institutional Stay (SKILLED_NURSING_FACILITY): Payer: Medicare Other | Admitting: Internal Medicine

## 2017-03-17 ENCOUNTER — Encounter: Payer: Self-pay | Admitting: Internal Medicine

## 2017-03-17 DIAGNOSIS — R531 Weakness: Secondary | ICD-10-CM

## 2017-03-17 DIAGNOSIS — R569 Unspecified convulsions: Secondary | ICD-10-CM

## 2017-03-17 DIAGNOSIS — I10 Essential (primary) hypertension: Secondary | ICD-10-CM

## 2017-03-17 DIAGNOSIS — D62 Acute posthemorrhagic anemia: Secondary | ICD-10-CM

## 2017-03-17 DIAGNOSIS — R2681 Unsteadiness on feet: Secondary | ICD-10-CM | POA: Diagnosis not present

## 2017-03-17 DIAGNOSIS — E440 Moderate protein-calorie malnutrition: Secondary | ICD-10-CM

## 2017-03-17 DIAGNOSIS — I96 Gangrene, not elsewhere classified: Secondary | ICD-10-CM | POA: Diagnosis not present

## 2017-03-17 DIAGNOSIS — E871 Hypo-osmolality and hyponatremia: Secondary | ICD-10-CM

## 2017-03-17 DIAGNOSIS — G301 Alzheimer's disease with late onset: Secondary | ICD-10-CM

## 2017-03-17 DIAGNOSIS — J189 Pneumonia, unspecified organism: Secondary | ICD-10-CM | POA: Diagnosis not present

## 2017-03-17 DIAGNOSIS — S72141S Displaced intertrochanteric fracture of right femur, sequela: Secondary | ICD-10-CM | POA: Diagnosis not present

## 2017-03-17 DIAGNOSIS — F0280 Dementia in other diseases classified elsewhere without behavioral disturbance: Secondary | ICD-10-CM

## 2017-03-17 DIAGNOSIS — K5900 Constipation, unspecified: Secondary | ICD-10-CM

## 2017-03-17 NOTE — Progress Notes (Signed)
LOCATION: [facility]  PCP: Ileana Ladd, MD   Code Status: DNR  Goals of care: Advanced Directive information Advanced Directives 03/17/2017  Does Patient Have a Medical Advance Directive? Yes  Type of Advance Directive Out of facility DNR (pink MOST or yellow form)  Does patient want to make changes to medical advance directive? No - Patient declined  Copy of Healthcare Power of Attorney in Chart? -  Pre-existing out of facility DNR order (yellow form or pink MOST form) -       Extended Emergency Contact Information Primary Emergency Contact: Taylor,Lisa Address: [address]          [place], [place] [address] [place] Home Phone: [phone] Relation: Daughter Secondary Emergency Contact: Robinson,Joyce  [place] Home Phone: [phone] Relation: Other   No Known Allergies  Chief Complaint  Patient presents with  . New Admit To SNF    New Admission Visit      HPI:  Patient is a 83 y.o. female seen today for short term rehabilitation post hospital admission from 03/10/17-03/15/17 with closed right femur comminuted intertrochanteric fracture of proximal end of right femur. She underwent IM nail. She received 1 u prbc transfusion for ABLA. She was noted to have left great toe gangrene and was seen by vascular team and plan is for angiogram outpatient. She has PMH of dementia, HTN, seizure among others. She is seen in her room today.   Review of Systems:  Constitutional: Negative for fever HENT: Negative for headache, congestion, nasal discharge, sore throat, difficulty swallowing.   Eyes: Negative for double vision and discharge.  Respiratory: Negative for shortness of breath and wheezing. Positive for dry cough.   Cardiovascular: Negative for chest pain, palpitations, leg swelling.  Gastrointestinal: Negative for heartburn, nausea, vomiting, abdominal pain, loss of appetite. Last bowel movement was this  morning. Genitourinary: Negative for dysuria.  Musculoskeletal: Negative for back pain, fall in the facility. Positive for pain to right hip.  Skin: Negative for itching, rash.  Neurological: Negative for dizziness. Psychiatric/Behavioral: Negative for depression   Past Medical History:  Diagnosis Date  . Anxiety   . Arthritis    RA  . Closed fracture head of femur (HCC) 03/10/2017  . Gangrene (HCC)   . Hypertension   . Seizures (HCC)    Past Surgical History:  Procedure Laterality Date  . CATARACT EXTRACTION    . INTRAMEDULLARY (IM) NAIL INTERTROCHANTERIC Right 03/11/2017   Procedure: RIGHT INTRAMEDULLARY (IM) NAIL INTERTROCHANTERIC;  Surgeon: Tarry Kos, MD;  Location: [facility] OR;  Service: Orthopedics;  Laterality: Right;  . TOTAL HIP ARTHROPLASTY     Social History:   reports that she has never smoked. She has never used smokeless tobacco. She reports that she does not drink alcohol or use drugs.  Family History  Problem Relation Age of Onset  . Family history unknown: Yes    Medications: Allergies as of 03/17/2017   No Known Allergies     Medication List       Accurate as of 03/17/17 12:45 PM. Always use your most recent med list.          acetaminophen 500 MG tablet Commonly known as:  TYLENOL Take 1,000 mg by mouth 2 (two) times daily.   amLODipine 5 MG tablet Commonly known as:  NORVASC Take 5 mg by mouth daily.   amoxicillin-clavulanate 875-125 MG tablet Commonly known as:  AUGMENTIN Take 1 tablet by mouth 2 (two) times  daily. Stop date 03/25/17   aspirin EC 81 MG tablet Take 81 mg by mouth daily.   atenolol 50 MG tablet Commonly known as:  TENORMIN Take 50 mg by mouth daily.   cholecalciferol 1000 units tablet Commonly known as:  VITAMIN D Take 1,000 Units by mouth daily.   docusate sodium 100 MG capsule Commonly known as:  COLACE Take 1 capsule (100 mg total) by mouth 2 (two) times daily.   enoxaparin 40 MG/0.4ML injection Commonly known  as:  LOVENOX Inject 0.4 mLs (40 mg total) into the skin daily.   feeding supplement (PRO-STAT SUGAR FREE 64) Liqd Take 30 mLs by mouth 2 (two) times daily. Stop date 04/16/17   gabapentin 100 MG capsule Commonly known as:  NEURONTIN Take 100 mg by mouth 3 (three) times daily.   methocarbamol 500 MG tablet Commonly known as:  ROBAXIN Take 1 tablet (500 mg total) by mouth 4 (four) times daily.   phenytoin 100 MG ER capsule Commonly known as:  DILANTIN Take 100 mg by mouth at bedtime.   phenytoin 30 MG ER capsule Commonly known as:  DILANTIN Take 30 mg by mouth 3 (three) times a week. Take at bedtime with 100mg  on Monday, Wednesday, and Friday   polyethylene glycol packet Commonly known as:  MIRALAX / GLYCOLAX Take 17 g by mouth daily as needed for mild constipation.   potassium chloride 10 MEQ CR capsule Commonly known as:  MICRO-K Take 10 mEq by mouth daily.   TART CHERRY ADVANCED Caps Take 1 capsule by mouth daily.   traMADol 50 MG tablet Commonly known as:  ULTRAM Take 1 tablet (50 mg total) by mouth every 6 (six) hours as needed.   UNABLE TO FIND Med Name: Med pass 120 mL 2 times daily.   vitamin B-12 1000 MCG tablet Commonly known as:  CYANOCOBALAMIN Take 1,000 mcg by mouth daily.   vitamin E 400 UNIT capsule Generic drug:  vitamin E Take 400 Units by mouth daily.       Immunizations: Immunization History  Administered Date(s) Administered  . PPD Test 03/16/2017     Physical Exam:  Vitals:   03/17/17 1228  BP: (!) 175/77  Pulse: 81  Resp: 18  Temp: 97.5 F (36.4 C)  TempSrc: Oral  SpO2: 98%  Weight: 100 lb (45.4 kg)  Height: 5' (1.524 m)   Body mass index is 19.53 kg/m.  General- elderly female, frail, thin built, in no acute distress Head- normocephalic, atraumatic Nose-  no nasal discharge Throat- moist mucus membrane, normal oropharynx Eyes- PERRLA, EOMI, no pallor, no icterus, no discharge, normal conjunctiva, normal sclera Neck-  no cervical lymphadenopathy Cardiovascular- normal s1,s2, no murmur Respiratory- bilateral clear to auscultation, no wheeze, no rhonchi, no crackles, no use of accessory muscles Abdomen- bowel sounds present, soft, non tender, no guarding or rigidity Musculoskeletal- able to move all 4 extremities, limited right leg ROM, no leg edema Neurological- alert and oriented to person, place and time Skin- warm and dry, left great toe gangrene, right lateral thigh and knee surgical incision with dressing in place Psychiatry- normal mood and affect    Labs reviewed: Basic Metabolic Panel:  Recent Labs  16/10/96 1014  03/11/17 0344  03/12/17 0417 03/13/17 0522 03/15/17 0509  NA 125*  < > 133*  < > 136 133* 133*  K 2.9*  < > 2.9*  < > 3.8 4.7 4.6  CL 88*  < > 98*  --  100* 100* 99*  CO2  29  < > 27  --  25 23 25   GLUCOSE 153*  < > 106*  < > 160* 110* 97  BUN 12  < > 12  --  14 11 15   CREATININE 0.51  < > 0.55  --  0.62 0.52 0.48  CALCIUM 8.5*  < > 8.2*  --  8.0* 8.4* 8.5*  MG 1.7  --  1.5*  --   --   --   --   < > = values in this interval not displayed. Liver Function Tests:  Recent Labs  03/11/17 0344 03/12/17 0417 03/15/17 0509  AST 27 32 26  ALT 14 14 8*  ALKPHOS 86 75 91  BILITOT 0.4 0.4 0.4  PROT 5.3* 5.1* 5.6*  ALBUMIN 2.2* 2.1* 2.0*   No results for input(s): LIPASE, AMYLASE in the last 8760 hours. No results for input(s): AMMONIA in the last 8760 hours. CBC:  Recent Labs  03/10/17 0057  03/13/17 0522 03/14/17 0333 03/15/17 0509  WBC 8.9  < > 14.8* 12.4* 10.5  NEUTROABS 6.6  --   --   --   --   HGB 9.6*  < > 9.0* 8.8* 9.2*  HCT 28.9*  < > 27.9* 25.6* 27.3*  MCV 90.0  < > 89.1 88.9 88.9  PLT 386  < > 316 322 383  < > = values in this interval not displayed. Cardiac Enzymes: No results for input(s): CKTOTAL, CKMB, CKMBINDEX, TROPONINI in the last 8760 hours. BNP: Invalid input(s): POCBNP CBG: No results for input(s): GLUCAP in the last 8760  hours.  Radiological Exams: Ct Head Wo Contrast  Result Date: 03/10/2017 CLINICAL DATA:  Fall. EXAM: CT HEAD WITHOUT CONTRAST TECHNIQUE: Contiguous axial images were obtained from the base of the skull through the vertex without intravenous contrast. COMPARISON:  05/10/2007 FINDINGS: Brain: Diffuse cerebral atrophy. Ventricular dilatation consistent with central atrophy. Low-attenuation changes in the deep white matter consistent with small vessel ischemia. No mass effect or midline shift. No abnormal extra-axial fluid collections. Gray-white matter junctions are distinct. Basal cisterns are not effaced. No acute intracranial hemorrhage. Vascular: Vascular calcifications are present. Skull: Calvarium appears intact. Sinuses/Orbits: Old deformities of the medial orbital walls bilaterally, likely representing old fracture deformities. Paranasal sinuses and mastoid air cells are clear. Postoperative changes in the globes. Other: No significant change since prior study. IMPRESSION: No acute intracranial abnormalities. Chronic atrophy and small vessel ischemic changes. Electronically Signed   By: 11-28-1968 M.D.   On: 03/10/2017 02:46   Dg Chest Port 1 View  Result Date: 03/13/2017 CLINICAL DATA:  83 year old female with hypoxia EXAM: PORTABLE CHEST 1 VIEW COMPARISON:  Prior chest x-ray 11/06/2015 FINDINGS: Left basilar opacity obscures the diaphragm consistent with left lower lobe location. Stable mild cardiomegaly. Atherosclerotic calcifications again noted in the transverse aorta. No pulmonary edema. Chronic bronchitic changes and interstitial prominence are similar compared to prior. No acute osseous abnormality. IMPRESSION: 1. Patchy airspace opacity in the left lower lobe obscures the hemidiaphragm. Differential considerations include left lower lobe pneumonia, left lower lobe atelectasis, and possibly left pleural effusion with associated left lower lobe atelectasis. Consider dedicated PA and lateral  chest x-ray. 2. Stable cardiomegaly. 3.  Aortic Atherosclerosis (ICD10-170.0). Electronically Signed   By: 94 M.D.   On: 03/13/2017 10:06   Dg Foot Complete Left  Result Date: 02/19/2017 CLINICAL DATA:  Pain in the left great toe for the last several weeks. Some drainage. EXAM: LEFT FOOT - COMPLETE 3+ VIEW  COMPARISON:  None. FINDINGS: Distal all soft tissue swelling. No evidence of erosion or lytic change that would allow diagnosis of osteomyelitis by plain radiography. The remainder of the foot is negative except for some chronic arthritic change of the inter phalangeal joint of the small toe. IMPRESSION: Soft tissue swelling of the distal great toe. No underlying bone abnormality seen. Electronically Signed   By: Paulina Fusi M.D.   On: 02/19/2017 22:13   Dg C-arm 1-60 Min  Result Date: 03/11/2017 CLINICAL DATA:  Pain.  Fell. EXAM: RIGHT FEMUR 2 VIEWS; DG C-ARM 61-120 MIN COMPARISON:  Plain films 03/10/2017. FINDINGS: Intraoperative C-arm films documenting placement of intramedullary rod with compression screw across a intertrochanteric fracture. Improved position and alignment. IMPRESSION: As above. Electronically Signed   By: Elsie Stain M.D.   On: 03/11/2017 14:06   Dg Hip Unilat  With Pelvis 2-3 Views Right  Result Date: 03/10/2017 CLINICAL DATA:  Status post fall, with right hip deformity. Initial encounter. EXAM: DG HIP (WITH OR WITHOUT PELVIS) 2-3V RIGHT COMPARISON:  None. FINDINGS: There is a mildly comminuted right femoral intertrochanteric fracture, with overlying soft tissue swelling and medial angulation of the distal femur. No definite additional fractures are seen. Left femoral hardware is grossly unremarkable in appearance, without evidence of loosening. There is mild chronic deformity of the left inferior pubic ramus. The sacroiliac joints are grossly unremarkable in appearance. The visualized bowel gas pattern is unremarkable. Scattered vascular calcifications are seen.  IMPRESSION: 1. Mildly comminuted right femoral intertrochanteric fracture, with overlying soft tissue swelling and medial angulation of the distal femur. 2. Scattered vascular calcifications seen. Electronically Signed   By: Roanna Raider M.D.   On: 03/10/2017 02:43   Dg Femur, Min 2 Views Right  Result Date: 03/11/2017 CLINICAL DATA:  Pain.  Fell. EXAM: RIGHT FEMUR 2 VIEWS; DG C-ARM 61-120 MIN COMPARISON:  Plain films 03/10/2017. FINDINGS: Intraoperative C-arm films documenting placement of intramedullary rod with compression screw across a intertrochanteric fracture. Improved position and alignment. IMPRESSION: As above. Electronically Signed   By: Elsie Stain M.D.   On: 03/11/2017 14:06   Dg Femur Min 2 Views Right  Result Date: 03/10/2017 CLINICAL DATA:  Status post fall, with right hip deformity. Initial encounter. EXAM: RIGHT FEMUR 2 VIEWS COMPARISON:  None. FINDINGS: The comminuted right femoral intertrochanteric fracture is characterized on right hip radiographs. The distal right femur appears intact. No knee joint effusion is seen. Diffuse vascular calcifications are noted. No significant degenerative change is noted at the right knee. IMPRESSION: 1. Comminuted right femoral intertrochanteric fracture. 2. Diffuse vascular calcifications seen. Electronically Signed   By: Roanna Raider M.D.   On: 03/10/2017 02:44    Assessment/Plan  Generalized weakness With deconditioning. Has multiple medical co-morbidities and given her advanced age and dementia, has poor prognosis. Palliative care consult to review goals of care.  Unsteady gait Post fall with fracture. Will have patient work with PT/OT as tolerated to regain strength and restore function.  Fall precautions are in place.  Right proximal femurs closed fracture S/p IM nailing. WBAT to RLE. Will need orthopedic follow up. Will have patient work with PT/OT as tolerated to regain strength and restore function.  Fall precautions are in  place. Continue tylenol 1000 mg bid with tramadol 50 mg q6h prn pain and oxyIR 5 mg 1 tab q4h prn pain. PMR consult. Continue lovenox x 2 weeks for dvt prophylaxis. Continue robaxin 500 mg qid for muscle spasm. oxyIR was not provided with her  documented allergy to vicodin. On further review had tolerated hydrocodone-apap and oxycodone well in hospital and home. Resume OxyIR.   Left great toe gangrene Seen by vein and vascular. Will need f/u with vascular team for possible angiogram. Continue oxycodone IR as above for pain. Continue local wound care. Continue augmentin 875 mg q12h until 03/25/17  HCAP Continue and complete course of augmentin. SLP to follow. Aspiration precautions and to assist with feeding  ABLA S/p 1 u prbc transfusion, monitor cbc  Hypertension Elevated BP, currently on atenolol and amlodipine with aspirin. She has not been receiving her pain medication oxyIR and this could be contributing to pain further elevating her BP. Monitor bp q shift for now and if remains elevated, to adjust antihypertensives.   Protein calorie malnutrition Monitor po intake. RD consult. decline anticipated with her dementia.   Seizures Remains seizure free, continue dilantin and gabapentin  Dementia Supportive care  Constipation Continue docusate bid and prn miralax  Hyponatremia Check bmp   Goals of care: short term rehabilitation, possible long term care   Labs/tests ordered: cbc, cmp 03/18/17  Family/ staff Communication: reviewed care plan with patient and nursing supervisor    Oneal Grout, MD Internal Medicine [facility] [address] [place], [place] [address] Cell Phone (Monday-Friday 8 am - 5 pm): [phone] On Call: [phone] and follow prompts after 5 pm and on weekends Office Phone: [phone] Office Fax: [phone]

## 2017-03-19 DIAGNOSIS — M25551 Pain in right hip: Secondary | ICD-10-CM | POA: Diagnosis not present

## 2017-03-22 ENCOUNTER — Non-Acute Institutional Stay (SKILLED_NURSING_FACILITY): Payer: Medicare Other | Admitting: Family

## 2017-03-22 DIAGNOSIS — E8809 Other disorders of plasma-protein metabolism, not elsewhere classified: Secondary | ICD-10-CM | POA: Diagnosis not present

## 2017-03-22 DIAGNOSIS — D509 Iron deficiency anemia, unspecified: Secondary | ICD-10-CM

## 2017-03-22 DIAGNOSIS — E440 Moderate protein-calorie malnutrition: Secondary | ICD-10-CM

## 2017-03-22 NOTE — Progress Notes (Signed)
Location:  [facility] Room Number: 1206 P Place of Service:  SNF [phone]) Provider: Orlanda Frankum FNP-C  Ileana Ladd, MD  Patient Care Team: Ileana Ladd, MD as PCP - General (Family Medicine)  Extended Emergency Contact Information Primary Emergency Contact: Taylor,Lisa Address: [address]          [place], [place] [address] [place] of [place] Home Phone: [phone] Relation: Daughter Secondary Emergency Contact: Robinson,Joyce  [place] Home Phone: [phone] Relation: Other  Code Status:  DNR  Goals of care: Advanced Directive information Advanced Directives 03/17/2017  Does Patient Have a Medical Advance Directive? Yes  Type of Advance Directive Out of facility DNR (pink MOST or yellow form)  Does patient want to make changes to medical advance directive? No - Patient declined  Copy of Healthcare Power of Attorney in Chart? -  Pre-existing out of facility DNR order (yellow form or pink MOST form) -     Chief Complaint  Patient presents with  . Acute Visit    abnormal labs     HPI:  Pt is a 83 y.o. female seen today at [facility] for an acute visit for evaluation of evaluation of abnormal labs results.She is seen in her room today. She is denies any acute issues though HPI and ROS limited due to her cognitive impairment secondary to her dementia. Her recent lab results showed Hgb 9.5, HCT 28.8, TP 5.6, ALB 2.71 ( 03/18/2017). She is currently following up with facility Registered Dietician. Of note she is status post hospital admission from 03/10/2017-03/15/2017 with closed right femur comminunted intertrochanteric fracture of proximal end of right femur. She underwent IM nailing. She received one units of PRBC.    Past Medical History:  Diagnosis Date  . Anxiety   . Arthritis    RA  . Closed fracture head of femur (HCC) 03/10/2017  . Gangrene (HCC)   . Hypertension   . Seizures  (HCC)    Past Surgical History:  Procedure Laterality Date  . CATARACT EXTRACTION    . INTRAMEDULLARY (IM) NAIL INTERTROCHANTERIC Right 03/11/2017   Procedure: RIGHT INTRAMEDULLARY (IM) NAIL INTERTROCHANTERIC;  Surgeon: Tarry Kos, MD;  Location: [facility] OR;  Service: Orthopedics;  Laterality: Right;  . TOTAL HIP ARTHROPLASTY      No Known Allergies  Allergies as of 03/22/2017   No Known Allergies     Medication List       Accurate as of 03/22/17  3:08 PM. Always use your most recent med list.          acetaminophen 500 MG tablet Commonly known as:  TYLENOL Take 1,000 mg by mouth 2 (two) times daily.   amLODipine 5 MG tablet Commonly known as:  NORVASC Take 5 mg by mouth daily.   amoxicillin-clavulanate 875-125 MG tablet Commonly known as:  AUGMENTIN Take 1 tablet by mouth 2 (two) times daily. Stop date 03/25/17   aspirin EC 81 MG tablet Take 81 mg by mouth daily.   atenolol 50 MG tablet Commonly known as:  TENORMIN Take 50 mg by mouth daily.   cholecalciferol 1000 units tablet Commonly known as:  VITAMIN D Take 1,000 Units by mouth daily.   docusate sodium 100 MG capsule Commonly known as:  COLACE Take 1 capsule (100 mg total) by mouth 2 (two) times daily.   enoxaparin 40 MG/0.4ML injection Commonly known as:  LOVENOX Inject 0.4 mLs (40 mg total) into the skin  daily.   feeding supplement (PRO-STAT SUGAR FREE 64) Liqd Take 30 mLs by mouth 2 (two) times daily. Stop date 04/16/17   gabapentin 100 MG capsule Commonly known as:  NEURONTIN Take 100 mg by mouth 3 (three) times daily.   methocarbamol 500 MG tablet Commonly known as:  ROBAXIN Take 1 tablet (500 mg total) by mouth 4 (four) times daily.   phenytoin 100 MG ER capsule Commonly known as:  DILANTIN Take 100 mg by mouth at bedtime.   phenytoin 30 MG ER capsule Commonly known as:  DILANTIN Take 30 mg by mouth 3 (three) times a week. Take at bedtime with 100mg  on Monday, Wednesday, and Friday     polyethylene glycol packet Commonly known as:  MIRALAX / GLYCOLAX Take 17 g by mouth daily as needed for mild constipation.   potassium chloride 10 MEQ CR capsule Commonly known as:  MICRO-K Take 10 mEq by mouth daily.   TART CHERRY ADVANCED Caps Take 1 capsule by mouth daily.   traMADol 50 MG tablet Commonly known as:  ULTRAM Take 1 tablet (50 mg total) by mouth every 6 (six) hours as needed.   UNABLE TO FIND Med Name: Med pass 120 mL 2 times daily.   vitamin B-12 1000 MCG tablet Commonly known as:  CYANOCOBALAMIN Take 1,000 mcg by mouth daily.   vitamin E 400 UNIT capsule Generic drug:  vitamin E Take 400 Units by mouth daily.       Review of Systems  Constitutional: Negative for activity change, appetite change, chills, fatigue and fever.  HENT: Negative for congestion, rhinorrhea, sinus pain, sinus pressure, sneezing and sore throat.   Eyes: Negative.   Respiratory: Negative for cough, chest tightness, shortness of breath and wheezing.   Cardiovascular: Negative for chest pain, palpitations and leg swelling.  Gastrointestinal: Negative for abdominal distention, abdominal pain, constipation, diarrhea, nausea and vomiting.  Genitourinary: Negative for dysuria and urgency.       Foley catheter  Musculoskeletal: Positive for gait problem.       Right leg pain   Skin: Negative for color change, pallor, rash and wound.  Neurological: Negative for dizziness, tremors, seizures, light-headedness and headaches.  Hematological: Does not bruise/bleed easily.  Psychiatric/Behavioral: Negative for agitation, confusion, hallucinations and sleep disturbance. The patient is not nervous/anxious.     Immunization History  Administered Date(s) Administered  . PPD Test 03/16/2017   Pertinent  Health Maintenance Due  Topic Date Due  . DEXA SCAN  03/26/1985  . PNA vac Low Risk Adult (1 of 2 - PCV13) 03/26/1985  . INFLUENZA VACCINE  06/02/2017    Vitals:   03/22/17 1443  BP:  (!) 121/53  Pulse: 74  Resp: 18  Temp: 98.5 F (36.9 C)  SpO2: 96%  Weight: 100 lb (45.4 kg)  Height: 5' (1.524 m)   Body mass index is 19.53 kg/m. Physical Exam  Constitutional:  Thin frail elderly in no acute distress   HENT:  Head: Normocephalic.  Mouth/Throat: Oropharynx is clear and moist. No oropharyngeal exudate.  HOH   Eyes: Conjunctivae and EOM are normal. Pupils are equal, round, and reactive to light. Right eye exhibits no discharge. Left eye exhibits no discharge. No scleral icterus.  Neck: Normal range of motion. No JVD present. No thyromegaly present.  Cardiovascular: Normal rate, regular rhythm, normal heart sounds and intact distal pulses.  Exam reveals no gallop and no friction rub.   No murmur heard. Pulmonary/Chest: Effort normal and breath sounds normal. No respiratory  distress. She has no wheezes. She has no rales.  Abdominal: Soft. Bowel sounds are normal. She exhibits no distension. There is no tenderness. There is no rebound and no guarding.  Genitourinary:  Genitourinary Comments: Foley catheter draining adequate amounts of amber urine   Musculoskeletal: She exhibits no tenderness or deformity.  Right leg limited ROM due to pain.  Lymphadenopathy:    She has no cervical adenopathy.  Neurological: She is alert.  Alert and oriented with some intermittent confusion.   Skin: Skin is warm and dry. No rash noted. No erythema. No pallor.  #1. Right thigh and lateral knee surgical incision staples dry, clean and intact.No signs of infection noted.   # 2. Left great toe dry gangrene   Psychiatric: She has a normal mood and affect.    Labs reviewed:  Recent Labs  02/22/17 1014  03/11/17 0344  03/12/17 0417 03/13/17 0522 03/15/17 0509  NA 125*  < > 133*  < > 136 133* 133*  K 2.9*  < > 2.9*  < > 3.8 4.7 4.6  CL 88*  < > 98*  --  100* 100* 99*  CO2 29  < > 27  --  25 23 25   GLUCOSE 153*  < > 106*  < > 160* 110* 97  BUN 12  < > 12  --  14 11 15     CREATININE 0.51  < > 0.55  --  0.62 0.52 0.48  CALCIUM 8.5*  < > 8.2*  --  8.0* 8.4* 8.5*  MG 1.7  --  1.5*  --   --   --   --   < > = values in this interval not displayed.  Recent Labs  03/11/17 0344 03/12/17 0417 03/15/17 0509  AST 27 32 26  ALT 14 14 8*  ALKPHOS 86 75 91  BILITOT 0.4 0.4 0.4  PROT 5.3* 5.1* 5.6*  ALBUMIN 2.2* 2.1* 2.0*    Recent Labs  03/10/17 0057  03/13/17 0522 03/14/17 0333 03/15/17 0509  WBC 8.9  < > 14.8* 12.4* 10.5  NEUTROABS 6.6  --   --   --   --   HGB 9.6*  < > 9.0* 8.8* 9.2*  HCT 28.9*  < > 27.9* 25.6* 27.3*  MCV 90.0  < > 89.1 88.9 88.9  PLT 386  < > 316 322 383  < > = values in this interval not displayed.   Assessment/Plan 1. Moderate protein-calorie malnutrition TP 5.6 ( 03/18/2017).Has Registered dietician consult for protein supplements. Continue to monitor CMP.    2. Hypoalbuminemia ALB 2.71 ( 03/18/2017). Continue on Prostat 30 mls twice daily x 30 days started 03/16/2017. Recheck CMP once supplement completed. Continue to follow up with Registered Dietician.    3. Anemia post -op  Hgb 9.5, HCT 28.8 ( 03/18/2017). Post-op  right femur IM nailing. Previous Hgb 9.2 ( 03/15/2017).continue to monitor CBC.     Family/ staff Communication: Reviewed plan of care with patient and facility Nurse supervisor  Labs/tests ordered: None   03/20/2017, NP

## 2017-03-25 ENCOUNTER — Ambulatory Visit (INDEPENDENT_AMBULATORY_CARE_PROVIDER_SITE_OTHER): Payer: No Typology Code available for payment source

## 2017-03-25 ENCOUNTER — Ambulatory Visit (INDEPENDENT_AMBULATORY_CARE_PROVIDER_SITE_OTHER): Payer: Medicare Other | Admitting: Orthopaedic Surgery

## 2017-03-25 DIAGNOSIS — S72141D Displaced intertrochanteric fracture of right femur, subsequent encounter for closed fracture with routine healing: Secondary | ICD-10-CM

## 2017-03-25 NOTE — Progress Notes (Signed)
Kayla Collier is 2 weeks status post IM nail of a right subtrochanteric femur fracture. She is ambulating a wheelchair. She is at a skilled nursing facility. She is working with physical therapy. Hip range of motion is not overly painful. The incisions are healed. The staples were removed. Continue with physical therapy for aggressive mobilization and strengthening. Weight-bear as tolerated. Follow-up with me in 4 weeks with 2 view x-rays of the right hip.

## 2017-03-26 DIAGNOSIS — M25551 Pain in right hip: Secondary | ICD-10-CM | POA: Diagnosis not present

## 2017-03-30 DIAGNOSIS — M25551 Pain in right hip: Secondary | ICD-10-CM | POA: Diagnosis not present

## 2017-03-31 DIAGNOSIS — M25551 Pain in right hip: Secondary | ICD-10-CM | POA: Diagnosis not present

## 2017-04-02 ENCOUNTER — Other Ambulatory Visit: Payer: Self-pay

## 2017-04-02 DIAGNOSIS — I70262 Atherosclerosis of native arteries of extremities with gangrene, left leg: Secondary | ICD-10-CM

## 2017-04-02 DIAGNOSIS — M25551 Pain in right hip: Secondary | ICD-10-CM | POA: Diagnosis not present

## 2017-04-05 ENCOUNTER — Encounter: Payer: Self-pay | Admitting: Vascular Surgery

## 2017-04-05 DIAGNOSIS — M25551 Pain in right hip: Secondary | ICD-10-CM | POA: Diagnosis not present

## 2017-04-05 DIAGNOSIS — B373 Candidiasis of vulva and vagina: Secondary | ICD-10-CM | POA: Diagnosis not present

## 2017-04-05 DIAGNOSIS — I10 Essential (primary) hypertension: Secondary | ICD-10-CM | POA: Diagnosis not present

## 2017-04-05 DIAGNOSIS — D649 Anemia, unspecified: Secondary | ICD-10-CM | POA: Diagnosis not present

## 2017-04-05 DIAGNOSIS — R300 Dysuria: Secondary | ICD-10-CM | POA: Diagnosis not present

## 2017-04-08 ENCOUNTER — Encounter: Payer: Self-pay | Admitting: Vascular Surgery

## 2017-04-08 ENCOUNTER — Ambulatory Visit (INDEPENDENT_AMBULATORY_CARE_PROVIDER_SITE_OTHER)
Admit: 2017-04-08 | Discharge: 2017-04-08 | Disposition: A | Discharge status: Home / Self Care | Payer: Medicare Other | Source: Ambulatory Visit | Attending: Vascular Surgery | Admitting: Vascular Surgery

## 2017-04-08 ENCOUNTER — Encounter: Payer: Self-pay | Admitting: *Deleted

## 2017-04-08 ENCOUNTER — Other Ambulatory Visit: Payer: Self-pay | Admitting: *Deleted

## 2017-04-08 ENCOUNTER — Ambulatory Visit (INDEPENDENT_AMBULATORY_CARE_PROVIDER_SITE_OTHER): Payer: Medicare Other | Admitting: Vascular Surgery

## 2017-04-08 VITALS — BP 177/76 | HR 69 | Temp 98.4°F | Resp 16 | Ht 60.0 in | Wt 100.0 lb

## 2017-04-08 DIAGNOSIS — R4020 Unspecified coma: Secondary | ICD-10-CM | POA: Diagnosis not present

## 2017-04-08 DIAGNOSIS — R9431 Abnormal electrocardiogram [ECG] [EKG]: Secondary | ICD-10-CM | POA: Diagnosis not present

## 2017-04-08 DIAGNOSIS — R319 Hematuria, unspecified: Secondary | ICD-10-CM | POA: Diagnosis not present

## 2017-04-08 DIAGNOSIS — I70262 Atherosclerosis of native arteries of extremities with gangrene, left leg: Secondary | ICD-10-CM

## 2017-04-08 DIAGNOSIS — Z515 Encounter for palliative care: Secondary | ICD-10-CM | POA: Diagnosis not present

## 2017-04-08 DIAGNOSIS — I10 Essential (primary) hypertension: Secondary | ICD-10-CM

## 2017-04-08 DIAGNOSIS — L8990 Pressure ulcer of unspecified site, unspecified stage: Secondary | ICD-10-CM | POA: Diagnosis not present

## 2017-04-08 DIAGNOSIS — J9601 Acute respiratory failure with hypoxia: Secondary | ICD-10-CM | POA: Diagnosis not present

## 2017-04-08 DIAGNOSIS — I739 Peripheral vascular disease, unspecified: Secondary | ICD-10-CM

## 2017-04-08 DIAGNOSIS — I517 Cardiomegaly: Secondary | ICD-10-CM | POA: Diagnosis not present

## 2017-04-08 DIAGNOSIS — N390 Urinary tract infection, site not specified: Secondary | ICD-10-CM | POA: Diagnosis not present

## 2017-04-08 DIAGNOSIS — A419 Sepsis, unspecified organism: Secondary | ICD-10-CM | POA: Diagnosis not present

## 2017-04-08 DIAGNOSIS — G9340 Encephalopathy, unspecified: Secondary | ICD-10-CM | POA: Diagnosis not present

## 2017-04-08 DIAGNOSIS — R4182 Altered mental status, unspecified: Secondary | ICD-10-CM | POA: Diagnosis not present

## 2017-04-08 NOTE — Progress Notes (Signed)
Patient is a 83 year old female recently seen as an in-hospital consult 03/10/2017. At that time she had dry gangrene of her left first toe. She had fallen and broken her right hip and had repair of this by orthopedics. I discussed with the patient and the family at the time of hospital admission that the dry gangrenous first toe was not necessary to be emergently treated. The patient does have some level of dementia and is a poor historian. Apparently they have been offered arteriogram by Dr. Wyn Quaker in [place] in the past.  She is currently on Lovenox and aspirin.  She continues to have pain in the left foot. She is taking tramadol 1 tablet every 6 hours her pain. However, she still has trouble sleeping at night and frequently complains about the left foot and draws the left leg secondary to pain.  Past Medical History:  Diagnosis Date  . Anxiety   . Arthritis    RA  . Closed fracture head of femur (HCC) 03/10/2017  . Gangrene (HCC)   . Hypertension   . Seizures (HCC)     Past Surgical History:  Procedure Laterality Date  . CATARACT EXTRACTION    . INTRAMEDULLARY (IM) NAIL INTERTROCHANTERIC Right 03/11/2017   Procedure: RIGHT INTRAMEDULLARY (IM) NAIL INTERTROCHANTERIC;  Surgeon: Tarry Kos, MD;  Location: [facility] OR;  Service: Orthopedics;  Laterality: Right;  . TOTAL HIP ARTHROPLASTY      Current Outpatient Prescriptions on File Prior to Visit  Medication Sig Dispense Refill  . acetaminophen (TYLENOL) 500 MG tablet Take 1,000 mg by mouth 2 (two) times daily.    . Amino Acids-Protein Hydrolys (FEEDING SUPPLEMENT, PRO-STAT SUGAR FREE 64,) LIQD Take 30 mLs by mouth 2 (two) times daily. Stop date 04/16/17    . amLODipine (NORVASC) 5 MG tablet Take 5 mg by mouth daily.    Marland Kitchen amoxicillin-clavulanate (AUGMENTIN) 875-125 MG tablet Take 1 tablet by mouth 2 (two) times daily. Stop date 03/25/17    . aspirin EC 81 MG tablet Take 81 mg by mouth daily.    Marland Kitchen atenolol (TENORMIN) 50 MG tablet Take 50 mg by  mouth daily.    . cholecalciferol (VITAMIN D) 1000 units tablet Take 1,000 Units by mouth daily.    Marland Kitchen docusate sodium (COLACE) 100 MG capsule Take 1 capsule (100 mg total) by mouth 2 (two) times daily. 10 capsule 0  . enoxaparin (LOVENOX) 40 MG/0.4ML injection Inject 0.4 mLs (40 mg total) into the skin daily. 14 Syringe 0  . gabapentin (NEURONTIN) 100 MG capsule Take 100 mg by mouth 3 (three) times daily.    . methocarbamol (ROBAXIN) 500 MG tablet Take 1 tablet (500 mg total) by mouth 4 (four) times daily. 30 tablet 1  . Misc Natural Products (TART CHERRY ADVANCED) CAPS Take 1 capsule by mouth daily.    . phenytoin (DILANTIN) 100 MG ER capsule Take 100 mg by mouth at bedtime.     . phenytoin (DILANTIN) 30 MG ER capsule Take 30 mg by mouth 3 (three) times a week. Take at bedtime with 100mg  on Monday, Wednesday, and Friday    . polyethylene glycol (MIRALAX / GLYCOLAX) packet Take 17 g by mouth daily as needed for mild constipation. 14 each 0  . potassium chloride (MICRO-K) 10 MEQ CR capsule Take 10 mEq by mouth daily.     . traMADol (ULTRAM) 50 MG tablet Take 1 tablet (50 mg total) by mouth every 6 (six) hours as needed. 30 tablet 0  . UNABLE TO  FIND Med Name: Med pass 120 mL 2 times daily.    . vitamin B-12 (CYANOCOBALAMIN) 1000 MCG tablet Take 1,000 mcg by mouth daily.    . vitamin E (VITAMIN E) 400 UNIT capsule Take 400 Units by mouth daily.     No current facility-administered medications on file prior to visit.     No Known Allergies   Physical exam:  Vitals:   04/08/17 1426  BP: (!) 177/76  Pulse: 69  Resp: 16  Temp: 98.4 F (36.9 C)  TempSrc: Oral  SpO2: 98%  Weight: 100 lb (45.4 kg)  Height: 5' (1.524 m)    Extremities: Dry gangrene left first toe unchanged from hospitalization  Data: Patient had bilateral ABIs performed today right side was 0.29 left side 0.36 monophasic waveforms  Assessment: Dry gangrene left first toe no evidence of healing currently rest pain left  foot. Patient has ABIs suggestive of severe peripheral arterial disease both lower extremities. I had a lengthy discussion with the patient and her daughter today and discussed the options of left above-knee amputation versus continued management with pain medication versus arteriogram and possibly an intervention to improve blood flow in her foot. Also discussed with them the possibility of his disease and there may not be an intervention possible to improve flow to her left foot. If this is the case and the only option would be to consider an above-knee amputation. However Y mention this to the patient and her daughter. The patient's daughter stated that the patient would rather die than have an amputation of her leg. If we are unable to perform an intervention to successfully revascularize her left lower extremity in the only option would probably be pain medication and palliation. I do not believe simply amputating the patient's left first toe is going to heal due to the poor flow to the left foot.  Plan: Aortogram lower extremity runoff possible intervention scheduled for 04/30/2017. Risks benefits possible complications and procedure details including but not limited to bleeding infection vessel injury contrast reaction were discussed with the patient and her daughter today. They wish to proceed.  Fabienne Bruns, MD [facility] Office: [phone] Pager: [phone]

## 2017-04-09 ENCOUNTER — Encounter: Payer: Self-pay | Admitting: *Deleted

## 2017-04-09 ENCOUNTER — Ambulatory Visit (HOSPITAL_COMMUNITY)
Admission: RE | Admit: 2017-04-09 | Discharge: 2017-04-09 | Disposition: A | Discharge status: Home / Self Care | Payer: Medicare Other | Source: Ambulatory Visit | Attending: Vascular Surgery | Admitting: Vascular Surgery

## 2017-04-09 ENCOUNTER — Encounter (HOSPITAL_COMMUNITY)
Admission: RE | Disposition: A | Discharge status: Home / Self Care | Payer: Self-pay | Source: Ambulatory Visit | Attending: Vascular Surgery

## 2017-04-09 DIAGNOSIS — I10 Essential (primary) hypertension: Secondary | ICD-10-CM

## 2017-04-09 DIAGNOSIS — Z7982 Long term (current) use of aspirin: Secondary | ICD-10-CM

## 2017-04-09 DIAGNOSIS — F0280 Dementia in other diseases classified elsewhere without behavioral disturbance: Secondary | ICD-10-CM | POA: Diagnosis present

## 2017-04-09 DIAGNOSIS — G9340 Encephalopathy, unspecified: Secondary | ICD-10-CM | POA: Diagnosis present

## 2017-04-09 DIAGNOSIS — Z96641 Presence of right artificial hip joint: Secondary | ICD-10-CM

## 2017-04-09 DIAGNOSIS — F0390 Unspecified dementia without behavioral disturbance: Secondary | ICD-10-CM | POA: Insufficient documentation

## 2017-04-09 DIAGNOSIS — Z79899 Other long term (current) drug therapy: Secondary | ICD-10-CM

## 2017-04-09 DIAGNOSIS — M069 Rheumatoid arthritis, unspecified: Secondary | ICD-10-CM | POA: Insufficient documentation

## 2017-04-09 DIAGNOSIS — Z515 Encounter for palliative care: Secondary | ICD-10-CM | POA: Diagnosis present

## 2017-04-09 DIAGNOSIS — Z66 Do not resuscitate: Secondary | ICD-10-CM

## 2017-04-09 DIAGNOSIS — J9601 Acute respiratory failure with hypoxia: Secondary | ICD-10-CM | POA: Diagnosis present

## 2017-04-09 DIAGNOSIS — F419 Anxiety disorder, unspecified: Secondary | ICD-10-CM | POA: Diagnosis present

## 2017-04-09 DIAGNOSIS — I70262 Atherosclerosis of native arteries of extremities with gangrene, left leg: Secondary | ICD-10-CM

## 2017-04-09 DIAGNOSIS — Z7901 Long term (current) use of anticoagulants: Secondary | ICD-10-CM | POA: Insufficient documentation

## 2017-04-09 DIAGNOSIS — I70263 Atherosclerosis of native arteries of extremities with gangrene, bilateral legs: Secondary | ICD-10-CM

## 2017-04-09 DIAGNOSIS — D649 Anemia, unspecified: Secondary | ICD-10-CM | POA: Diagnosis present

## 2017-04-09 DIAGNOSIS — A419 Sepsis, unspecified organism: Principal | ICD-10-CM | POA: Diagnosis present

## 2017-04-09 DIAGNOSIS — F0150 Vascular dementia without behavioral disturbance: Secondary | ICD-10-CM | POA: Diagnosis present

## 2017-04-09 DIAGNOSIS — L8990 Pressure ulcer of unspecified site, unspecified stage: Secondary | ICD-10-CM | POA: Diagnosis present

## 2017-04-09 DIAGNOSIS — E876 Hypokalemia: Secondary | ICD-10-CM | POA: Diagnosis present

## 2017-04-09 DIAGNOSIS — G309 Alzheimer's disease, unspecified: Secondary | ICD-10-CM | POA: Diagnosis present

## 2017-04-09 HISTORY — PX: ABDOMINAL AORTOGRAM W/LOWER EXTREMITY: CATH118223

## 2017-04-09 LAB — POCT I-STAT, CHEM 8
BUN: 25 mg/dL — AB (ref 6–20)
CREATININE: 0.4 mg/dL — AB (ref 0.44–1.00)
Calcium, Ion: 1.16 mmol/L (ref 1.15–1.40)
Chloride: 98 mmol/L — ABNORMAL LOW (ref 101–111)
GLUCOSE: 99 mg/dL (ref 65–99)
HCT: 36 % (ref 36.0–46.0)
HEMOGLOBIN: 12.2 g/dL (ref 12.0–15.0)
Potassium: 3.7 mmol/L (ref 3.5–5.1)
Sodium: 135 mmol/L (ref 135–145)
TCO2: 30 mmol/L (ref 0–100)

## 2017-04-09 SURGERY — ABDOMINAL AORTOGRAM W/LOWER EXTREMITY
Anesthesia: LOCAL

## 2017-04-09 MED ORDER — HYDRALAZINE HCL 20 MG/ML IJ SOLN
INTRAMUSCULAR | Status: DC | PRN
  Administered 2017-04-09 (×2): 5 mg via INTRAVENOUS

## 2017-04-09 MED ORDER — LIDOCAINE HCL (PF) 1 % IJ SOLN
INTRAMUSCULAR | Status: DC | PRN
  Administered 2017-04-09: 15 mL

## 2017-04-09 MED ORDER — HYDRALAZINE HCL 20 MG/ML IJ SOLN
INTRAMUSCULAR | Status: AC
  Filled 2017-04-09: qty 1

## 2017-04-09 MED ORDER — HYDRALAZINE HCL 20 MG/ML IJ SOLN
5.0000 mg | INTRAMUSCULAR | Status: DC | PRN
  Administered 2017-04-09: 5 mg via INTRAVENOUS

## 2017-04-09 MED ORDER — SODIUM CHLORIDE 0.45 % IV SOLN
INTRAVENOUS | Status: DC
  Administered 2017-04-09: 09:00:00 via INTRAVENOUS

## 2017-04-09 MED ORDER — ACETAMINOPHEN 325 MG PO TABS: 325.0000 mg | ORAL_TABLET | ORAL | Status: DC | PRN

## 2017-04-09 MED ORDER — HEPARIN (PORCINE) IN NACL 2-0.9 UNIT/ML-% IJ SOLN
INTRAMUSCULAR | Status: AC | PRN
  Administered 2017-04-09: 1000 mL

## 2017-04-09 MED ORDER — IODIXANOL 320 MG/ML IV SOLN
INTRAVENOUS | Status: DC | PRN
  Administered 2017-04-09: 97 mL via INTRA_ARTERIAL

## 2017-04-09 MED ORDER — LABETALOL HCL 5 MG/ML IV SOLN
INTRAVENOUS | Status: AC
  Administered 2017-04-09: 10 mg via INTRAVENOUS
  Filled 2017-04-09: qty 4

## 2017-04-09 MED ORDER — LIDOCAINE HCL 1 % IJ SOLN
INTRAMUSCULAR | Status: AC
  Filled 2017-04-09: qty 20

## 2017-04-09 MED ORDER — METOPROLOL TARTRATE 5 MG/5ML IV SOLN
2.0000 mg | INTRAVENOUS | Status: DC | PRN
Start: 2017-04-09 — End: 2017-04-09

## 2017-04-09 MED ORDER — TRAMADOL HCL 50 MG PO TABS
50.0000 mg | ORAL_TABLET | Freq: Four times a day (QID) | ORAL | Status: DC | PRN
  Administered 2017-04-09: 50 mg via ORAL

## 2017-04-09 MED ORDER — TRAMADOL HCL 50 MG PO TABS
ORAL_TABLET | ORAL | Status: AC
  Administered 2017-04-09: 50 mg via ORAL
  Filled 2017-04-09: qty 1

## 2017-04-09 MED ORDER — LABETALOL HCL 5 MG/ML IV SOLN
10.0000 mg | INTRAVENOUS | Status: DC | PRN
  Administered 2017-04-09: 10 mg via INTRAVENOUS

## 2017-04-09 MED ORDER — ONDANSETRON HCL 4 MG/2ML IJ SOLN: 4.0000 mg | Freq: Four times a day (QID) | INTRAMUSCULAR | Status: DC | PRN

## 2017-04-09 MED ORDER — HEPARIN (PORCINE) IN NACL 2-0.9 UNIT/ML-% IJ SOLN
INTRAMUSCULAR | Status: AC
  Filled 2017-04-09: qty 1000

## 2017-04-09 MED ORDER — SODIUM CHLORIDE 0.9 % IV SOLN
INTRAVENOUS | Status: DC
  Administered 2017-04-09: 07:00:00 via INTRAVENOUS

## 2017-04-09 MED ORDER — ACETAMINOPHEN 325 MG RE SUPP: 325.0000 mg | RECTAL | Status: DC | PRN

## 2017-04-09 SURGICAL SUPPLY — 9 items
CATH ANGIO 5F PIGTAIL 65CM (CATHETERS) ×2 IMPLANT
COVER PRB 48X5XTLSCP FOLD TPE (BAG) ×1 IMPLANT
COVER PROBE 5X48 (BAG) ×1
KIT PV (KITS) ×2 IMPLANT
SHEATH PINNACLE 5F 10CM (SHEATH) ×2 IMPLANT
SYR MEDRAD MARK V 150ML (SYRINGE) ×2 IMPLANT
TRANSDUCER W/STOPCOCK (MISCELLANEOUS) ×2 IMPLANT
TRAY PV CATH (CUSTOM PROCEDURE TRAY) ×2 IMPLANT
WIRE HITORQ VERSACORE ST 145CM (WIRE) ×2 IMPLANT

## 2017-04-09 NOTE — Progress Notes (Signed)
Patient discharged with transportation to rehab with her daughter at her side. Discharge discussed with her daughter and she said she would hand them off to facility.

## 2017-04-09 NOTE — Progress Notes (Signed)
All information obtained by speaking with [facility] nursing staff and pt's daughter and Roselee Culver, cell [phone].

## 2017-04-09 NOTE — Progress Notes (Signed)
Site area: rt groin fa sheath Site Prior to Removal:  Level 0 Pressure Applied For:  20 minutes Manual:   yes Patient Status During Pull:  stable Post Pull Site:  Level  0 Post Pull Instructions Given:  *patient has dementia Post Pull Pulses Present: dopplered Dressing Applied:  Gauze and tegaderm Bedrest begins @ 1005 Comments:

## 2017-04-09 NOTE — H&P (View-Only) (Signed)
Patient is a 83-year-old female recently seen as an in-hospital consult 03/10/2017. At that time she had dry gangrene of her left first toe. She had fallen and broken her right hip and had repair of this by orthopedics. I discussed with the patient and the family at the time of hospital admission that the dry gangrenous first toe was not necessary to be emergently treated. The patient does have some level of dementia and is a poor historian. Apparently they have been offered arteriogram by Dr. Dew in [place] in the past.  She is currently on Lovenox and aspirin.  She continues to have pain in the left foot. She is taking tramadol 1 tablet every 6 hours her pain. However, she still has trouble sleeping at night and frequently complains about the left foot and draws the left leg secondary to pain.  Past Medical History:  Diagnosis Date  . Anxiety   . Arthritis    RA  . Closed fracture head of femur (HCC) 03/10/2017  . Gangrene (HCC)   . Hypertension   . Seizures (HCC)     Past Surgical History:  Procedure Laterality Date  . CATARACT EXTRACTION    . INTRAMEDULLARY (IM) NAIL INTERTROCHANTERIC Right 03/11/2017   Procedure: RIGHT INTRAMEDULLARY (IM) NAIL INTERTROCHANTERIC;  Surgeon: Xu, Naiping M, MD;  Location: [facility] OR;  Service: Orthopedics;  Laterality: Right;  . TOTAL HIP ARTHROPLASTY      Current Outpatient Prescriptions on File Prior to Visit  Medication Sig Dispense Refill  . acetaminophen (TYLENOL) 500 MG tablet Take 1,000 mg by mouth 2 (two) times daily.    . Amino Acids-Protein Hydrolys (FEEDING SUPPLEMENT, PRO-STAT SUGAR FREE 64,) LIQD Take 30 mLs by mouth 2 (two) times daily. Stop date 04/16/17    . amLODipine (NORVASC) 5 MG tablet Take 5 mg by mouth daily.    . amoxicillin-clavulanate (AUGMENTIN) 875-125 MG tablet Take 1 tablet by mouth 2 (two) times daily. Stop date 03/25/17    . aspirin EC 81 MG tablet Take 81 mg by mouth daily.    . atenolol (TENORMIN) 50 MG tablet Take 50 mg by  mouth daily.    . cholecalciferol (VITAMIN D) 1000 units tablet Take 1,000 Units by mouth daily.    . docusate sodium (COLACE) 100 MG capsule Take 1 capsule (100 mg total) by mouth 2 (two) times daily. 10 capsule 0  . enoxaparin (LOVENOX) 40 MG/0.4ML injection Inject 0.4 mLs (40 mg total) into the skin daily. 14 Syringe 0  . gabapentin (NEURONTIN) 100 MG capsule Take 100 mg by mouth 3 (three) times daily.    . methocarbamol (ROBAXIN) 500 MG tablet Take 1 tablet (500 mg total) by mouth 4 (four) times daily. 30 tablet 1  . Misc Natural Products (TART CHERRY ADVANCED) CAPS Take 1 capsule by mouth daily.    . phenytoin (DILANTIN) 100 MG ER capsule Take 100 mg by mouth at bedtime.     . phenytoin (DILANTIN) 30 MG ER capsule Take 30 mg by mouth 3 (three) times a week. Take at bedtime with 100mg on Monday, Wednesday, and Friday    . polyethylene glycol (MIRALAX / GLYCOLAX) packet Take 17 g by mouth daily as needed for mild constipation. 14 each 0  . potassium chloride (MICRO-K) 10 MEQ CR capsule Take 10 mEq by mouth daily.     . traMADol (ULTRAM) 50 MG tablet Take 1 tablet (50 mg total) by mouth every 6 (six) hours as needed. 30 tablet 0  . UNABLE TO   FIND Med Name: Med pass 120 mL 2 times daily.    . vitamin B-12 (CYANOCOBALAMIN) 1000 MCG tablet Take 1,000 mcg by mouth daily.    . vitamin E (VITAMIN E) 400 UNIT capsule Take 400 Units by mouth daily.     No current facility-administered medications on file prior to visit.     No Known Allergies   Physical exam:  Vitals:   04/08/17 1426  BP: (!) 177/76  Pulse: 69  Resp: 16  Temp: 98.4 F (36.9 C)  TempSrc: Oral  SpO2: 98%  Weight: 100 lb (45.4 kg)  Height: 5' (1.524 m)    Extremities: Dry gangrene left first toe unchanged from hospitalization  Data: Patient had bilateral ABIs performed today right side was 0.29 left side 0.36 monophasic waveforms  Assessment: Dry gangrene left first toe no evidence of healing currently rest pain left  foot. Patient has ABIs suggestive of severe peripheral arterial disease both lower extremities. I had a lengthy discussion with the patient and her daughter today and discussed the options of left above-knee amputation versus continued management with pain medication versus arteriogram and possibly an intervention to improve blood flow in her foot. Also discussed with them the possibility of his disease and there may not be an intervention possible to improve flow to her left foot. If this is the case and the only option would be to consider an above-knee amputation. However Y mention this to the patient and her daughter. The patient's daughter stated that the patient would rather die than have an amputation of her leg. If we are unable to perform an intervention to successfully revascularize her left lower extremity in the only option would probably be pain medication and palliation. I do not believe simply amputating the patient's left first toe is going to heal due to the poor flow to the left foot.  Plan: Aortogram lower extremity runoff possible intervention scheduled for 04/30/2017. Risks benefits possible complications and procedure details including but not limited to bleeding infection vessel injury contrast reaction were discussed with the patient and her daughter today. They wish to proceed.  Fabienne Bruns, MD [facility] Office: [phone] Pager: [phone]

## 2017-04-09 NOTE — Progress Notes (Signed)
  [name], Kayla Collier, 83 y.o., December 11, 1917 Weight:  97 lb (44 kg) Phone:  [phone] PCP:  Ileana Ladd, MD MRN:  [id number] MyChart:  Declined Next Appt:  04/22/2017   Message  Received: Today  Message Contents  Sherren Kerns, MD sent to P [facility] Charge Pool        [place] right groin  Aortogram with bilat runoff  Unreconstructable   She can follow up PRN only if family wishes [facility]   Kayla Collier        Documentation from staff message;  PRN for [facility] only, patient has unreconstructable vessels.

## 2017-04-09 NOTE — Interval H&P Note (Signed)
History and Physical Interval Note:  04/09/2017 8:11 AM  Kayla Collier  has presented today for surgery, with the diagnosis of pad  The various methods of treatment have been discussed with the patient and family. After consideration of risks, benefits and other options for treatment, the patient has consented to  Procedure(s): Abdominal Aortogram w/Lower Extremity (N/A) as a surgical intervention .  The patient's history has been reviewed, patient examined, no change in status, stable for surgery.  I have reviewed the patient's chart and labs.  Questions were answered to the patient's satisfaction.     Fabienne Bruns

## 2017-04-09 NOTE — Discharge Instructions (Signed)

## 2017-04-09 NOTE — Op Note (Addendum)
Procedure: Abdominal aortogram with bilateral lower extremity runoff  Preoperative diagnosis: Gangrene left foot. 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 The patient was placed in supine position the Angio table. Both groins were prepped and draped in usual sterile fashion. Local anesthesia was inserted over the right common femoral artery. Ultrasound was used to identify the right common femoral artery in an introducer needle was used to cannulate the right common femoral artery without difficulty. An 035 versacore wire was then threaded up the abdominal aorta under fluoroscopic guidance. A 5 French sheath was then placed over the guidewire the right common femoral artery. This was thoroughly flushed with heparin saline. A 5 French pigtail catheter was advanced over the guidewire into the abdominal aorta. An abdominal aortogram was then obtained under advanced up into catheter into the abdominal aorta just below the level of the L1 vertebral body. Left and right renal arteries are patent. The infrarenal abdominal aorta patent. The left and right common external and internal iliac arteries are widely patent. Next the catheter was pulled down just above the aortic bifurcation. Bilateral extremity runoff views were obtained.  In the left lower extremity, the left common femoral profunda femoris and superficial femoral arteries are patent. The above-knee popliteal artery occludes throughout its course. The below-knee popliteal artery is occluded. The origins of all 3 tibial vessels are occluded. Intermittently there are segments of the peroneal artery which reconstitutes there is essentially no runoff vessel to the left foot.  In the right lower extremity, there are similar findings although  there is a segment of peroneal artery in the distal leg but does reconstitute via collaterals that fills the right foot. At this point the apical catheter was removed over guidewire. The 5 French sheath was left in place to be pulled in the holding area. The patient tolerated the procedure well and there were no complications. The patient was taken the holding area in stable condition.  Operative management: The patient has unreconstructable popliteal and tibial artery occlusive disease bilaterally. She is not a candidate for any percutaneous or open operative intervention other than consideration for a left above-knee amputation. I will discuss this further with the patient's daughter.  Fabienne Bruns, MD [facility] Office: [phone] Pager: [phone]

## 2017-04-11 ENCOUNTER — Inpatient Hospital Stay (HOSPITAL_COMMUNITY)
Admission: EM | Admit: 2017-04-11 | Discharge: 2017-04-13 | DRG: 871 | Payer: Medicare Other | Attending: Internal Medicine | Admitting: Internal Medicine

## 2017-04-11 ENCOUNTER — Encounter (HOSPITAL_COMMUNITY): Payer: Self-pay | Admitting: Emergency Medicine

## 2017-04-11 ENCOUNTER — Emergency Department (HOSPITAL_COMMUNITY): Payer: Medicare Other

## 2017-04-11 DIAGNOSIS — J9600 Acute respiratory failure, unspecified whether with hypoxia or hypercapnia: Secondary | ICD-10-CM | POA: Diagnosis present

## 2017-04-11 DIAGNOSIS — R319 Hematuria, unspecified: Secondary | ICD-10-CM

## 2017-04-11 DIAGNOSIS — D649 Anemia, unspecified: Secondary | ICD-10-CM | POA: Diagnosis present

## 2017-04-11 DIAGNOSIS — R4020 Unspecified coma: Secondary | ICD-10-CM | POA: Diagnosis not present

## 2017-04-11 DIAGNOSIS — S728X9A Other fracture of unspecified femur, initial encounter for closed fracture: Secondary | ICD-10-CM | POA: Diagnosis not present

## 2017-04-11 DIAGNOSIS — S72101A Unspecified trochanteric fracture of right femur, initial encounter for closed fracture: Secondary | ICD-10-CM | POA: Diagnosis present

## 2017-04-11 DIAGNOSIS — G9340 Encephalopathy, unspecified: Secondary | ICD-10-CM | POA: Diagnosis not present

## 2017-04-11 DIAGNOSIS — N390 Urinary tract infection, site not specified: Secondary | ICD-10-CM | POA: Diagnosis not present

## 2017-04-11 DIAGNOSIS — A419 Sepsis, unspecified organism: Secondary | ICD-10-CM | POA: Diagnosis present

## 2017-04-11 DIAGNOSIS — Z79899 Other long term (current) drug therapy: Secondary | ICD-10-CM | POA: Diagnosis not present

## 2017-04-11 DIAGNOSIS — R402441 Other coma, without documented Glasgow coma scale score, or with partial score reported, in the field [EMT or ambulance]: Secondary | ICD-10-CM | POA: Diagnosis not present

## 2017-04-11 DIAGNOSIS — S79919A Unspecified injury of unspecified hip, initial encounter: Secondary | ICD-10-CM | POA: Diagnosis not present

## 2017-04-11 DIAGNOSIS — Z515 Encounter for palliative care: Secondary | ICD-10-CM | POA: Diagnosis not present

## 2017-04-11 DIAGNOSIS — I70262 Atherosclerosis of native arteries of extremities with gangrene, left leg: Secondary | ICD-10-CM | POA: Diagnosis not present

## 2017-04-11 DIAGNOSIS — I739 Peripheral vascular disease, unspecified: Secondary | ICD-10-CM | POA: Diagnosis not present

## 2017-04-11 DIAGNOSIS — G301 Alzheimer's disease with late onset: Secondary | ICD-10-CM | POA: Diagnosis not present

## 2017-04-11 DIAGNOSIS — Z7982 Long term (current) use of aspirin: Secondary | ICD-10-CM | POA: Diagnosis not present

## 2017-04-11 DIAGNOSIS — I10 Essential (primary) hypertension: Secondary | ICD-10-CM | POA: Diagnosis not present

## 2017-04-11 DIAGNOSIS — Z66 Do not resuscitate: Secondary | ICD-10-CM | POA: Diagnosis present

## 2017-04-11 DIAGNOSIS — F419 Anxiety disorder, unspecified: Secondary | ICD-10-CM | POA: Diagnosis present

## 2017-04-11 DIAGNOSIS — E876 Hypokalemia: Secondary | ICD-10-CM | POA: Diagnosis present

## 2017-04-11 DIAGNOSIS — N12 Tubulo-interstitial nephritis, not specified as acute or chronic: Secondary | ICD-10-CM | POA: Diagnosis present

## 2017-04-11 DIAGNOSIS — Z7189 Other specified counseling: Secondary | ICD-10-CM | POA: Diagnosis not present

## 2017-04-11 DIAGNOSIS — R569 Unspecified convulsions: Secondary | ICD-10-CM | POA: Diagnosis not present

## 2017-04-11 DIAGNOSIS — L8990 Pressure ulcer of unspecified site, unspecified stage: Secondary | ICD-10-CM | POA: Diagnosis present

## 2017-04-11 DIAGNOSIS — R9431 Abnormal electrocardiogram [ECG] [EKG]: Secondary | ICD-10-CM | POA: Diagnosis not present

## 2017-04-11 DIAGNOSIS — J9601 Acute respiratory failure with hypoxia: Secondary | ICD-10-CM | POA: Diagnosis not present

## 2017-04-11 DIAGNOSIS — F0150 Vascular dementia without behavioral disturbance: Secondary | ICD-10-CM | POA: Diagnosis present

## 2017-04-11 DIAGNOSIS — I517 Cardiomegaly: Secondary | ICD-10-CM | POA: Diagnosis not present

## 2017-04-11 DIAGNOSIS — R4182 Altered mental status, unspecified: Secondary | ICD-10-CM

## 2017-04-11 DIAGNOSIS — R339 Retention of urine, unspecified: Secondary | ICD-10-CM | POA: Diagnosis present

## 2017-04-11 DIAGNOSIS — I96 Gangrene, not elsewhere classified: Secondary | ICD-10-CM | POA: Diagnosis not present

## 2017-04-11 DIAGNOSIS — G309 Alzheimer's disease, unspecified: Secondary | ICD-10-CM | POA: Diagnosis present

## 2017-04-11 DIAGNOSIS — F0280 Dementia in other diseases classified elsewhere without behavioral disturbance: Secondary | ICD-10-CM

## 2017-04-11 HISTORY — DX: Urinary tract infection, site not specified: N39.0

## 2017-04-11 HISTORY — DX: Retention of urine, unspecified: R33.9

## 2017-04-11 LAB — RAPID URINE DRUG SCREEN, HOSP PERFORMED
AMPHETAMINES: NOT DETECTED
BARBITURATES: NOT DETECTED
BENZODIAZEPINES: NOT DETECTED
COCAINE: NOT DETECTED
Opiates: POSITIVE — AB
TETRAHYDROCANNABINOL: NOT DETECTED

## 2017-04-11 LAB — CBG MONITORING, ED: Glucose-Capillary: 131 mg/dL — ABNORMAL HIGH (ref 65–99)

## 2017-04-11 LAB — COMPREHENSIVE METABOLIC PANEL
ALK PHOS: 152 U/L — AB (ref 38–126)
ALT: 20 U/L (ref 14–54)
ANION GAP: 13 (ref 5–15)
AST: 35 U/L (ref 15–41)
Albumin: 2.7 g/dL — ABNORMAL LOW (ref 3.5–5.0)
BILIRUBIN TOTAL: 0.6 mg/dL (ref 0.3–1.2)
BUN: 23 mg/dL — ABNORMAL HIGH (ref 6–20)
CALCIUM: 9.3 mg/dL (ref 8.9–10.3)
CO2: 22 mmol/L (ref 22–32)
Chloride: 101 mmol/L (ref 101–111)
Creatinine, Ser: 0.74 mg/dL (ref 0.44–1.00)
GFR calc non Af Amer: >60 mL/min (ref >60)
Glucose, Bld: 157 mg/dL — ABNORMAL HIGH (ref 65–99)
Potassium: 3.6 mmol/L (ref 3.5–5.1)
Sodium: 136 mmol/L (ref 135–145)
TOTAL PROTEIN: 6.6 g/dL (ref 6.5–8.1)

## 2017-04-11 LAB — CBC
HCT: 28.8 % — ABNORMAL LOW (ref 36.0–46.0)
HEMOGLOBIN: 9.3 g/dL — AB (ref 12.0–15.0)
MCH: 29.7 pg (ref 26.0–34.0)
MCHC: 32.3 g/dL (ref 30.0–36.0)
MCV: 92 fL (ref 78.0–100.0)
Platelets: 392 10*3/uL (ref 150–400)
RBC: 3.13 MIL/uL — AB (ref 3.87–5.11)
RDW: 15.2 % (ref 11.5–15.5)
WBC: 14.8 10*3/uL — AB (ref 4.0–10.5)

## 2017-04-11 LAB — URINALYSIS, ROUTINE W REFLEX MICROSCOPIC
Bilirubin Urine: NEGATIVE
GLUCOSE, UA: NEGATIVE mg/dL
Ketones, ur: NEGATIVE mg/dL
Nitrite: POSITIVE — AB
PH: 5 (ref 5.0–8.0)
Protein, ur: 100 mg/dL — AB
SQUAMOUS EPITHELIAL / LPF: NONE SEEN
Specific Gravity, Urine: 1.026 (ref 1.005–1.030)

## 2017-04-11 LAB — LACTIC ACID, PLASMA
Lactic Acid, Venous: 1.2 mmol/L (ref 0.5–1.9)
Lactic Acid, Venous: 1.2 mmol/L (ref 0.5–1.9)

## 2017-04-11 LAB — MAGNESIUM: Magnesium: 1.7 mg/dL (ref 1.7–2.4)

## 2017-04-11 LAB — PHOSPHORUS: Phosphorus: 2.5 mg/dL (ref 2.5–4.6)

## 2017-04-11 LAB — MRSA PCR SCREENING: MRSA by PCR: NEGATIVE

## 2017-04-11 LAB — VITAMIN B12: VITAMIN B 12: 2393 pg/mL — AB (ref 180–914)

## 2017-04-11 LAB — PHENYTOIN LEVEL, TOTAL: PHENYTOIN LVL: 3.7 ug/mL — AB (ref 10.0–20.0)

## 2017-04-11 LAB — ETHANOL

## 2017-04-11 MED ORDER — DOCUSATE SODIUM 100 MG PO CAPS
100.0000 mg | ORAL_CAPSULE | Freq: Two times a day (BID) | ORAL | Status: DC
  Administered 2017-04-11: 100 mg via ORAL
  Filled 2017-04-11 (×4): qty 1

## 2017-04-11 MED ORDER — DEXTROSE 5 % IV SOLN
1.0000 g | Freq: Once | INTRAVENOUS | Status: AC
  Administered 2017-04-11: 1 g via INTRAVENOUS
  Filled 2017-04-11: qty 10

## 2017-04-11 MED ORDER — MORPHINE SULFATE 10 MG/5ML PO SOLN
2.5000 mg | ORAL | Status: DC | PRN
  Administered 2017-04-12: 2.5 mg via ORAL
  Filled 2017-04-11: qty 2

## 2017-04-11 MED ORDER — CLOTRIMAZOLE 10 MG MT TROC
10.0000 mg | Freq: Every day | OROMUCOSAL | Status: DC
  Administered 2017-04-11 (×2): 10 mg via ORAL
  Filled 2017-04-11 (×7): qty 1

## 2017-04-11 MED ORDER — MORPHINE SULFATE (CONCENTRATE) 10 MG/0.5ML PO SOLN: 2.5000 mg | ORAL | Status: DC | PRN

## 2017-04-11 MED ORDER — SODIUM CHLORIDE 0.9 % IV SOLN
INTRAVENOUS | Status: AC
  Administered 2017-04-11: 10:00:00 via INTRAVENOUS

## 2017-04-11 MED ORDER — MORPHINE SULFATE 10 MG/5ML PO SOLN: 2.5000 mg | ORAL | Status: DC | PRN

## 2017-04-11 MED ORDER — DEXTROSE 5 % IV SOLN
1.0000 g | INTRAVENOUS | Status: DC
  Administered 2017-04-12: 1 g via INTRAVENOUS
  Filled 2017-04-11: qty 10

## 2017-04-11 MED ORDER — ORAL CARE MOUTH RINSE
15.0000 mL | Freq: Two times a day (BID) | OROMUCOSAL | Status: DC
  Administered 2017-04-11 – 2017-04-13 (×3): 15 mL via OROMUCOSAL

## 2017-04-11 MED ORDER — ONDANSETRON HCL 4 MG PO TABS: 4.0000 mg | ORAL_TABLET | Freq: Four times a day (QID) | ORAL | Status: DC | PRN

## 2017-04-11 MED ORDER — VITAMIN E 180 MG (400 UNIT) PO CAPS
400.0000 [IU] | ORAL_CAPSULE | Freq: Every day | ORAL | Status: DC
  Filled 2017-04-11 (×3): qty 1

## 2017-04-11 MED ORDER — ALBUTEROL SULFATE (2.5 MG/3ML) 0.083% IN NEBU
2.5000 mg | INHALATION_SOLUTION | RESPIRATORY_TRACT | Status: DC | PRN
Start: 2017-04-11 — End: 2017-04-13

## 2017-04-11 MED ORDER — VITAMIN B-12 1000 MCG PO TABS
1000.0000 ug | ORAL_TABLET | Freq: Every day | ORAL | Status: DC
  Administered 2017-04-11: 1000 ug via ORAL
  Filled 2017-04-11: qty 1

## 2017-04-11 MED ORDER — SODIUM CHLORIDE 0.9% FLUSH
3.0000 mL | Freq: Two times a day (BID) | INTRAVENOUS | Status: DC
  Administered 2017-04-11 – 2017-04-13 (×4): 3 mL via INTRAVENOUS

## 2017-04-11 MED ORDER — POTASSIUM CHLORIDE CRYS ER 20 MEQ PO TBCR
10.0000 meq | EXTENDED_RELEASE_TABLET | Freq: Every day | ORAL | Status: DC
  Administered 2017-04-11: 10 meq via ORAL
  Filled 2017-04-11: qty 1

## 2017-04-11 MED ORDER — POLYETHYLENE GLYCOL 3350 17 G PO PACK
17.0000 g | PACK | Freq: Every day | ORAL | Status: DC | PRN
Start: 2017-04-11 — End: 2017-04-13

## 2017-04-11 MED ORDER — ENSURE ENLIVE PO LIQD
237.0000 mL | Freq: Two times a day (BID) | ORAL | Status: DC
  Administered 2017-04-11: 237 mL via ORAL

## 2017-04-11 MED ORDER — ONDANSETRON HCL 4 MG/2ML IJ SOLN: 4.0000 mg | Freq: Four times a day (QID) | INTRAMUSCULAR | Status: DC | PRN

## 2017-04-11 MED ORDER — PHENYTOIN SODIUM EXTENDED 30 MG PO CAPS
30.0000 mg | ORAL_CAPSULE | ORAL | Status: DC
  Filled 2017-04-11: qty 1

## 2017-04-11 MED ORDER — PHENYTOIN SODIUM EXTENDED 100 MG PO CAPS
100.0000 mg | ORAL_CAPSULE | Freq: Every day | ORAL | Status: DC
  Filled 2017-04-11: qty 1

## 2017-04-11 MED ORDER — NALOXONE HCL 0.4 MG/ML IJ SOLN
0.2000 mg | Freq: Once | INTRAMUSCULAR | Status: AC
  Administered 2017-04-11: 0.2 mg via INTRAVENOUS
  Filled 2017-04-11: qty 1

## 2017-04-11 MED ORDER — ATENOLOL 50 MG PO TABS
50.0000 mg | ORAL_TABLET | Freq: Every day | ORAL | Status: DC
  Administered 2017-04-11: 50 mg via ORAL
  Filled 2017-04-11: qty 1

## 2017-04-11 MED ORDER — ENOXAPARIN SODIUM 40 MG/0.4ML ~~LOC~~ SOLN
40.0000 mg | SUBCUTANEOUS | Status: DC
  Administered 2017-04-11 – 2017-04-12 (×2): 40 mg via SUBCUTANEOUS
  Filled 2017-04-11 (×2): qty 0.4

## 2017-04-11 MED ORDER — ACETAMINOPHEN 325 MG PO TABS
650.0000 mg | ORAL_TABLET | Freq: Four times a day (QID) | ORAL | Status: DC | PRN
  Administered 2017-04-11: 650 mg via ORAL
  Filled 2017-04-11: qty 2

## 2017-04-11 MED ORDER — VITAMIN D 1000 UNITS PO TABS
1000.0000 [IU] | ORAL_TABLET | Freq: Every day | ORAL | Status: DC
  Administered 2017-04-11: 1000 [IU] via ORAL
  Filled 2017-04-11: qty 1

## 2017-04-11 MED ORDER — ACETAMINOPHEN 160 MG/5ML PO SOLN
500.0000 mg | Freq: Three times a day (TID) | ORAL | Status: DC
  Administered 2017-04-11 – 2017-04-13 (×2): 500 mg via ORAL
  Filled 2017-04-11 (×5): qty 20.3

## 2017-04-11 MED ORDER — NALOXONE HCL 2 MG/2ML IJ SOSY
PREFILLED_SYRINGE | INTRAMUSCULAR | Status: AC
  Filled 2017-04-11: qty 2

## 2017-04-11 MED ORDER — ACETAMINOPHEN 650 MG RE SUPP
650.0000 mg | Freq: Four times a day (QID) | RECTAL | Status: DC | PRN
  Administered 2017-04-12: 650 mg via RECTAL
  Filled 2017-04-11: qty 1

## 2017-04-11 MED ORDER — ASPIRIN EC 81 MG PO TBEC
81.0000 mg | DELAYED_RELEASE_TABLET | Freq: Every day | ORAL | Status: DC
  Administered 2017-04-11: 81 mg via ORAL
  Filled 2017-04-11: qty 1

## 2017-04-11 NOTE — Progress Notes (Signed)
Patient too lethargic to safely take PO medication at this time.  MD notified.

## 2017-04-11 NOTE — Progress Notes (Signed)
In and out catheterization with  #5F,obtained 500cc  of dark amber urine. Patient tolerated it well.Will continue to monitor. Keaton Beichner, Drinda Butts, Charity fundraiser

## 2017-04-11 NOTE — H&P (Signed)
History and Physical    Kayla Collier MLY:[id number] DOB: 01/30/1918 DOA: 04/11/2017  PCP: Ileana Ladd, MD Patient coming from: facility  Chief Complaint: weakness/lethargy  HPI: Kayla Collier is a very pleasant 83 y.o. female with medical history significant for dementia, hypertension, seizures, gangrene of the left foot with atherosclerosis recently underwent aortogram with bilateral runoff per vascular presents to the emergency department from a facility with the chief complaint generalized weakness and lethargy. Initial evaluation reveals acute respiratory failure with hypoxia, urinary tract infection.  Information is obtained from the daughter who is at the bedside and the chart. Daughter reports facility called EMS when patient was found responsive around 1 AM this morning. Unknown facility when she was last seen normal. EMS reports she had a fixed gaze did not respond verbally but did withdraw from pain. At that time her oxygen saturation was noted to be 88% on room air. Patient was recently discharged from the hospital after arteriogram and then 2 weeks prior to that underwent repair of hip fracture. Caregiver reports patient has gradually become more lethargic has been getting pain medicine for the discomfort to her left foot. Daughter reports patient has a propensity for urinary retention and during her last hospitalization required a Foley catheter. No reports of any recent fever nausea vomiting diarrhea. Does report over the last 2 days her oral intake has declined. No complaints of chest pain palpitation shortness of breath. No complaints of abdominal pain abdominal distention.    ED Course: In the emergency department patient is afebrile hemodynamically stable with a blood pressure the high end of normal. Oxygen saturation level greater than 90% on 2 L nasal cannula.   Review of Systems: As per HPI otherwise all other systems reviewed and are negative.   Ambulatory Status:  Patient with recent repair of hip fracture undergoing physical therapy/rehabilitation at facility. Uses walker and wheelchair  Past Medical History:  Diagnosis Date  . Anxiety   . Arthritis    RA  . Closed fracture head of femur (HCC) 03/10/2017  . Gangrene (HCC)   . Hypertension   . Seizures (HCC)   . Urinary retention   . UTI (urinary tract infection)     Past Surgical History:  Procedure Laterality Date  . CATARACT EXTRACTION    . INTRAMEDULLARY (IM) NAIL INTERTROCHANTERIC Right 03/11/2017   Procedure: RIGHT INTRAMEDULLARY (IM) NAIL INTERTROCHANTERIC;  Surgeon: Tarry Kos, MD;  Location: [facility] OR;  Service: Orthopedics;  Laterality: Right;  . TOTAL HIP ARTHROPLASTY      Social History   Social History  . Marital status: Widowed    Spouse name: N/A  . Number of children: N/A  . Years of education: N/A   Occupational History  . Not on file.   Social History Main Topics  . Smoking status: Never Smoker  . Smokeless tobacco: Never Used  . Alcohol use No  . Drug use: No  . Sexual activity: Not on file   Other Topics Concern  . Not on file   Social History Narrative  . No narrative on file    No Known Allergies  Family History  Problem Relation Age of Onset  . Family history unknown: Yes    Prior to Admission medications   Medication Sig Start Date End Date Taking? Authorizing Provider  acetaminophen (TYLENOL) 500 MG tablet Take 1,000 mg by mouth 2 (two) times daily.    [provider]  Amino Acids-Protein Hydrolys (FEEDING SUPPLEMENT, PRO-STAT SUGAR FREE 64,) LIQD Take  30 mLs by mouth 2 (two) times daily. Stop date 04/16/17    [provider]  amLODipine (NORVASC) 5 MG tablet Take 5 mg by mouth daily.    [provider]  aspirin EC 81 MG tablet Take 81 mg by mouth daily.    [provider]  atenolol (TENORMIN) 50 MG tablet Take 50 mg by mouth daily.    [provider]  cholecalciferol (VITAMIN D) 1000 units tablet  Take 1,000 Units by mouth daily.    [provider]  docusate sodium (COLACE) 100 MG capsule Take 1 capsule (100 mg total) by mouth 2 (two) times daily. 03/15/17   Richarda Overlie, MD  enoxaparin (LOVENOX) 40 MG/0.4ML injection Inject 0.4 mLs (40 mg total) into the skin daily. 03/11/17   Tarry Kos, MD  gabapentin (NEURONTIN) 100 MG capsule Take 100 mg by mouth 3 (three) times daily.    [provider]  methocarbamol (ROBAXIN) 500 MG tablet Take 1 tablet (500 mg total) by mouth 4 (four) times daily. 03/15/17   Richarda Overlie, MD  Misc Natural Products [facility] ADVANCED) CAPS Take 1 capsule by mouth daily.    [provider]  phenytoin (DILANTIN) 100 MG ER capsule Take 100 mg by mouth at bedtime.     [provider]  phenytoin (DILANTIN) 30 MG ER capsule Take 30 mg by mouth 3 (three) times a week. Take at bedtime with 100mg  on Monday, Wednesday, and Friday    [provider]  polyethylene glycol (MIRALAX / GLYCOLAX) packet Take 17 g by mouth daily as needed for mild constipation. 03/15/17   Richarda Overlie, MD  potassium chloride (MICRO-K) 10 MEQ CR capsule Take 10 mEq by mouth daily.     [provider]  traMADol (ULTRAM) 50 MG tablet Take 1 tablet (50 mg total) by mouth every 6 (six) hours as needed. 03/11/17   Tarry Kos, MD  UNABLE TO FIND Med Name: Med pass 120 mL 2 times daily.    [provider]  vitamin B-12 (CYANOCOBALAMIN) 1000 MCG tablet Take 1,000 mcg by mouth daily.    [provider]  vitamin E (VITAMIN E) 400 UNIT capsule Take 400 Units by mouth daily.    [provider]    Physical Exam: Vitals:   04/11/17 0615 04/11/17 0630 04/11/17 0645 04/11/17 0700  BP: (!) 173/60 (!) 163/60 (!) 143/92 (!) 155/56  Pulse: 92 88 91 91  Resp: (!) 24 20 (!) 21 (!) 21  Temp:      TempSrc:      SpO2: 99% 98% 98% 100%     General:  Appears Thin and frail and comfortable. Opens eyes to verbal stimuli Eyes:  PERRL,  EOMI, normal lids, iris ENT:  grossly normal hearing, lips & tongue, mucous membranes of her mouth are pink slightly dry Neck:  no LAD, masses or thyromegaly Cardiovascular:  RRR, no m/r/g. No LE edema. Left great toe Kayla Collier. Other toes with mild erythema/swelling. Tender to touch Respiratory:  CTA bilaterally, no w/r/r. Normal respiratory effort. Abdomen:  soft, ntnd, NABS Skin:  no rash or induration seen on limited exam Musculoskeletal:  grossly normal tone BUE/BLE, good ROM, no bony abnormality Psychiatric:  grossly normal mood and affect, speech fluent and appropriate, AOx3 Neurologic: Somewhat lethargic opens eyes to verbal stimuli moves extremities spontaneously attempts to follow simple commands  Labs on Admission: I have personally reviewed following labs and imaging studies  CBC:  Recent Labs Lab 04/09/17 0740  04/11/17 0248  WBC  --  14.8*  HGB 12.2 9.3*  HCT 36.0 28.8*  MCV  --  92.0  PLT  --  392   Basic Metabolic Panel:  Recent Labs Lab 04/09/17 0740 04/11/17 0248  NA 135 136  K 3.7 3.6  CL 98* 101  CO2  --  22  GLUCOSE 99 157*  BUN 25* 23*  CREATININE 0.40* 0.74  CALCIUM  --  9.3   GFR: Estimated Creatinine Clearance: 27.4 mL/min (by C-G formula based on SCr of 0.74 mg/dL). Liver Function Tests:  Recent Labs Lab 04/11/17 0248  AST 35  ALT 20  ALKPHOS 152*  BILITOT 0.6  PROT 6.6  ALBUMIN 2.7*   No results for input(s): LIPASE, AMYLASE in the last 168 hours. No results for input(s): AMMONIA in the last 168 hours. Coagulation Profile: No results for input(s): INR, PROTIME in the last 168 hours. Cardiac Enzymes: No results for input(s): CKTOTAL, CKMB, CKMBINDEX, TROPONINI in the last 168 hours. BNP (last 3 results) No results for input(s): PROBNP in the last 8760 hours. HbA1C: No results for input(s): HGBA1C in the last 72 hours. CBG:  Recent Labs Lab 04/11/17 0243  GLUCAP 131*   Lipid Profile: No results for input(s): CHOL, HDL,  LDLCALC, TRIG, CHOLHDL, LDLDIRECT in the last 72 hours. Thyroid Function Tests: No results for input(s): TSH, T4TOTAL, FREET4, T3FREE, THYROIDAB in the last 72 hours. Anemia Panel: No results for input(s): VITAMINB12, FOLATE, FERRITIN, TIBC, IRON, RETICCTPCT in the last 72 hours. Urine analysis:    Component Value Date/Time   COLORURINE YELLOW 04/11/2017 0308   APPEARANCEUR TURBID (A) 04/11/2017 0308   LABSPEC 1.026 04/11/2017 0308   PHURINE 5.0 04/11/2017 0308   GLUCOSEU NEGATIVE 04/11/2017 0308   HGBUR SMALL (A) 04/11/2017 0308   BILIRUBINUR NEGATIVE 04/11/2017 0308   KETONESUR NEGATIVE 04/11/2017 0308   PROTEINUR 100 (A) 04/11/2017 0308   UROBILINOGEN 0.2 08/23/2007 1357   NITRITE POSITIVE (A) 04/11/2017 0308   LEUKOCYTESUR LARGE (A) 04/11/2017 0308    Creatinine Clearance: Estimated Creatinine Clearance: 27.4 mL/min (by C-G formula based on SCr of 0.74 mg/dL).  Sepsis Labs: @LABRCNTIP (procalcitonin:4,lacticidven:4) )No results found for this or any previous visit (from the past 240 hour(s)).   Radiological Exams on Admission: Dg Chest 2 View  Result Date: 04/11/2017 CLINICAL DATA:  Patient found unresponsive in bed. EXAM: CHEST  2 VIEW COMPARISON:  03/13/2017 FINDINGS: Stable mild cardiomegaly with aortic atherosclerosis. Left basilar atelectasis is noted. Interval clearing consolidation in the left lower lobe and probable small left effusion. No overt pulmonary edema. No acute osseous abnormality. IMPRESSION: No active cardiopulmonary disease. Stable cardiomegaly with aortic atherosclerosis. Left basilar atelectasis. Electronically Signed   By: 05/13/2017 M.D.   On: 04/11/2017 03:49   Ct Head Wo Contrast  Result Date: 04/11/2017 CLINICAL DATA:  Found unresponsive in bed. EXAM: CT HEAD WITHOUT CONTRAST TECHNIQUE: Contiguous axial images were obtained from the base of the skull through the vertex without intravenous contrast. COMPARISON:  03/10/2017 FINDINGS: Motion degraded  images limit assessment at the skullbase. BRAIN: There is chronic moderate sulcal and ventricular prominence consistent with superficial and central atrophy. No intraparenchymal hemorrhage, mass effect nor midline shift. Periventricular and subcortical white matter hypodensities consistent with chronic small vessel ischemic disease are identified. No acute large vascular territory infarcts. No abnormal extra-axial fluid collections. Basal cisterns are not effaced and midline. VASCULAR: Moderate calcific atherosclerosis of the carotid siphons and both vertebral arteries. SKULL: No skull fracture. No significant  scalp soft tissue swelling. SINUSES/ORBITS: The mastoid air-cells are clear. The included paranasal sinuses are well-aerated.The included ocular globes and orbital contents are non-suspicious. OTHER: None. IMPRESSION: Chronic stable cerebral atrophy with moderate degree of small vessel ischemia. No acute intracranial abnormality given limitations of motion artifact of images at the skull base. Electronically Signed   By: Tollie Eth M.D.   On: 04/11/2017 03:47    EKG: Independently reviewed. 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 Acute respiratory failure with hypoxia. EMS reports oxygen saturation level 88% upon arrival to facility. Likely related to acute encephalopathy of the setting of infection and pain medicine. Oxygen saturation level greater than 90% on 3 L on admission. Chest x-ray without acute  abnormality. -Admit to telemetry -Continue oxygen supplementation -Monitor oxygen saturation levels -Wean oxygen as able -Minimize sedating medications  #2. Acute encephalopathy likely related to infectious process in the setting of frequent pain medicine patient with recent repair of right hip fracture as well as aortogram with bilateral runoff per vascular last week and gangrenous foot. CT of the head without acute abnormality. No metabolic derangement. EKG without acute changes. Urinalysis consistent with infection. Urine drug screen positive for opiates. Home medications include Neurontin, Dilantin, tramadol, Robaxin. On admission daughter reports patient improving. He was provided with one dose of Narcan in the emergency department -Hold sedating medications -Check Dilantin level -obtain B12 and folate  #3. Pyelonephritis. Patient does have a history of urinary retention. When discharged from the hospital 2 weeks ago she went to facility with a Foley catheter. Daughter reports Foley was removed last week and noted that "she was not urinating very much" daughter reports at baseline patient can make her wants and needs known. She has a mild leukocytosis she is afebrile nontoxic appearing she is provided with Rocephin -Follow urine culture -Obtain a bedside bladder scan -Gentle IV fluids -Hold nephrotoxins -Monitor urine output -Continue Rocephin  #4. Atherosclerosis of left lower extremity with gangrene. Recently completed course of Augmentin. Underwent aortogram with bilateral runoff. Chart review indicates patient had a unreconstructable vessels and therefore vascular recommended above-the-knee amputation which patient refused. Left foot quite painful she's been requiring frequent analgesia -analgesia as able given #2 -antibiotics as noted above -follow up with Palliative care  #5. Hypertension. Only fair control in the emergency department. Home medications include amlodipine,  atenolol -Resume atenolol -Hold amlodipine for now -Resume all medications when indicated -Monitor  #6. Right hip fracture, status post pertrochanteric fracture with intramedullary implant on 5/10. Undergoing physical therapy at facility. Recent [facility] note indicates ambulating well with minimal pain -Physical therapy -continue lovenox  #7. Seizure. His with a history of seizure activity. No recent seizures. I medications include Dilantin -Obtain a Dilantin level -Continue Dilantin as indicated  #8. Dementia. At her baseline she is communicative and able to make her wants and needs known. -See #2  #9. Anemia. Hemoglobin 9.3 on admission.this appears close to recent trend since surgery last month. Hg 2 days ago 12.2 pre aortagram. No s/sx active bleeding. Home meds  include lovenox s/p hip surgery -fobt -anemia panel -montor     DVT prophylaxis: lovenox  Code Status: dnr  Family Communication: daughter at bedside  Disposition Plan: back to facility  Consults called: none  Admission status: inpatient    Gwenyth Bender MD [facility]  If 7PM-7AM, please contact night-coverage [email or web address] Password [facility]  04/11/2017, 7:27 AM

## 2017-04-11 NOTE — Progress Notes (Signed)
Patient arrived on unit via stretcher from ED.  Daughters at bedside. Telemetry placed per MD order and CMT notified.

## 2017-04-11 NOTE — ED Provider Notes (Signed)
[facility]-EMERGENCY DEPT Provider Note   CSN: [id number] Arrival date & time: 04/11/17  0232     History   Chief Complaint Chief Complaint  Patient presents with  . Altered Mental Status    HPI Kayla Collier is a 83 y.o. female.  HPI   Patient is a 83 year old female with history of dementia, hypertension, seizures, gangrene of left foot with atherosclerosis who presents to the ED via EMS from [facility] with altered mental status. EMS report that facility states patient was found unresponsive in her bed around 1 AM, unknown when last seen normal by facility. EMS state upon their arrival patient with fixed Claeys, no verbal response, will withdraw from pain. Patient's O2 saturation noted to be 89% on room air, patient placed on 3 L and maintaining 100%. Patient's caretaker at bedside reports that she was recently discharged from the hospital on Thursday (04/09/27). Caretaker reports patient was at her baseline but states now she seems more tired and in more discomfort due to her foot. Caregiver states the rehabilitation facility has been getting her pain meds to help with her chronic pain. Caretaker states that patient is typically responds verbally at baseline.  LEVEL V CAVEAT- AMS  Past Medical History:  Diagnosis Date  . Anxiety   . Arthritis    RA  . Closed fracture head of femur (HCC) 03/10/2017  . Gangrene (HCC)   . Hypertension   . Seizures [facility])     Patient Active Problem List   Diagnosis Date Noted  . Pyelonephritis 04/11/2017  . Closed comminuted intertrochanteric fracture of proximal end of right femur (HCC) 03/10/2017  . Alzheimer's type dementia with late onset without behavioral disturbance 03/10/2017  . Closed fracture of trochanter of right femur (HCC)   . Goals of care, counseling/discussion   . Palliative care encounter   . 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 Gangrene of foot (HCC) 02/19/2017  . Essential hypertension  02/19/2017  . Anxiety 02/19/2017  . Seizures (HCC) 02/19/2017    Past Surgical History:  Procedure Laterality Date  . CATARACT EXTRACTION    . INTRAMEDULLARY (IM) NAIL INTERTROCHANTERIC Right 03/11/2017   Procedure: RIGHT INTRAMEDULLARY (IM) NAIL INTERTROCHANTERIC;  Surgeon: Tarry Kos, MD;  Location: [facility] OR;  Service: Orthopedics;  Laterality: Right;  . TOTAL HIP ARTHROPLASTY      OB History    No data available       Home Medications    Prior to Admission medications   Medication Sig Start Date End Date Taking? Authorizing Provider  acetaminophen (TYLENOL) 500 MG tablet Take 1,000 mg by mouth 2 (two) times daily.    [provider]  Amino Acids-Protein Hydrolys (FEEDING SUPPLEMENT, PRO-STAT SUGAR FREE 64,) LIQD Take 30 mLs by mouth 2 (two) times daily. Stop date 04/16/17    [provider]  amLODipine (NORVASC) 5 MG tablet Take 5 mg by mouth daily.    [provider]  aspirin EC 81 MG tablet Take 81 mg by mouth daily.    [provider]  atenolol (TENORMIN) 50 MG tablet Take 50 mg by mouth daily.    [provider]  cholecalciferol (VITAMIN D) 1000 units tablet Take 1,000 Units by mouth daily.    [provider]  docusate sodium (COLACE) 100 MG capsule Take 1 capsule (100 mg total) by mouth 2 (two) times daily. 03/15/17   Richarda Overlie, MD  enoxaparin (LOVENOX) 40 MG/0.4ML injection Inject 0.4 mLs (40 mg total) into  the skin daily. 03/11/17   Tarry Kos, MD  gabapentin (NEURONTIN) 100 MG capsule Take 100 mg by mouth 3 (three) times daily.    [provider]  methocarbamol (ROBAXIN) 500 MG tablet Take 1 tablet (500 mg total) by mouth 4 (four) times daily. 03/15/17   Richarda Overlie, MD  Misc Natural Products [facility] ADVANCED) CAPS Take 1 capsule by mouth daily.    [provider]  phenytoin (DILANTIN) 100 MG ER capsule Take 100 mg by mouth at bedtime.     [provider]  phenytoin (DILANTIN) 30 MG  ER capsule Take 30 mg by mouth 3 (three) times a week. Take at bedtime with 100mg  on Monday, Wednesday, and Friday    [provider]  polyethylene glycol (MIRALAX / GLYCOLAX) packet Take 17 g by mouth daily as needed for mild constipation. 03/15/17   03/17/17, MD  potassium chloride (MICRO-K) 10 MEQ CR capsule Take 10 mEq by mouth daily.     [provider]  traMADol (ULTRAM) 50 MG tablet Take 1 tablet (50 mg total) by mouth every 6 (six) hours as needed. 03/11/17   05/11/17, MD  UNABLE TO FIND Med Name: Med pass 120 mL 2 times daily.    [provider]  vitamin B-12 (CYANOCOBALAMIN) 1000 MCG tablet Take 1,000 mcg by mouth daily.    [provider]  vitamin E (VITAMIN E) 400 UNIT capsule Take 400 Units by mouth daily.    [provider]    Family History Family History  Problem Relation Age of Onset  . Family history unknown: Yes    Social History Social History  Substance Use Topics  . Smoking status: Never Smoker  . Smokeless tobacco: Never Used  . Alcohol use No     Allergies   Patient has no known allergies.   Review of Systems Review of Systems  Unable to perform ROS: Patient unresponsive     Physical Exam Updated Vital Signs BP (!) 173/60   Pulse 92   Temp 98.7 F (37.1 C) (Rectal)   Resp (!) 24   SpO2 99%   Physical Exam  Constitutional:  Frail elderly appearing female  HENT:  Head: Normocephalic and atraumatic.  Mouth/Throat: Oropharynx is clear and moist. No oropharyngeal exudate.  Eyes: Conjunctivae and EOM are normal. Right eye exhibits no discharge. Left eye exhibits no discharge. No scleral icterus.  Pinpoint pupils bilaterally  Neck: Normal range of motion. Neck supple.  Cardiovascular: Normal rate, regular rhythm, normal heart sounds and intact distal pulses.   Pulmonary/Chest: Effort normal and breath sounds normal. No respiratory distress. She has no wheezes. She has no rales. She exhibits no  tenderness.  Abdominal: Soft. She exhibits no distension. There is no tenderness.  Musculoskeletal: She exhibits no edema.  Pt spontaneously moving all 4 extremities.  Neurological: GCS eye subscore is 3. GCS verbal subscore is 1. GCS motor subscore is 4.  Pt with no verbal response. Withdrawing from painful stimuli. GCS 8  Skin: Skin is warm and dry.  Nursing note and vitals reviewed.    ED Treatments / Results  Labs (all labs ordered are listed, but only abnormal results are displayed) Labs Reviewed  COMPREHENSIVE METABOLIC PANEL - Abnormal; Notable for the following:       Result Value   Glucose, Bld 157 (*)    BUN 23 (*)    Albumin 2.7 (*)    Alkaline Phosphatase 152 (*)    All  other components within normal limits  CBC - Abnormal; Notable for the following:    WBC 14.8 (*)    RBC 3.13 (*)    Hemoglobin 9.3 (*)    HCT 28.8 (*)    All other components within normal limits  URINALYSIS, ROUTINE W REFLEX MICROSCOPIC - Abnormal; Notable for the following:    APPearance TURBID (*)    Hgb urine dipstick SMALL (*)    Protein, ur 100 (*)    Nitrite POSITIVE (*)    Leukocytes, UA LARGE (*)    Bacteria, UA MANY (*)    All other components within normal limits  RAPID URINE DRUG SCREEN, HOSP PERFORMED - Abnormal; Notable for the following:    Opiates POSITIVE (*)    All other components within normal limits  CBG MONITORING, ED - Abnormal; Notable for the following:    Glucose-Capillary 131 (*)    All other components within normal limits  URINE CULTURE  ETHANOL  CBG MONITORING, ED    EKG  EKG Interpretation None       Radiology Dg Chest 2 View  Result Date: 04/11/2017 CLINICAL DATA:  Patient found unresponsive in bed. EXAM: CHEST  2 VIEW COMPARISON:  03/13/2017 FINDINGS: Stable mild cardiomegaly with aortic atherosclerosis. Left basilar atelectasis is noted. Interval clearing consolidation in the left lower lobe and probable small left effusion. No overt pulmonary  edema. No acute osseous abnormality. IMPRESSION: No active cardiopulmonary disease. Stable cardiomegaly with aortic atherosclerosis. Left basilar atelectasis. Electronically Signed   By: Tollie Eth M.D.   On: 04/11/2017 03:49   Ct Head Wo Contrast  Result Date: 04/11/2017 CLINICAL DATA:  Found unresponsive in bed. EXAM: CT HEAD WITHOUT CONTRAST TECHNIQUE: Contiguous axial images were obtained from the base of the skull through the vertex without intravenous contrast. COMPARISON:  03/10/2017 FINDINGS: Motion degraded images limit assessment at the skullbase. BRAIN: There is chronic moderate sulcal and ventricular prominence consistent with superficial and central atrophy. No intraparenchymal hemorrhage, mass effect nor midline shift. Periventricular and subcortical white matter hypodensities consistent with chronic small vessel ischemic disease are identified. No acute large vascular territory infarcts. No abnormal extra-axial fluid collections. Basal cisterns are not effaced and midline. VASCULAR: Moderate calcific atherosclerosis of the carotid siphons and both vertebral arteries. SKULL: No skull fracture. No significant scalp soft tissue swelling. SINUSES/ORBITS: The mastoid air-cells are clear. The included paranasal sinuses are well-aerated.The included ocular globes and orbital contents are non-suspicious. OTHER: None. IMPRESSION: Chronic stable cerebral atrophy with moderate degree of small vessel ischemia. No acute intracranial abnormality given limitations of motion artifact of images at the skull base. Electronically Signed   By: Tollie Eth M.D.   On: 04/11/2017 03:47    Procedures Procedures (including critical care time)  Medications Ordered in ED Medications  naloxone (NARCAN) 2 MG/2ML injection (2 mg  Not Given 04/11/17 0419)  cefTRIAXone (ROCEPHIN) 1 g in dextrose 5 % 50 mL IVPB (not administered)  naloxone (NARCAN) injection 0.2 mg (0.2 mg Intravenous Given 04/11/17 0426)     Initial  Impression / Assessment and Plan / ED Course  I have reviewed the triage vital signs and the nursing notes.  Pertinent labs & imaging results that were available during my care of the patient were reviewed by me and considered in my medical decision making (see chart for details).    Patient presents via EMS from [facility] with altered mental status. Patient was found unresponsive laying in her bed, O2 sat 89% on RA,  nonverbal. VSS. On initial exam patient only responds to painful stimuli, nonverbal. Patient spontaneously moving all 4 extremities. Lungs clear to auscultation bilaterally. RRR. Abdomen soft and nontender. Patient's caretaker at bedside states patient's behavior is not consistent with her baseline mental status. Caretaker states she attributes the change in patient's symptoms to her pain medications which she is prescribed for her chronic pain related to gangrene of her left foot. EKG showed sinus rhythm with no acute ischemic changes. CT head with no acute abnormality. Chest x-ray negative. WBC 14.8. Remaining labs unremarkable. Discussed pt with Dr. Corlis Leak. Pt given 0.2mg  Narcan in the ED, nurse reports pt became more alert for a appx. 1 minute, verbally responded and followed commands but then fell back asleep. UA consistent with UTI. Pt started on IV abx. Patient has remained hemodynamically stable while in the ED. Plan to admit pt for AMS secondary to UTI and narcotics for chronic pain. Consulted hospitalist. Dr. Maryfrances Bunnell agrees to admission.  Final Clinical Impressions(s) / ED Diagnoses   Final diagnoses:  Altered mental status, unspecified altered mental status type  Urinary tract infection with hematuria, site unspecified    New Prescriptions New Prescriptions   No medications on file     Barrett Henle, Cordelia Poche 04/11/17 0630    Abelino Derrick, MD 04/11/17 0730

## 2017-04-11 NOTE — ED Notes (Signed)
Pt more responsive when cathing. Pt opened eyes and made contact with this RN. Pt responded when spoken to.

## 2017-04-11 NOTE — ED Notes (Signed)
[facility] hospitalist NP at bedside

## 2017-04-11 NOTE — ED Notes (Signed)
Pt briefly opened her eyes, did not verbally respond. Pt did follow command to squeeze her hand. Pt closed her eyes and went back to being responsive to only pain.

## 2017-04-11 NOTE — ED Triage Notes (Signed)
Pt BIB EMS from [facility]. Per EMS, facility staff very poor historians. EMS crew told pt found unresponsive in bed at 0100. No known last seen normal from facility staff. Pt has hospital band from recent admission, but staff didn't know what she had been to hospital for. Per EMS, pt has fixed gaze with no response to verbal stimuli. Will withdraw from pain. On scene, pt SpO2 89% on RA. Placed on 3L O2 via [place] and maintaining 100%.

## 2017-04-11 NOTE — ED Notes (Signed)
Checked CBG 131, RN Mario informed

## 2017-04-11 NOTE — ED Notes (Signed)
Pt's daughter is at bedside. Daughter states pt normally able to have conversation and interact with others. Recent admission was for gangrene to both feet. D/C'd on pain management for gangrene. Wonders if overdosed.

## 2017-04-11 NOTE — Progress Notes (Signed)
672 mL via bladder scan.  MD notified.

## 2017-04-11 NOTE — ED Notes (Signed)
ED Provider at bedside. 

## 2017-04-11 NOTE — Consult Note (Signed)
Consultation Note Date: 04/11/2017   Patient Name: Kayla Collier  DOB: 1918/03/08  MRN: [id number]  Age / Sex: 83 y.o., female  PCP: Vernie Shanks, MD Referring Physician: Elwin Mocha, MD  Reason for Consultation: Establishing goals of care, Hospice Evaluation, Inpatient hospice referral, Non pain symptom management, Pain control, Psychosocial/spiritual support and Terminal Care  HPI/Patient Profile: 83 y.o. female  with past medical history of Vascular dementia, hypertension, seizures, gangrene of the left foot, recent aortogram with bilateral runoff , recent hip fracture admitted on 04/11/2017 with lethargy, weakness. Patient has been seen by palliative medicine services in May 2018. She is also been receiving palliative medicine consults when she was living in the home with her daughter. Since her hip fracture she has been living in a skilled nursing facility, initially for rehabilitation but now due to her vascular condition, gangrenous left foot, she is no longer a rehabilitation candidate and is now residing there. Patient was found to have another urinary tract infection  Clinical Assessment and Goals of Care: Met with patient's daughter Kayla Collier for goals of care discussion. Patient is unable to participate in discussion secondary to delirium and  unmanaged pain. She is calling out to her mother and another family member that is deceased. She is also intermittently screaming in pain. Her daughter Kayla Collier shares that "this is what it's been like 80% of the time". Patient has had severe pain to her left foot and is thus far only been prescribed tramadol. Per her daughter when she was given oxycodone and Vicodin she had much better pain control. Daughter describes a cascade of sentinel events beginning with worsening vascular status, now gangrene of the left foot (patient does not want to have amputation),  falls where she sustained a hip fracture, recurrent urinary tract infections all in the setting of worsening dementia. Her daughter feels that her mother is dying and is in distress, pain.  Patient is unable to make her own healthcare decisions at this point, and her daughter, Kayla Collier, is her healthcare proxy.[phone]    SUMMARY OF RECOMMENDATIONS   DO NOT RESUSCITATE DO NOT INTUBATE For her mother to receive stronger pain medicine even if it increases her confusion or lethargy. Daughter believes her mother is dying  and would like to pursue residential hospice Continue to treat the treatable while in the hospital such as antibiotics and IV fluids Code Status/Advance Care Planning:  DNR    Symptom Management:   Pain: Currently patient is only on Tylenol as needed for pain which is inadequate. She has taken tramadol with lack of effect. We'll start morphine concentrate at 2.5-5 mg every 4 hours as needed. Monitor for need for scheduled dosing. We'll also schedule Tylenol liquid  Palliative Prophylaxis:   Aspiration, Bowel Regimen, Delirium Protocol, Eye Care, Frequent Pain Assessment, Oral Care, Palliative Wound Care and Turn Reposition  Additional Recommendations (Limitations, Scope, Preferences):  Avoid Hospitalization, Initiate Comfort Feeding, No Artificial Feeding, No Blood Transfusions, No Chemotherapy, No Diagnostics, No Hemodialysis, No Radiation, No Surgical  Procedures and No Tracheostomy  Psycho-social/Spiritual:   Desire for further Chaplaincy support:no  Additional Recommendations: Grief/Bereavement Support  Prognosis:   < 2 weeks in the setting of moderate to severe dementia, recurrent urinary tract infections, gangrene of the left foot, protein calorie malnutrition with an albumin of 2.7. Patient has unmanaged pain with what appears to be nearing death experiences and terminal agitation. She is only eating bites and sips. Given severity of her symptoms, I do  not anticipate she will be able to take oral medications much longer  Discharge Planning: Hospice facility      Primary Diagnoses: Present on Admission: . Pyelonephritis . Alzheimer's type dementia with late onset without behavioral disturbance . Anxiety . 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 Essential hypertension . Acute encephalopathy . Acute respiratory failure (HCC) . Anemia . Urinary retention . Closed fracture of trochanter of right femur (HCC)   I have reviewed the medical record, interviewed the patient and family, and examined the patient. The following aspects are pertinent.  Past Medical History:  Diagnosis Date  . Anxiety   . Arthritis    RA  . Closed fracture head of femur (HCC) 03/10/2017  . Gangrene (HCC)   . Hypertension   . Seizures (HCC)   . Urinary retention   . UTI (urinary tract infection)    Social History   Social History  . Marital status: Widowed    Spouse name: N/A  . Number of children: N/A  . Years of education: N/A   Social History Main Topics  . Smoking status: Never Smoker  . Smokeless tobacco: Never Used  . Alcohol use No  . Drug use: No  . Sexual activity: Not Asked   Other Topics Concern  . None   Social History Narrative  . None   Family History  Problem Relation Age of Onset  . Family history unknown: Yes   Scheduled Meds: . aspirin EC  81 mg Oral Daily  . atenolol  50 mg Oral Daily  . cholecalciferol  1,000 Units Oral Daily  . clotrimazole  10 mg Oral 5 X Daily  . docusate sodium  100 mg Oral BID  . enoxaparin (LOVENOX) injection  40 mg Subcutaneous Q24H  . naloxone      . phenytoin  100 mg Oral QHS  . [START ON 04/12/2017] phenytoin  30 mg Oral Once per day on Mon Wed Fri  . potassium chloride  10 mEq Oral Daily  . sodium chloride flush  3 mL Intravenous Q12H  . vitamin B-12  1,000 mcg Oral Daily  . vitamin E  400 Units Oral Daily   Continuous Infusions: . sodium chloride 100  mL/hr at 04/11/17 0954  . [START ON 04/12/2017] cefTRIAXone (ROCEPHIN)  IV     PRN Meds:.acetaminophen **OR** acetaminophen, albuterol, ondansetron **OR** ondansetron (ZOFRAN) IV, polyethylene glycol Medications Prior to Admission:  Prior to Admission medications   Medication Sig Start Date End Date Taking? Authorizing Provider  acetaminophen (TYLENOL) 500 MG tablet Take 1,000 mg by mouth 3 (three) times daily.    Yes [provider]  Amino Acids-Protein Hydrolys (FEEDING SUPPLEMENT, PRO-STAT SUGAR FREE 64,) LIQD Take 30 mLs by mouth 2 (two) times daily. Stop date 04/16/17   Yes [provider]  amLODipine (NORVASC) 5 MG tablet Take 5 mg by mouth daily.   Yes [provider]  aspirin EC 81 MG tablet Take 81 mg by mouth daily.   Yes [provider]  atenolol (  TENORMIN) 50 MG tablet Take 50 mg by mouth daily.   Yes [provider]  cholecalciferol (VITAMIN D) 1000 units tablet Take 1,000 Units by mouth daily.   Yes [provider]  docusate sodium (COLACE) 100 MG capsule Take 1 capsule (100 mg total) by mouth 2 (two) times daily. 03/15/17  Yes Reyne Dumas, MD  enoxaparin (LOVENOX) 40 MG/0.4ML injection Inject 0.4 mLs (40 mg total) into the skin daily. 03/11/17  Yes Leandrew Koyanagi, MD  gabapentin (NEURONTIN) 100 MG capsule Take 100 mg by mouth 3 (three) times daily.   Yes [provider]  levofloxacin (LEVAQUIN) 500 MG tablet Take 500 mg by mouth daily.   Yes [provider]  methocarbamol (ROBAXIN) 500 MG tablet Take 1 tablet (500 mg total) by mouth 4 (four) times daily. 03/15/17  Yes Reyne Dumas, MD  Misc Natural Products (TART CHERRY ADVANCED) CAPS Take 1 capsule by mouth daily.   Yes [provider]  phenytoin (DILANTIN) 100 MG ER capsule Take 100 mg by mouth at bedtime.    Yes [provider]  phenytoin (DILANTIN) 30 MG ER capsule Take 130 mg by mouth 3 (three) times a week. Take at bedtime with 14m on  Monday, Wednesday, and Friday   Yes [provider]  polyethylene glycol (MIRALAX / GLYCOLAX) packet Take 17 g by mouth daily as needed for mild constipation. 03/15/17  Yes AReyne Dumas MD  potassium chloride (MICRO-K) 10 MEQ CR capsule Take 10 mEq by mouth daily.    Yes [provider]  traMADol (ULTRAM) 50 MG tablet Take 1 tablet (50 mg total) by mouth every 6 (six) hours as needed. Patient taking differently: Take 50 mg by mouth every 6 (six) hours as needed for moderate pain.  03/11/17  Yes XLeandrew Koyanagi MD  UNABLE TO FIND Med Name: Med pass 120 mL 2 times daily.   Yes [provider]  vitamin B-12 (CYANOCOBALAMIN) 1000 MCG tablet Take 1,000 mcg by mouth daily.   Yes [provider]  vitamin E (VITAMIN E) 400 UNIT capsule Take 400 Units by mouth daily.   Yes [provider]   No Known Allergies Review of Systems  Unable to perform ROS: Dementia    Physical Exam  Constitutional:  Cachectic, frail elderly female who appears to be in severe pain  HENT:  Head: Normocephalic and atraumatic.  Temporal wasting  Pulmonary/Chest: Effort normal.  Musculoskeletal:  Has her left leg pulled  up towards her chest  Neurological: She is alert.  Disoriented Oriented to herself and recognizes her daughter Does not know where she is, month date or year  Skin: Skin is warm.  Gangrene of left foot with necrotic toes Right groin lesion is that is supposed aortogram  Psychiatric:  Confused Speaking to people that are deceased  Nursing note and vitals reviewed.   Vital Signs: BP (!) 171/67 (BP Location: Right Arm)   Pulse 87   Temp 98.8 F (37.1 C) (Oral)   Resp 18   SpO2 98%  Pain Assessment: FLACC       SpO2: SpO2: 98 % O2 Device:SpO2: 98 % O2 Flow Rate: .O2 Flow Rate (L/min): 4 L/min  IO: Intake/output summary:  Intake/Output Summary (Last 24 hours) at 04/11/17 1452 Last data filed at 04/11/17 1129  Gross per 24 hour  Intake               220 ml  Output  0 ml  Net              220 ml    LBM: Last BM Date: 04/11/17 Baseline Weight:   Most recent weight:       Palliative Assessment/Data:   Flowsheet Rows     Most Recent Value  Intake Tab  Referral Department  Hospitalist  Unit at Time of Referral  Med/Surg Unit  Palliative Care Primary Diagnosis  Sepsis/Infectious Disease  Date Notified  04/11/17  Palliative Care Type  Return patient Palliative Care  Reason for referral  Pain, Non-pain Symptom, Clarify Goals of Care, Counsel Regarding Hospice  Date of Admission  04/11/17  Date first seen by Palliative Care  04/11/17  # of days Palliative referral response time  0 Day(s)  # of days IP prior to Palliative referral  0  Clinical Assessment  Palliative Performance Scale Score  30%  Pain Max last 24 hours  Not able to report  Pain Min Last 24 hours  Not able to report  Dyspnea Max Last 24 Hours  Not able to report  Dyspnea Min Last 24 hours  Not able to report  Nausea Max Last 24 Hours  Not able to report  Nausea Min Last 24 Hours  Not able to report  Anxiety Max Last 24 Hours  Not able to report  Anxiety Min Last 24 Hours  Not able to report  Other Max Last 24 Hours  Not able to report  Psychosocial & Spiritual Assessment  Palliative Care Outcomes  Patient/Family meeting held?  Yes  Who was at the meeting?  daughter Kayla Collier  Palliative Care Outcomes  Improved pain interventions, Counseled regarding hospice, Provided psychosocial or spiritual support, Transitioned to hospice, Clarified goals of care  Patient/Family wishes: Interventions discontinued/not started   Mechanical Ventilation, BiPAP, Hemodialysis, Transfusion, Vasopressors, Trach, NIPPV, Tube feedings/TPN, PEG  Palliative Care follow-up planned  Yes, Facility      Time In: 1330 Time Out: 1445 Time Total: 75 min Greater than 50%  of this time was spent counseling and coordinating care related to the above assessment and plan. Staffed  with Dr. Aggie Moats  Signed by: Dory Horn, NP   Please contact Palliative Medicine Team phone at [phone] for questions and concerns.  For individual provider: See Shea Evans

## 2017-04-12 ENCOUNTER — Encounter (HOSPITAL_COMMUNITY): Payer: Self-pay | Admitting: Vascular Surgery

## 2017-04-12 DIAGNOSIS — R4182 Altered mental status, unspecified: Secondary | ICD-10-CM

## 2017-04-12 DIAGNOSIS — J9601 Acute respiratory failure with hypoxia: Secondary | ICD-10-CM

## 2017-04-12 LAB — CBC
HCT: 34.2 % — ABNORMAL LOW (ref 36.0–46.0)
Hemoglobin: 11.2 g/dL — ABNORMAL LOW (ref 12.0–15.0)
MCH: 29.6 pg (ref 26.0–34.0)
MCHC: 32.7 g/dL (ref 30.0–36.0)
MCV: 90.2 fL (ref 78.0–100.0)
PLATELETS: 383 10*3/uL (ref 150–400)
RBC: 3.79 MIL/uL — ABNORMAL LOW (ref 3.87–5.11)
RDW: 14.9 % (ref 11.5–15.5)
WBC: 12.1 10*3/uL — AB (ref 4.0–10.5)

## 2017-04-12 LAB — BASIC METABOLIC PANEL
Anion gap: 9 (ref 5–15)
BUN: 11 mg/dL (ref 6–20)
CALCIUM: 9 mg/dL (ref 8.9–10.3)
CO2: 26 mmol/L (ref 22–32)
CREATININE: 0.49 mg/dL (ref 0.44–1.00)
Chloride: 100 mmol/L — ABNORMAL LOW (ref 101–111)
GFR calc Af Amer: >60 mL/min (ref >60)
Glucose, Bld: 128 mg/dL — ABNORMAL HIGH (ref 65–99)
Potassium: 3.2 mmol/L — ABNORMAL LOW (ref 3.5–5.1)
SODIUM: 135 mmol/L (ref 135–145)

## 2017-04-12 LAB — HEMOGLOBIN A1C
Hgb A1c MFr Bld: 5 % (ref 4.8–5.6)
Mean Plasma Glucose: 97 mg/dL

## 2017-04-12 LAB — GLUCOSE, CAPILLARY: Glucose-Capillary: 123 mg/dL — ABNORMAL HIGH (ref 65–99)

## 2017-04-12 LAB — URINE CULTURE: Culture: NO GROWTH

## 2017-04-12 MED ORDER — ENOXAPARIN SODIUM 30 MG/0.3ML ~~LOC~~ SOLN: 30.0000 mg | SUBCUTANEOUS | Status: DC

## 2017-04-12 MED ORDER — LORAZEPAM 2 MG/ML IJ SOLN
0.5000 mg | INTRAMUSCULAR | Status: DC | PRN
  Administered 2017-04-12 (×2): 0.5 mg via INTRAVENOUS
  Filled 2017-04-12 (×2): qty 1

## 2017-04-12 MED ORDER — HYDRALAZINE HCL 20 MG/ML IJ SOLN: 2.0000 mg | INTRAMUSCULAR | Status: DC | PRN

## 2017-04-12 MED ORDER — LORAZEPAM 2 MG/ML PO CONC: 0.5000 mg | ORAL | Status: DC | PRN

## 2017-04-12 MED ORDER — PHENYTOIN SODIUM 50 MG/ML IJ SOLN: 100.0000 mg | Freq: Every day | INTRAMUSCULAR | Status: DC

## 2017-04-12 MED ORDER — HYDRALAZINE HCL 20 MG/ML IJ SOLN: 5.0000 mg | INTRAMUSCULAR | Status: DC | PRN

## 2017-04-12 MED ORDER — POTASSIUM CHLORIDE 10 MEQ/100ML IV SOLN
10.0000 meq | INTRAVENOUS | Status: AC
  Administered 2017-04-12 (×4): 10 meq via INTRAVENOUS
  Filled 2017-04-12 (×3): qty 100

## 2017-04-12 MED ORDER — PHENYTOIN SODIUM 50 MG/ML IJ SOLN
100.0000 mg | Freq: Every day | INTRAMUSCULAR | Status: DC
  Administered 2017-04-12: 100 mg via INTRAVENOUS
  Filled 2017-04-12: qty 2

## 2017-04-12 MED ORDER — LORAZEPAM 0.5 MG PO TABS: 0.5000 mg | ORAL_TABLET | ORAL | Status: DC | PRN

## 2017-04-12 NOTE — Progress Notes (Addendum)
Daily Progress Note   Patient Name: Kayla Collier       Date: 04/12/2017 DOB: 12-10-1917  Age: 83 y.o. MRN#: [id number] Attending Physician: Leda Min* Primary Care Physician: Ileana Ladd, MD Admit Date: 04/11/2017  Reason for Consultation/Follow-up: Establishing goals of care, Non pain symptom management, Pain control and Terminal Care  Subjective: Patient in bed. Awake. Not oriented. Focuses momentarily on me, then talks to someone on the ceiling. Does not appear to be in pain, but has constant twitching in left face, arm, and leg. Spoke with Kayla Batten, RN- no crying or calling out this morning, not eating or drinking. Awaiting residential hospice consult.  Review of Systems  Unable to perform ROS: Dementia    Length of Stay: 1  Current Medications: Scheduled Meds:  . acetaminophen (TYLENOL) oral liquid 160 mg/5 mL  500 mg Oral Q8H  . docusate sodium  100 mg Oral BID  . feeding supplement (ENSURE ENLIVE)  237 mL Oral BID BM  . mouth rinse  15 mL Mouth Rinse BID  . sodium chloride flush  3 mL Intravenous Q12H    Continuous Infusions:   PRN Meds: acetaminophen **OR** acetaminophen, albuterol, morphine, ondansetron **OR** ondansetron (ZOFRAN) IV, polyethylene glycol  Physical Exam  Constitutional:  cachetic  Cardiovascular: Normal rate and regular rhythm.   Tachycardic   Pulmonary/Chest: No respiratory distress.  Skin: Skin is warm and dry.  Nursing note and vitals reviewed.           Vital Signs: BP (!) 162/66 (BP Location: Right Arm)   Pulse 88   Temp (!) 102.2 F (39 C) (Rectal)   Resp 18   Ht 4\' 11"  (1.499 m)   Wt 40.8 kg (90 lb)   SpO2 98%   BMI 18.18 kg/m  SpO2: SpO2: 98 % O2 Device: O2 Device: Not Delivered O2 Flow Rate: O2 Flow Rate (L/min):  4 L/min  Intake/output summary:  Intake/Output Summary (Last 24 hours) at 04/12/17 1018 Last data filed at 04/12/17 0708  Gross per 24 hour  Intake              420 ml  Output             1100 ml  Net             -680 ml  LBM: Last BM Date: 04/11/17 Baseline Weight: Weight: 40.8 kg (90 lb) Most recent weight: Weight: 40.8 kg (90 lb)       Palliative Assessment/Data: PPS: 10%    Flowsheet Rows     Most Recent Value  Intake Tab  Referral Department  Hospitalist  Unit at Time of Referral  Med/Surg Unit  Palliative Care Primary Diagnosis  Sepsis/Infectious Disease  Date Notified  04/11/17  Palliative Care Type  Return patient Palliative Care  Reason for referral  Pain, Non-pain Symptom, Clarify Goals of Care, Counsel Regarding Hospice  Date of Admission  04/11/17  Date first seen by Palliative Care  04/11/17  # of days Palliative referral response time  0 Day(s)  # of days IP prior to Palliative referral  0  Clinical Assessment  Palliative Performance Scale Score  30%  Pain Max last 24 hours  Not able to report  Pain Min Last 24 hours  Not able to report  Dyspnea Max Last 24 Hours  Not able to report  Dyspnea Min Last 24 hours  Not able to report  Nausea Max Last 24 Hours  Not able to report  Nausea Min Last 24 Hours  Not able to report  Anxiety Max Last 24 Hours  Not able to report  Anxiety Min Last 24 Hours  Not able to report  Other Max Last 24 Hours  Not able to report  Psychosocial & Spiritual Assessment  Palliative Care Outcomes  Patient/Family meeting held?  Yes  Who was at the meeting?  daughter Kayla Collier  Palliative Care Outcomes  Improved pain interventions, Counseled regarding hospice, Provided psychosocial or spiritual support, Transitioned to hospice, Clarified goals of care  Patient/Family wishes: Interventions discontinued/not started   Mechanical Ventilation, BiPAP, Hemodialysis, Transfusion, Vasopressors, Trach, NIPPV, Tube feedings/TPN, PEG  Palliative Care  follow-up planned  Yes, Facility      Patient Active Problem List   Diagnosis Date Noted  . Pyelonephritis 04/11/2017  . Acute encephalopathy 04/11/2017  . Acute respiratory failure (HCC) 04/11/2017  . Anemia 04/11/2017  . Urinary retention 04/11/2017  . Pressure injury of skin 04/11/2017  . Palliative care by specialist   . Closed comminuted intertrochanteric fracture of proximal end of right femur (HCC) 03/10/2017  . Alzheimer's type dementia with late onset without behavioral disturbance 03/10/2017  . Closed fracture of trochanter of right femur (HCC)   . Goals of care, counseling/discussion   . Palliative care encounter   . 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 Gangrene of foot (HCC) 02/19/2017  . Essential hypertension 02/19/2017  . Anxiety 02/19/2017  . Seizures (HCC) 02/19/2017    Palliative Care Assessment & Plan   Patient Profile: 83 y.o. female  with past medical history of Vascular dementia, hypertension, seizures, gangrene of the left foot, recent aortogram with bilateral runoff , recent hip fracture admitted on 04/11/2017 with lethargy, weakness. Patient has been seen by palliative medicine services in May 2018. She is also been receiving palliative medicine consults when she was living in the home with her daughter. Since her hip fracture she has been living in a skilled nursing facility, initially for rehabilitation but now due to her vascular condition, gangrenous left foot, she is no longer a rehabilitation candidate and is now residing there. Patient was found to have another urinary tract infection. Palliative medicine consulted for GOC and symptom management. Initial consult on 6/10 at which time decision was made to seek residential hospice, continue IV fluids and antibiotics  during hospitalization only.  Assessment/Recommendations/Plan   Will add low dose lorazepam 0.5mg  for L sided twitching  Awaiting residential hospice  consult  Goals of Care and Additional Recommendations:  Limitations on Scope of Treatment: Minimize Medications, Initiate Comfort Feeding, No Artificial Feeding, No Diagnostics and No Lab Draws  Code Status:  DNR  Prognosis:   < 2 weeks due to little to no po intake, planned transition to full comfort care at discharge  Discharge Planning:  Hospice facility pending evaluation and bed availability  Care plan was discussed with patient's RN- Kayla Collier.  Thank you for allowing the Palliative Medicine Team to assist in the care of this patient.   Time In: 1000 Time Out: 1026 Total Time 26 minutes Prolonged Time Billed No      Greater than 50%  of this time was spent counseling and coordinating care related to the above assessment and plan.  Ocie Bob, AGNP-C Palliative Medicine   Please contact Palliative Medicine Team phone at [phone] for questions and concerns.

## 2017-04-12 NOTE — Progress Notes (Addendum)
PROGRESS NOTE    Kayla Collier  JJH:[id number] DOB: December 14, 1917 DOA: 04/11/2017 PCP: Ileana Ladd, MD     Brief Narrative:  Kayla Collier is a 83 y.o. female with medical history significant for dementia, hypertension, seizures, gangrene of the left foot with atherosclerosis recently underwent aortogram with bilateral runoff per vascular presents to the emergency department from a facility with the chief complaint generalized weakness and lethargy. Daughter reports facility called EMS when patient was found responsive around 1 AM. Unknown facility when she was last seen normal. EMS reports she had a fixed gaze did not respond verbally but did withdraw from pain. At that time her oxygen saturation was noted to be 88% on room air. Patient was recently discharged from the hospital after arteriogram and then 2 weeks prior to that underwent repair of hip fracture. Caregiver reports patient has gradually become more lethargic has been getting pain medicine for the discomfort to her left foot. Daughter reports patient has a propensity for urinary retention and during her last hospitalization required a Foley catheter. Patient was admitted to the hospital due to acute respiratory failure with hypoxia, acute encephalopathy, urinary tract infection.   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 Urine culture is negative. Rocephin discontinued. -Recently completed course of Augmentin. Underwent aortogram with bilateral runoff. Chart review indicates patient had a unreconstructable vessels and therefore vascular recommended above-the-knee amputation which patient refused. -Blood culture pending  Acute encephalopathy  -Could be related to infectious process in the setting of frequent pain medicine in elderly patient with recent repair of right hip fracture as well as aortogram with bilateral runoff per vascular last week and gangrenous foot. CT of the head without acute abnormality. No metabolic derangement. EKG without acute changes. -Monitor   Pyuria -Urine culture negative, stop Rocephin   Hypertension -Has been refusing oral medications, hydralazine IV prn   Right hip fracture, status post pertrochanteric fracture with intramedullary implant on 5/10 -Undergoing physical therapy at facility. Recent [facility] note indicates ambulating well with minimal pain  Seizure -Has history of seizure activity. No recent seizures -Patient refusing oral medications, resume dilantin IV for now   Dementia -At her baseline she is communicative and able to make her wants and needs known  Chronic normocytic anemia -Baseline Hgb 9  -Stable   Hypokalemia -Replace IV   Goals of care -Palliative care consulted -Awaiting hospice enrollment -Spoke at length with daughter over the phone this morning. Daughter with like to continue all noninvasive medical treatment until she is transferred to hospice.    DVT prophylaxis: lovenox Code Status: DNR Family Communication: daughter over the phone at length  Disposition Plan: pending hospice evaluation   Consultants:   Palliative care  Procedures:   None  Antimicrobials:  Anti-infectives    Start     Dose/Rate Route Frequency Ordered Stop   04/12/17 0000  cefTRIAXone (ROCEPHIN) 1  g in dextrose 5 % 50  mL IVPB  Status:  Discontinued     1 g 100 mL/hr over 30 Minutes Intravenous Every 24 hours 04/11/17 0947 04/12/17 0917   04/11/17 0615  cefTRIAXone (ROCEPHIN) 1 g in dextrose 5 % 50 mL IVPB     1 g 100 mL/hr over 30 Minutes Intravenous  Once 04/11/17 0612 04/11/17 0713      Subjective: Patient not alert, not interactive on exam   Objective: Vitals:   04/11/17 2228 04/12/17 0616 04/12/17 1040 04/12/17 1125  BP: (!) 162/66  (!) 211/75 (!) 179/96  Pulse: 88  (!) 103 (!) 103  Resp: 18  17   Temp: 98.8 F (37.1 C) (!) 102.2 F (39 C) 98.8 F (37.1 C)   TempSrc: Oral Rectal Oral   SpO2: 98%  100% 100%  Weight:      Height:        Intake/Output Summary (Last 24 hours) at 04/12/17 1328 Last data filed at 04/12/17 1000  Gross per 24 hour  Intake              250 ml  Output             1100 ml  Net             -850 ml   Filed Weights   04/11/17 1455  Weight: 40.8 kg (90 lb)    Examination:  General exam: Appears calm, somnolent, with muscle twitching, cachectic  Respiratory system: Clear to auscultation. Respiratory effort normal. Cardiovascular system: S1 & S2 heard, RRR. No JVD, murmurs, rubs, gallops or clicks. No pedal edema. Gastrointestinal system: Abdomen is nondistended, soft and nontender. No organomegaly or masses felt. Normal bowel sounds heard. Central nervous system: Obtunded, not alert to voice  Extremities: Symmetric  Skin: No rashes, lesions or ulcers on exposed skin  Psychiatry: Dementia   Data Reviewed: I have personally reviewed following labs and imaging studies  CBC:  Recent Labs Lab 04/09/17 0740 04/11/17 0248 04/12/17 0455  WBC  --  14.8* 12.1*  HGB 12.2 9.3* 11.2*  HCT 36.0 28.8* 34.2*  MCV  --  92.0 90.2  PLT  --  392 383   Basic Metabolic Panel:  Recent Labs Lab 04/09/17 0740 04/11/17 0248 04/11/17 1057 04/12/17 0455  NA 135 136  --  135  K 3.7 3.6  --  3.2*  CL 98* 101  --  100*  CO2  --  22  --  26  GLUCOSE 99 157*  --   128*  BUN 25* 23*  --  11  CREATININE 0.40* 0.74  --  0.49  CALCIUM  --  9.3  --  9.0  MG  --   --  1.7  --   PHOS  --   --  2.5  --    GFR: Estimated Creatinine Clearance: 25.9 mL/min (by C-G formula based on SCr of 0.49 mg/dL). Liver Function Tests:  Recent Labs Lab 04/11/17 0248  AST 35  ALT 20  ALKPHOS 152*  BILITOT 0.6  PROT 6.6  ALBUMIN 2.7*   No results for input(s): LIPASE, AMYLASE in the last 168 hours. No results for input(s): AMMONIA in the last 168 hours. Coagulation Profile: No results for input(s): INR, PROTIME in the last 168 hours. Cardiac Enzymes: No results for input(s): CKTOTAL, CKMB, CKMBINDEX, TROPONINI in the last 168 hours. BNP (last 3 results) No results for input(s): PROBNP in the last 8760 hours. HbA1C:  Recent  Labs  04/11/17 1057  HGBA1C 5.0   CBG:  Recent Labs Lab 04/11/17 0243 04/12/17 0601  GLUCAP 131* 123*   Lipid Profile: No results for input(s): CHOL, HDL, LDLCALC, TRIG, CHOLHDL, LDLDIRECT in the last 72 hours. Thyroid Function Tests: No results for input(s): TSH, T4TOTAL, FREET4, T3FREE, THYROIDAB in the last 72 hours. Anemia Panel:  Recent Labs  04/11/17 1057  VITAMINB12 2,393*   Sepsis Labs:  Recent Labs Lab 04/11/17 0640 04/11/17 1057  LATICACIDVEN 1.2 1.2    Recent Results (from the past 240 hour(s))  Urine culture     Status: None   Collection Time: 04/11/17  6:41 AM  Result Value Ref Range Status   Specimen Description URINE, CATHETERIZED  Final   Special Requests NONE  Final   Culture NO GROWTH  Final   Report Status 04/12/2017 FINAL  Final  MRSA PCR Screening     Status: None   Collection Time: 04/11/17  9:51 AM  Result Value Ref Range Status   MRSA by PCR NEGATIVE NEGATIVE Final    Comment:        The GeneXpert MRSA Assay (FDA approved for NASAL specimens only), is one component of a comprehensive MRSA colonization surveillance program. It is not intended to diagnose MRSA infection nor to  guide or monitor treatment for MRSA infections.   Culture, blood (routine x 2)     Status: None (Preliminary result)   Collection Time: 04/12/17  7:26 AM  Result Value Ref Range Status   Specimen Description BLOOD RIGHT ARM  Final   Special Requests IN PEDIATRIC BOTTLE Blood Culture adequate volume  Final   Culture PENDING  Incomplete   Report Status PENDING  Incomplete       Radiology Studies: Dg Chest 2 View  Result Date: 04/11/2017 CLINICAL DATA:  Patient found unresponsive in bed. EXAM: CHEST  2 VIEW COMPARISON:  03/13/2017 FINDINGS: Stable mild cardiomegaly with aortic atherosclerosis. Left basilar atelectasis is noted. Interval clearing consolidation in the left lower lobe and probable small left effusion. No overt pulmonary edema. No acute osseous abnormality. IMPRESSION: No active cardiopulmonary disease. Stable cardiomegaly with aortic atherosclerosis. Left basilar atelectasis. Electronically Signed   By: Tollie Eth M.D.   On: 04/11/2017 03:49   Ct Head Wo Contrast  Result Date: 04/11/2017 CLINICAL DATA:  Found unresponsive in bed. EXAM: CT HEAD WITHOUT CONTRAST TECHNIQUE: Contiguous axial images were obtained from the base of the skull through the vertex without intravenous contrast. COMPARISON:  03/10/2017 FINDINGS: Motion degraded images limit assessment at the skullbase. BRAIN: There is chronic moderate sulcal and ventricular prominence consistent with superficial and central atrophy. No intraparenchymal hemorrhage, mass effect nor midline shift. Periventricular and subcortical white matter hypodensities consistent with chronic small vessel ischemic disease are identified. No acute large vascular territory infarcts. No abnormal extra-axial fluid collections. Basal cisterns are not effaced and midline. VASCULAR: Moderate calcific atherosclerosis of the carotid siphons and both vertebral arteries. SKULL: No skull fracture. No significant scalp soft tissue swelling. SINUSES/ORBITS:  The mastoid air-cells are clear. The included paranasal sinuses are well-aerated.The included ocular globes and orbital contents are non-suspicious. OTHER: None. IMPRESSION: Chronic stable cerebral atrophy with moderate degree of small vessel ischemia. No acute intracranial abnormality given limitations of motion artifact of images at the skull base. Electronically Signed   By: Tollie Eth M.D.   On: 04/11/2017 03:47      Scheduled Meds: . acetaminophen (TYLENOL) oral liquid 160 mg/5 mL  500 mg Oral Q8H  .  docusate sodium  100 mg Oral BID  . feeding supplement (ENSURE ENLIVE)  237 mL Oral BID BM  . mouth rinse  15 mL Mouth Rinse BID  . phenytoin (DILANTIN) IV  100 mg Intravenous QHS  . sodium chloride flush  3 mL Intravenous Q12H   Continuous Infusions: . potassium chloride       LOS: 1 day    Time spent: 50 minutes   Noralee Stain, DO [facility] [email or web address] Password [email or web address] 04/12/2017, 1:28 PM

## 2017-04-12 NOTE — Progress Notes (Signed)
Patient too lethargic to safely take PO medication at this time.  MD notified.  

## 2017-04-12 NOTE — Progress Notes (Signed)
Bladder scan performed,>745 cc. In and out catheterization done with #10F. Obtained 600 cc of amber urine. Patient tolerated it well. Kenzlee Fishburn, Drinda Butts, Charity fundraiser

## 2017-04-12 NOTE — Progress Notes (Signed)
PT Cancellation and Discharge Note   Patient Details Name: Kayla Collier MRN: [id number] DOB: [birth year]   Cancelled Treatment:    Reason Eval/Treat Not Completed: Other (comment) Orders acknowledged and chart reviewed. Per physician and NP goals of care are aimed at comfort and palliation with d/c plan of residential hospice. At this time, there are no acute physical therapy needs identified. Will sign off.   Will be happy to reconsult with new orders if any PT needs are identified.   Jenna Silver 04/12/2017, 2:13 PM  Van Clines, PT  Acute Rehabilitation Services Pager [phone] Office [phone]

## 2017-04-12 NOTE — Consult Note (Signed)
   [facility] CM Inpatient Consult   04/12/2017  Murrell Elizondo Feb 08, 1918 [id number]   Patient was assessed for re-admission in the Medicare ACO for potential community [facility] Care Management needs and chart review reveals that this patient is being followed by Hospice for likely residential hospice.  Patient will be active in the Hospice Care program.  No [facility] Care Management needs appropriate at this time.  For questions, please contact:  Charlesetta Shanks, RN BSN CCM [facility]  [phone] business mobile phone Toll free office [phone]

## 2017-04-12 NOTE — Progress Notes (Signed)
Patient's temp 102.2 rectally. Patient with rigors. Patient unable to take p.o medicine. Tylenol suppository given. Opyd,MD notified.Order received for [facility] x2. Will continue to monitor. Stephens Shreve, Drinda Butts, Charity fundraiser

## 2017-04-13 DIAGNOSIS — Z7189 Other specified counseling: Secondary | ICD-10-CM

## 2017-04-13 LAB — FOLATE RBC
FOLATE, HEMOLYSATE: 339.2 ng/mL
FOLATE, RBC: 1010 ng/mL (ref >498)
HEMATOCRIT: 33.6 % — AB (ref 34.0–46.6)

## 2017-04-13 MED ORDER — LORAZEPAM 2 MG/ML PO CONC: 0.5000 mg | Freq: Four times a day (QID) | ORAL | Status: DC

## 2017-04-13 MED ORDER — HALOPERIDOL LACTATE 2 MG/ML PO CONC
0.5000 mg | ORAL | Status: DC | PRN
  Filled 2017-04-13: qty 0.3

## 2017-04-13 MED ORDER — MORPHINE SULFATE (CONCENTRATE) 10 MG/0.5ML PO SOLN: 5.0000 mg | ORAL | Status: DC | PRN

## 2017-04-13 MED ORDER — GLYCOPYRROLATE 1 MG PO TABS
1.0000 mg | ORAL_TABLET | ORAL | Status: DC | PRN
  Filled 2017-04-13: qty 1

## 2017-04-13 MED ORDER — GLYCOPYRROLATE 0.2 MG/ML IJ SOLN
0.2000 mg | INTRAMUSCULAR | Status: DC | PRN
  Filled 2017-04-13: qty 1

## 2017-04-13 MED ORDER — MORPHINE SULFATE (PF) 2 MG/ML IV SOLN
1.0000 mg | INTRAVENOUS | Status: DC | PRN
  Filled 2017-04-13: qty 1

## 2017-04-13 MED ORDER — HALOPERIDOL 0.5 MG PO TABS
0.5000 mg | ORAL_TABLET | ORAL | Status: DC | PRN
  Filled 2017-04-13: qty 1

## 2017-04-13 MED ORDER — HALOPERIDOL LACTATE 5 MG/ML IJ SOLN: 0.5000 mg | INTRAMUSCULAR | Status: DC | PRN

## 2017-04-13 MED ORDER — MORPHINE SULFATE (PF) 2 MG/ML IV SOLN
1.0000 mg | INTRAVENOUS | Status: DC
  Administered 2017-04-13 (×2): 1 mg via INTRAVENOUS
  Filled 2017-04-13: qty 1

## 2017-04-13 MED ORDER — MORPHINE SULFATE (PF) 2 MG/ML IV SOLN: 1.0000 mg | INTRAVENOUS | Status: DC | PRN

## 2017-04-13 MED ORDER — LORAZEPAM 2 MG/ML PO CONC
0.5000 mg | Freq: Four times a day (QID) | ORAL | Status: DC
  Administered 2017-04-13: 0.5 mg via SUBLINGUAL
  Filled 2017-04-13: qty 1

## 2017-04-13 NOTE — Progress Notes (Signed)
[facility] available to day. Paper work completed at bedside with daughter Misty Stanley.  Please fax discharge summary to [phone].  RN please call report to [phone].  CSW Erie Noe aware of request to transport asap.  Thank you,  Forrestine Him, LCSW [phone]

## 2017-04-13 NOTE — Discharge Summary (Signed)
Physician Discharge Summary  Kayla Collier EYC:[id number] DOB: [birth year] DOA: 04/11/2017  PCP: Ileana Ladd, MD  Admit date: 04/11/2017 Discharge date: 04/13/2017  Admitted From: [facility] Disposition:  [facility]   Discharge Condition: Terminal CODE STATUS: DNR  Diet recommendation: Regular, comfort feed   Brief/Interim Summary: From H&P by Kayla Smothers NP: [name] is a very pleasant 83 y.o. female with medical history significant for dementia, hypertension, seizures, gangrene of the left foot with atherosclerosis recently underwent aortogram with bilateral runoff per vascular presents to the emergency department from a facility with the chief complaint generalized weakness and lethargy. Initial evaluation reveals acute respiratory failure with hypoxia, urinary tract infection. Information is obtained from the daughter who is at the bedside and the chart. Daughter reports facility called EMS when patient was found responsive around 1 AM this morning. Unknown facility when she was last seen normal. EMS reports she had a fixed gaze did not respond verbally but did withdraw from pain. At that time her oxygen saturation was noted to be 88% on room air. Patient was recently discharged from the hospital after arteriogram and then 2 weeks prior to that underwent repair of hip fracture. Caregiver reports patient has gradually become more lethargic has been getting pain medicine for the discomfort to her left foot. Daughter reports patient has a propensity for urinary retention and during her last hospitalization required a Foley catheter. No reports of any recent fever nausea vomiting diarrhea. Does report over the last 2 days her oral intake has declined. No complaints of chest pain palpitation shortness of breath. No complaints of abdominal pain abdominal distention.  Patient was initially treated for urinary tract infection with Rocephin. However, urine culture resulted negative and  rocephin stopped. Patient's respiratory failure resolved. Patient continued to have fevers, most likely related to her left lower leg gangrene vs bacteremia. She had recently completed course of Augmentin. Previously, vascular sx recommended above-the-knee amputation which patient had refused. Blood cultures have been ordered and pending. Patient continued to deteriorate, mental status abnormal. Palliative care was consulted for end-of-life discussion. Family decided on residential hospice to transition her to comfort care.   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 Urine culture is negative. Rocephin discontinued. -Recently completed course of Augmentin. Underwent aortogram with bilateral runoff. Chart review indicates patient had a unreconstructable vessels and therefore vascular recommended above-the-knee amputation which patient refused. -Blood culture pending  Acute encephalopathy  -Could be related to infectious process in the setting of frequent pain medicine in elderly patient with recent repair of right hip fracture as well as aortogram with bilateral runoff per vascular last week and gangrenous foot. CT of the head without acute abnormality. No metabolic derangement. EKG without acute changes. -Monitor   Pyuria -Urine culture negative, stop  Rocephin   Hypertension -Has been refusing oral medications  Right hip fracture, status post pertrochanteric fracture  with intramedullary implant on 5/10 -Had been undergoing physical therapy at facility  Seizure -Has history of seizure activity. No recent seizures -Patient refusing oral medications  Dementia -At her baseline she is communicative and able to make her wants and needs known  Chronic normocytic anemia -Baseline Hgb 9  -Stable   Hypokalemia -Replaced   Goals of care -Palliative care consulted -After discussion with palliative care team, SW, and myself, family has decided to enroll patient to hospice/end of life/comfort care.     Discharge Instructions   Allergies as of 04/13/2017   No Known Allergies     Medication List    STOP taking these medications   acetaminophen 500 MG tablet Commonly known as:  TYLENOL   amLODipine 5 MG tablet Commonly known as:  NORVASC   aspirin EC 81 MG tablet   atenolol 50 MG tablet Commonly known as:  TENORMIN   cholecalciferol 1000 units tablet Commonly known as:  VITAMIN D   docusate sodium 100 MG capsule Commonly known as:  COLACE   enoxaparin 40 MG/0.4ML injection Commonly known as:  LOVENOX   feeding supplement (PRO-STAT SUGAR FREE 64) Liqd   gabapentin 100 MG capsule Commonly known as:  NEURONTIN   levofloxacin 500 MG tablet Commonly known as:  LEVAQUIN   methocarbamol 500 MG tablet Commonly known as:  ROBAXIN   phenytoin 100 MG ER capsule Commonly known as:  DILANTIN   phenytoin 30 MG ER capsule Commonly known as:  DILANTIN   polyethylene glycol packet Commonly known as:  MIRALAX / GLYCOLAX   potassium chloride 10 MEQ CR capsule Commonly known as:  MICRO-K   TART CHERRY ADVANCED Caps   traMADol 50 MG tablet Commonly known as:  ULTRAM   UNABLE TO FIND   vitamin B-12 1000 MCG tablet Commonly known as:  CYANOCOBALAMIN   vitamin E 400 UNIT capsule Generic drug:  vitamin E        No Known Allergies  Consultations:  Palliative Care    Procedures/Studies: Dg Chest 2 View  Result Date: 04/11/2017 CLINICAL DATA:  Patient found unresponsive in bed. EXAM: CHEST  2 VIEW COMPARISON:  03/13/2017 FINDINGS: Stable mild cardiomegaly with aortic atherosclerosis. Left basilar atelectasis is noted. Interval clearing consolidation in the left lower lobe and probable small left effusion. No overt pulmonary edema. No acute osseous abnormality. IMPRESSION: No active cardiopulmonary disease. Stable cardiomegaly with aortic atherosclerosis. Left basilar atelectasis. Electronically Signed   By: Tollie Eth M.D.   On: 04/11/2017 03:49   Ct Head Wo Contrast  Result Date: 04/11/2017 CLINICAL DATA:  Found unresponsive in bed. EXAM: CT HEAD WITHOUT CONTRAST TECHNIQUE: Contiguous axial images were obtained from the base of the skull through the vertex without intravenous contrast. COMPARISON:  03/10/2017 FINDINGS: Motion degraded images limit assessment at the skullbase. BRAIN: There is chronic moderate sulcal and ventricular prominence consistent with superficial and central atrophy. No intraparenchymal hemorrhage, mass effect nor midline shift. Periventricular and subcortical white matter hypodensities consistent with chronic small vessel ischemic disease are identified. No acute large vascular territory infarcts. No abnormal extra-axial fluid collections. Basal cisterns are not effaced and midline. VASCULAR: Moderate calcific atherosclerosis of the carotid siphons and both vertebral arteries. SKULL: No skull fracture. No significant scalp soft tissue swelling. SINUSES/ORBITS: The mastoid air-cells are clear. The included paranasal sinuses are well-aerated.The included ocular globes and orbital contents are non-suspicious. OTHER: None. IMPRESSION: Chronic stable cerebral atrophy with moderate degree of small vessel ischemia. No acute intracranial abnormality given limitations of  motion artifact  of images at the skull base. Electronically Signed   By: Tollie Eth M.D.   On: 04/11/2017 03:47   Xr Hip Unilat W Or W/o Pelvis 2-3 Views Right  Result Date: 03/25/2017 Stable fixation and fracture alignment     Discharge Exam: Vitals:   04/13/17 0525 04/13/17 1016  BP: (!) 152/66 (!) 105/54  Pulse: 96 (!) 102  Resp: 18 17  Temp: 99.5 F (37.5 C) 98.2 F (36.8 C)   Vitals:   04/12/17 2140 04/12/17 2323 04/13/17 0525 04/13/17 1016  BP: (!) 165/54  (!) 152/66 (!) 105/54  Pulse: 92  96 (!) 102  Resp: 18  18 17   Temp: 98.9 F (37.2 C)  99.5 F (37.5 C) 98.2 F (36.8 C)  TempSrc: Oral  Axillary Axillary  SpO2: 95%  96% 100%  Weight:  41.3 kg (91 lb)    Height:        General: Pt is moaning in bed, not alert, cachectic  Cardiovascular: RRR, S1/S2 +, no rubs, no gallops Respiratory: CTA bilaterally, no wheezing, no rhonchi Abdominal: Soft, NT, ND, bowel sounds + Extremities: no edema, no cyanosis, very thin    The results of significant diagnostics from this hospitalization (including imaging, microbiology, ancillary and laboratory) are listed below for reference.     Microbiology: Recent Results (from the past 240 hour(s))  Culture, blood (single)     Status: None (Preliminary result)   Collection Time: 04/11/17  6:40 AM  Result Value Ref Range Status   Specimen Description BLOOD LEFT HAND  Final   Special Requests   Final    BOTTLES DRAWN AEROBIC ONLY Blood Culture adequate volume   Culture NO GROWTH 1 DAY  Final   Report Status PENDING  Incomplete  Urine culture     Status: None   Collection Time: 04/11/17  6:41 AM  Result Value Ref Range Status   Specimen Description URINE, CATHETERIZED  Final   Special Requests NONE  Final   Culture NO GROWTH  Final   Report Status 04/12/2017 FINAL  Final  MRSA PCR Screening     Status: None   Collection Time: 04/11/17  9:51 AM  Result Value Ref Range Status   MRSA by PCR NEGATIVE NEGATIVE Final    Comment:        The  GeneXpert MRSA Assay (FDA approved for NASAL specimens only), is one component of a comprehensive MRSA colonization surveillance program. It is not intended to diagnose MRSA infection nor to guide or monitor treatment for MRSA infections.   Culture, blood (routine x 2)     Status: None (Preliminary result)   Collection Time: 04/12/17  7:26 AM  Result Value Ref Range Status   Specimen Description BLOOD RIGHT ARM  Final   Special Requests IN PEDIATRIC BOTTLE Blood Culture adequate volume  Final   Culture PENDING  Incomplete   Report Status PENDING  Incomplete     Labs: BNP (last 3 results) No results for input(s): BNP in the last 8760 hours. Basic Metabolic Panel:  Recent Labs Lab 04/09/17 0740 04/11/17 0248 04/11/17 1057 04/12/17 0455  NA 135 136  --  135  K 3.7 3.6  --  3.2*  CL 98* 101  --  100*  CO2  --  22  --  26  GLUCOSE 99 157*  --  128*  BUN 25* 23*  --  11  CREATININE 0.40* 0.74  --  0.49  CALCIUM  --  9.3  --  9.0  MG  --   --  1.7  --   PHOS  --   --  2.5  --    Liver Function Tests:  Recent Labs Lab 04/11/17 0248  AST 35  ALT 20  ALKPHOS 152*  BILITOT 0.6  PROT 6.6  ALBUMIN 2.7*   No results for input(s): LIPASE, AMYLASE in the last 168 hours. No results for input(s): AMMONIA in the last 168 hours. CBC:  Recent Labs Lab 04/09/17 0740 04/11/17 0248 04/12/17 0455  WBC  --  14.8* 12.1*  HGB 12.2 9.3* 11.2*  HCT 36.0 28.8* 34.2*  MCV  --  92.0 90.2  PLT  --  392 383   Cardiac Enzymes: No results for input(s): CKTOTAL, CKMB, CKMBINDEX, TROPONINI in the last 168 hours. BNP: Invalid input(s): POCBNP CBG:  Recent Labs Lab 04/11/17 0243 04/12/17 0601  GLUCAP 131* 123*   D-Dimer No results for input(s): DDIMER in the last 72 hours. Hgb A1c  Recent Labs  04/11/17 1057  HGBA1C 5.0   Lipid Profile No results for input(s): CHOL, HDL, LDLCALC, TRIG, CHOLHDL, LDLDIRECT in the last 72 hours. Thyroid function studies No results for  input(s): TSH, T4TOTAL, T3FREE, THYROIDAB in the last 72 hours.  Invalid input(s): FREET3 Anemia work up  Recent Labs  04/11/17 1057  VITAMINB12 2,393*   Urinalysis    Component Value Date/Time   COLORURINE YELLOW 04/11/2017 0308   APPEARANCEUR TURBID (A) 04/11/2017 0308   LABSPEC 1.026 04/11/2017 0308   PHURINE 5.0 04/11/2017 0308   GLUCOSEU NEGATIVE 04/11/2017 0308   HGBUR SMALL (A) 04/11/2017 0308   BILIRUBINUR NEGATIVE 04/11/2017 0308   KETONESUR NEGATIVE 04/11/2017 0308   PROTEINUR 100 (A) 04/11/2017 0308   UROBILINOGEN 0.2 08/23/2007 1357   NITRITE POSITIVE (A) 04/11/2017 0308   LEUKOCYTESUR LARGE (A) 04/11/2017 0308   Sepsis Labs Invalid input(s): PROCALCITONIN,  WBC,  LACTICIDVEN Microbiology Recent Results (from the past 240 hour(s))  Culture, blood (single)     Status: None (Preliminary result)   Collection Time: 04/11/17  6:40 AM  Result Value Ref Range Status   Specimen Description BLOOD LEFT HAND  Final   Special Requests   Final    BOTTLES DRAWN AEROBIC ONLY Blood Culture adequate volume   Culture NO GROWTH 1 DAY  Final   Report Status PENDING  Incomplete  Urine culture     Status: None   Collection Time: 04/11/17  6:41 AM  Result Value Ref Range Status   Specimen Description URINE, CATHETERIZED  Final   Special Requests NONE  Final   Culture NO GROWTH  Final   Report Status 04/12/2017 FINAL  Final  MRSA PCR Screening     Status: None   Collection Time: 04/11/17  9:51 AM  Result Value Ref Range Status   MRSA by PCR NEGATIVE NEGATIVE Final    Comment:        The GeneXpert MRSA Assay (FDA approved for NASAL specimens only), is one component of a comprehensive MRSA colonization surveillance program. It is not intended to diagnose MRSA infection nor to guide or monitor treatment for MRSA infections.   Culture, blood (routine x 2)     Status: None (Preliminary result)   Collection Time: 04/12/17  7:26 AM  Result Value Ref Range Status   Specimen  Description BLOOD RIGHT ARM  Final   Special Requests IN PEDIATRIC BOTTLE Blood Culture adequate volume  Final   Culture PENDING  Incomplete   Report Status PENDING  Incomplete     Time coordinating discharge: 45 minutes  SIGNED:  Noralee Stain, DO [facility] Pager [phone]  If 7PM-7AM, please contact night-coverage [email or web address] Password [facility] 04/13/2017, 12:40 PM

## 2017-04-13 NOTE — Progress Notes (Signed)
Report given to Sales executive at [facility].

## 2017-04-13 NOTE — Progress Notes (Signed)
.km                                                                                                                                                                                                           Daily Progress Note   Patient Name: Kayla Collier       Date: 04/13/2017 DOB: [birth year]  Age: 83 y.o. MRN#: [id number] Attending Physician: Leda Min* Primary Care Physician: Ileana Ladd, MD Admit Date: 04/11/2017  Reason for Consultation/Follow-up: Establishing goals of care, Non pain symptom management, Pain control and Terminal Care  Subjective: Patient in bed, awake. Patient's two daughters and granddaughter at bedside. She appears uncomfortable. Her brow is furrowed. Continues with left sided facial and extremity tremors. Misty Stanley tells me yesterday Kayla Collier was quite delirious, calling out, pulling her clothes off. PRN lorazepam seemed to help her delirium and the tremors.  Discussed with Misty Stanley and her sister who arrived last night from [place] the complete meaning and philosophy of comfort care- embracing that a patient has an irreversible disease process, and providing interventions aimed only at treating symptoms. No of curative intent. We discussed comfort feeding, and the role of artificial IV hydration at end of life. We discussed various comfort medications v other medications that are life prolonging.  They are concerned about Kayla Collier's tremors and about her being in pain and not receiving medications for these. I will schedule lorazepam and morphine to ensure she gets these comfort medications. Family verbalized appreciation. While I was with family, Carley Hammed contacted them that a bed is available at [facility] for Kayla Collier. She will hopefull d/c there today.   Review of Systems  Unable to perform ROS: Dementia    Length of Stay: 2  Current Medications: Scheduled Meds:  . acetaminophen (TYLENOL) oral liquid 160 mg/5 mL  500 mg Oral Q8H  . docusate sodium  100 mg Oral  BID  . feeding supplement (ENSURE ENLIVE)  237 mL Oral BID BM  . LORazepam  0.5 mg Sublingual Q6H  . mouth rinse  15 mL Mouth Rinse BID  .  morphine injection  1 mg Intravenous Q4H  . phenytoin (DILANTIN) IV  100 mg Intravenous QHS  . sodium chloride flush  3 mL Intravenous Q12H    Continuous Infusions:   PRN Meds: acetaminophen **OR** acetaminophen, albuterol, glycopyrrolate **OR** glycopyrrolate **OR** glycopyrrolate, haloperidol **OR** haloperidol **OR** haloperidol lactate, hydrALAZINE, morphine injection, morphine CONCENTRATE **OR** morphine CONCENTRATE, ondansetron **OR** ondansetron (ZOFRAN) IV, polyethylene glycol  Physical Exam  Constitutional:  cachetic  Cardiovascular: Normal rate and regular rhythm.  Tachycardic   Pulmonary/Chest: No respiratory distress.  Neurological:  Tremors L sided  Skin: Skin is warm and dry.  Nursing note and vitals reviewed.           Vital Signs: BP (!) 105/54 (BP Location: Left Arm)   Pulse (!) 102   Temp 98.2 F (36.8 C) (Axillary)   Resp 17   Ht 4\' 11"  (1.499 m)   Wt 41.3 kg (91 lb)   SpO2 100%   BMI 18.38 kg/m  SpO2: SpO2: 100 % O2 Device: O2 Device: Not Delivered O2 Flow Rate: O2 Flow Rate (L/min): 4 L/min  Intake/output summary:   Intake/Output Summary (Last 24 hours) at 04/13/17 1054 Last data filed at 04/13/17 1015  Gross per 24 hour  Intake              620 ml  Output              600 ml  Net               20 ml   LBM: Last BM Date: 04/12/17 Baseline Weight: Weight: 40.8 kg (90 lb) Most recent weight: Weight: 41.3 kg (91 lb)       Palliative Assessment/Data: PPS: 10%    Flowsheet Rows     Most Recent Value  Intake Tab  Referral Department  Hospitalist  Unit at Time of Referral  Med/Surg Unit  Palliative Care Primary Diagnosis  Sepsis/Infectious Disease  Date Notified  04/11/17  Palliative Care Type  Return patient Palliative Care  Reason for referral  Pain, Non-pain Symptom, Clarify Goals of Care,  Counsel Regarding Hospice  Date of Admission  04/11/17  Date first seen by Palliative Care  04/11/17  # of days Palliative referral response time  0 Day(s)  # of days IP prior to Palliative referral  0  Clinical Assessment  Palliative Performance Scale Score  30%  Pain Max last 24 hours  Not able to report  Pain Min Last 24 hours  Not able to report  Dyspnea Max Last 24 Hours  Not able to report  Dyspnea Min Last 24 hours  Not able to report  Nausea Max Last 24 Hours  Not able to report  Nausea Min Last 24 Hours  Not able to report  Anxiety Max Last 24 Hours  Not able to report  Anxiety Min Last 24 Hours  Not able to report  Other Max Last 24 Hours  Not able to report  Psychosocial & Spiritual Assessment  Palliative Care Outcomes  Patient/Family meeting held?  Yes  Who was at the meeting?  daughter Misty Stanley  Palliative Care Outcomes  Improved pain interventions, Counseled regarding hospice, Provided psychosocial or spiritual support, Transitioned to hospice, Clarified goals of care  Patient/Family wishes: Interventions discontinued/not started   Mechanical Ventilation, BiPAP, Hemodialysis, Transfusion, Vasopressors, Trach, NIPPV, Tube feedings/TPN, PEG  Palliative Care follow-up planned  Yes, Facility      Patient Active Problem List   Diagnosis Date Noted  . Pyelonephritis 04/11/2017  . Acute encephalopathy 04/11/2017  . Acute respiratory failure (HCC) 04/11/2017  . Anemia 04/11/2017  . Urinary retention 04/11/2017  . Pressure injury of skin 04/11/2017  . Palliative care by specialist   . Closed comminuted intertrochanteric fracture of proximal end of right femur (HCC) 03/10/2017  . Alzheimer's type dementia with late onset without behavioral disturbance 03/10/2017  . Closed fracture of trochanter of right femur (HCC)   . Goals of care, counseling/discussion   . Palliative  care encounter   . 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 Gangrene of  foot (HCC) 02/19/2017  . Essential hypertension 02/19/2017  . Anxiety 02/19/2017  . Seizures (HCC) 02/19/2017    Palliative Care Assessment & Plan   Patient Profile: 83 y.o. female  with past medical history of Vascular dementia, hypertension, seizures, gangrene of the left foot, recent aortogram with bilateral runoff , recent hip fracture admitted on 04/11/2017 with lethargy, weakness. Patient has been seen by palliative medicine services in May 2018. She is also been receiving palliative medicine consults when she was living in the home with her daughter. Since her hip fracture she has been living in a skilled nursing facility, initially for rehabilitation but now due to her vascular condition, gangrenous left foot, she is no longer a rehabilitation candidate and is now residing there. Patient was found to have another urinary tract infection. Palliative medicine consulted for GOC and symptom management. Initial consult on 6/10 at which time decision was made to seek residential hospice, continue IV fluids and antibiotics during hospitalization only. We have now transitioned to comfort measures only.  Assessment/Recommendations/Plan   Lorazepam concentrated solution .5mg  sublingual every 6 hours  Morphine 1mg  IV every 4 hours  Morphine 1 mg IV every prn  I have ordered comfort medications in preparation for d/c to [facility] as well that can be prescribed by discharging physician- recommend continuing lorazepam as ordered, morphine SL 5mg  q 4 hrs, haldol .5mg  SL every 4hr prn, and robinul 1mg  po every 4 hr prn excessive secretions  Goals of Care and Additional Recommendations:  Limitations on Scope of Treatment: Minimize Medications, Initiate Comfort Feeding, No Artificial Feeding, No Diagnostics and No Lab Draws  Code Status:  DNR  Prognosis:   < 2 weeks due to little to no po intake, sepsis d/t gangrenous LLE, now transitioned to full comfort care at discharge  Discharge  Planning:  Hospice facility pending evaluation and bed availability  Care plan was discussed with patient's daughter, .  Thank you for allowing the Palliative Medicine Team to assist in the care of this patient.   Time In: 1000 Time Out: 1045   Total Time 45 minutes Prolonged Time Billed No      Greater than 50%  of this time was spent counseling and coordinating care related to the above assessment and plan.  [facility], AGNP-C Palliative Medicine   Please contact Palliative Medicine Team phone at [phone] for questions and concerns.

## 2017-04-13 NOTE — Clinical Social Work Note (Signed)
Patient will discharge to to [facility] today, transported by ambulance. Daughter, Fayne Norrie and other family at the bedside. Admissions paperwork completed by daughter with [facility]'s LCSW, Forrestine Him.  Genelle Bal, MSW, LCSW Licensed Clinical Social Worker Clinical Social Work Department [employer] [phone]

## 2017-04-13 NOTE — Progress Notes (Signed)
Nutrition Brief Note  Chart reviewed. Pt triggered for assessment due to MST 2 or greater Pt now transitioning to comfort care.  No further nutrition interventions warranted at this time.  Please re-consult as needed.   Romelle Starcher MS, RD, LDN [phone] Pager  [phone] Weekend/On-Call Pager

## 2017-04-16 LAB — CULTURE, BLOOD (SINGLE)
Culture: NO GROWTH
Special Requests: ADEQUATE

## 2017-04-17 LAB — CULTURE, BLOOD (ROUTINE X 2)
Culture: NO GROWTH
SPECIAL REQUESTS: ADEQUATE

## 2017-04-18 DIAGNOSIS — Z7189 Other specified counseling: Secondary | ICD-10-CM

## 2017-04-18 DIAGNOSIS — R4182 Altered mental status, unspecified: Secondary | ICD-10-CM

## 2017-04-22 ENCOUNTER — Ambulatory Visit (INDEPENDENT_AMBULATORY_CARE_PROVIDER_SITE_OTHER): Payer: Medicare Other | Admitting: Orthopaedic Surgery

## 2017-05-02 DEATH — deceased

## 2018-08-17 IMAGING — DX DG FOOT COMPLETE 3+V*L*
3 series · 3 of 3 positions shown · non-contrast
Comparison: None.

CLINICAL DATA: Pain in the left great toe for the last several
weeks. Some drainage.

EXAM:
LEFT FOOT - COMPLETE 3+ VIEW

[foot ap]
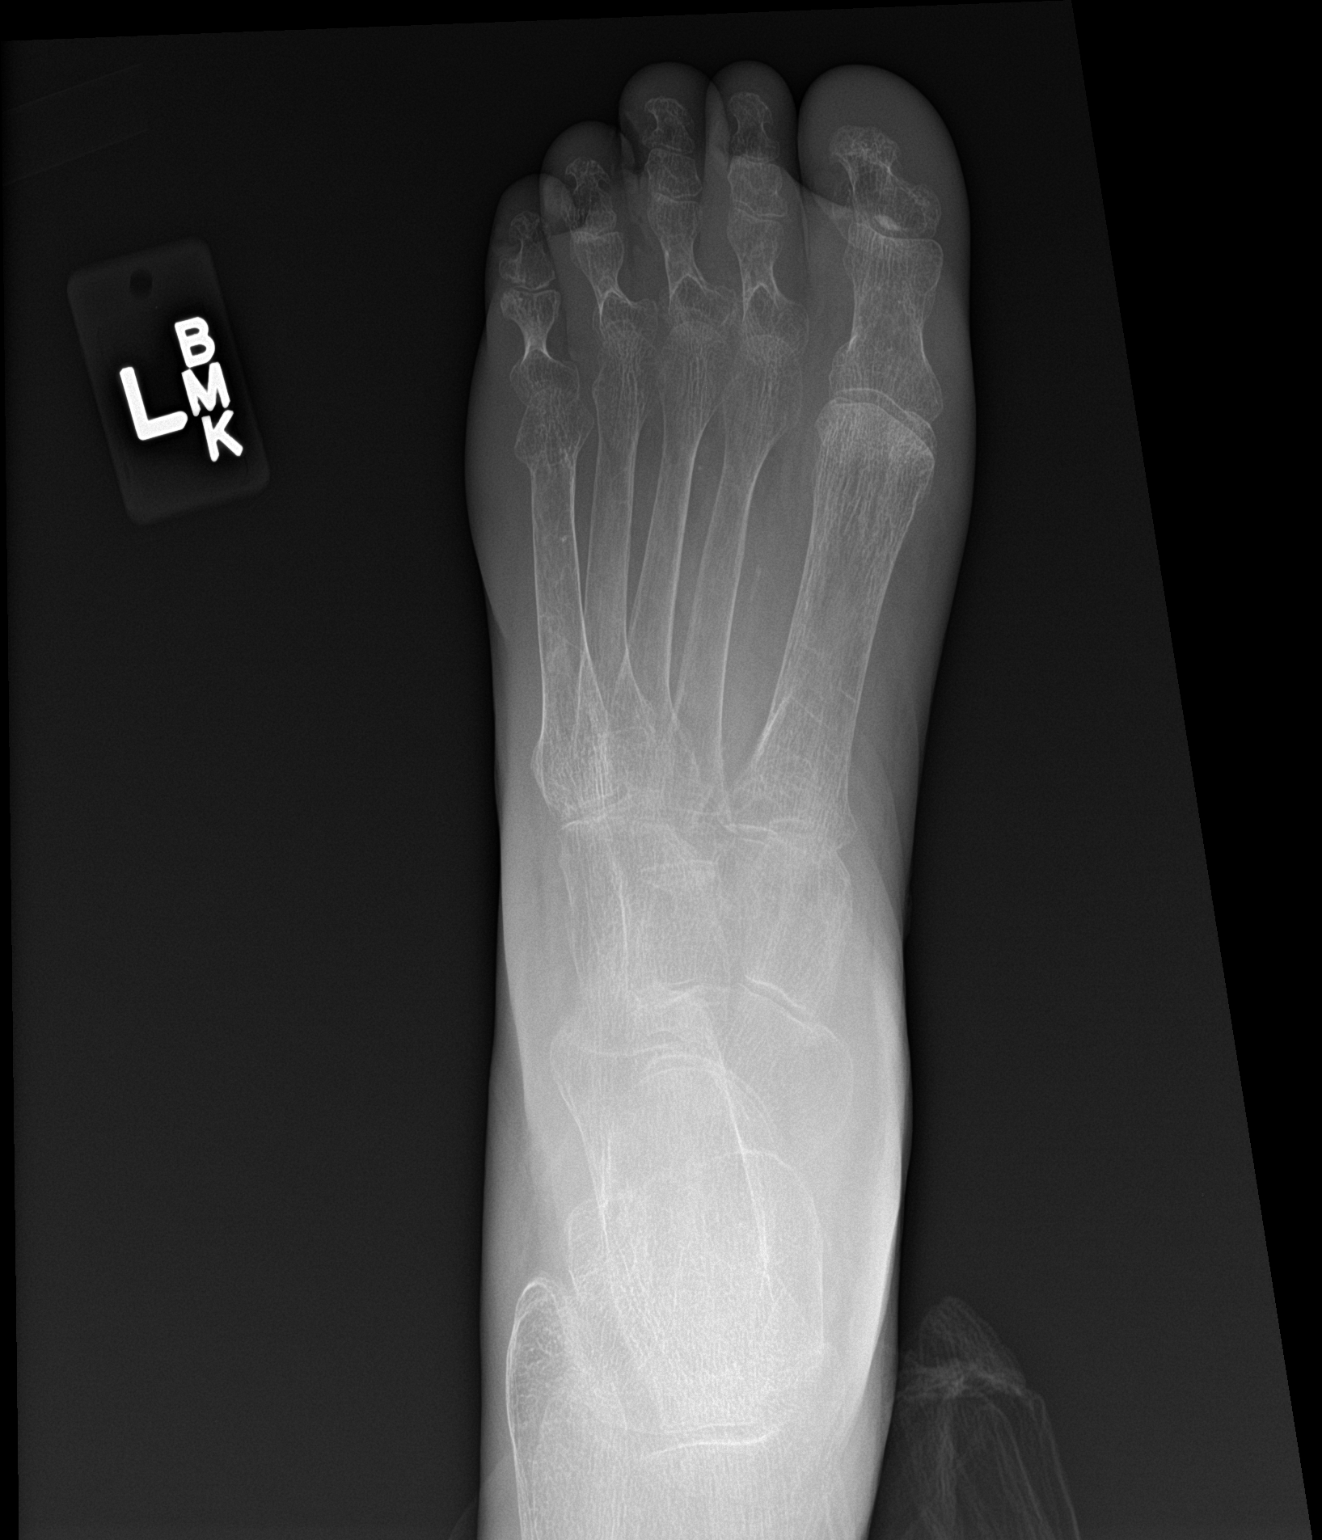

[foot obl]
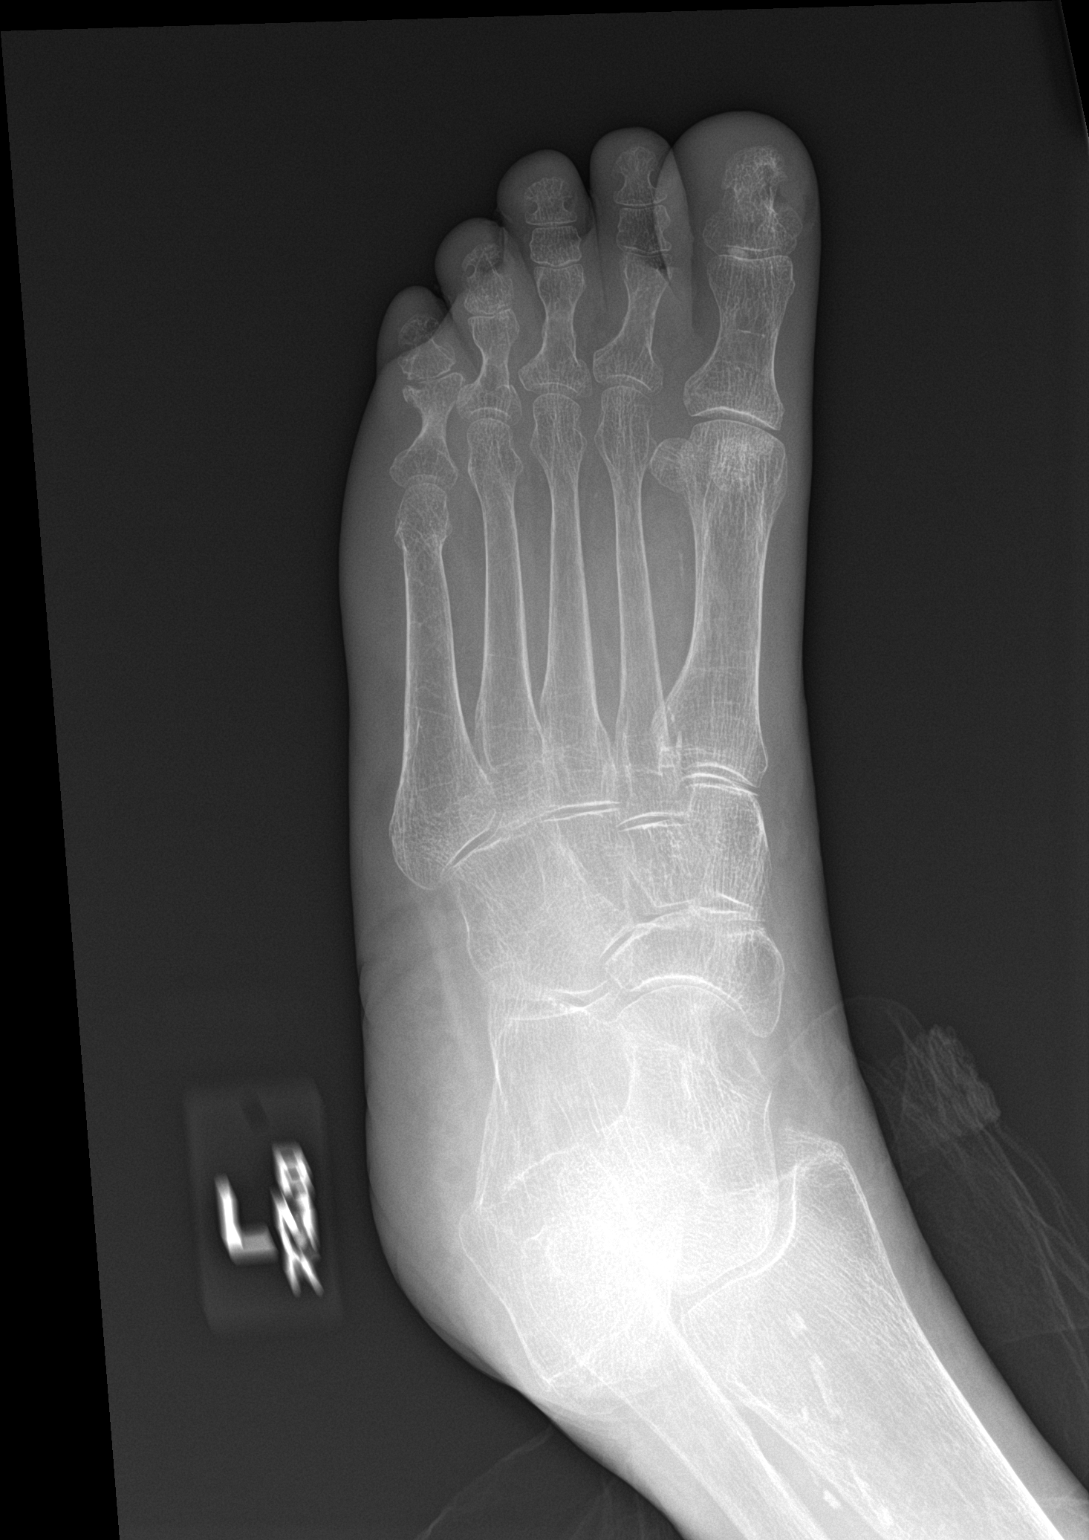

[foot lat]
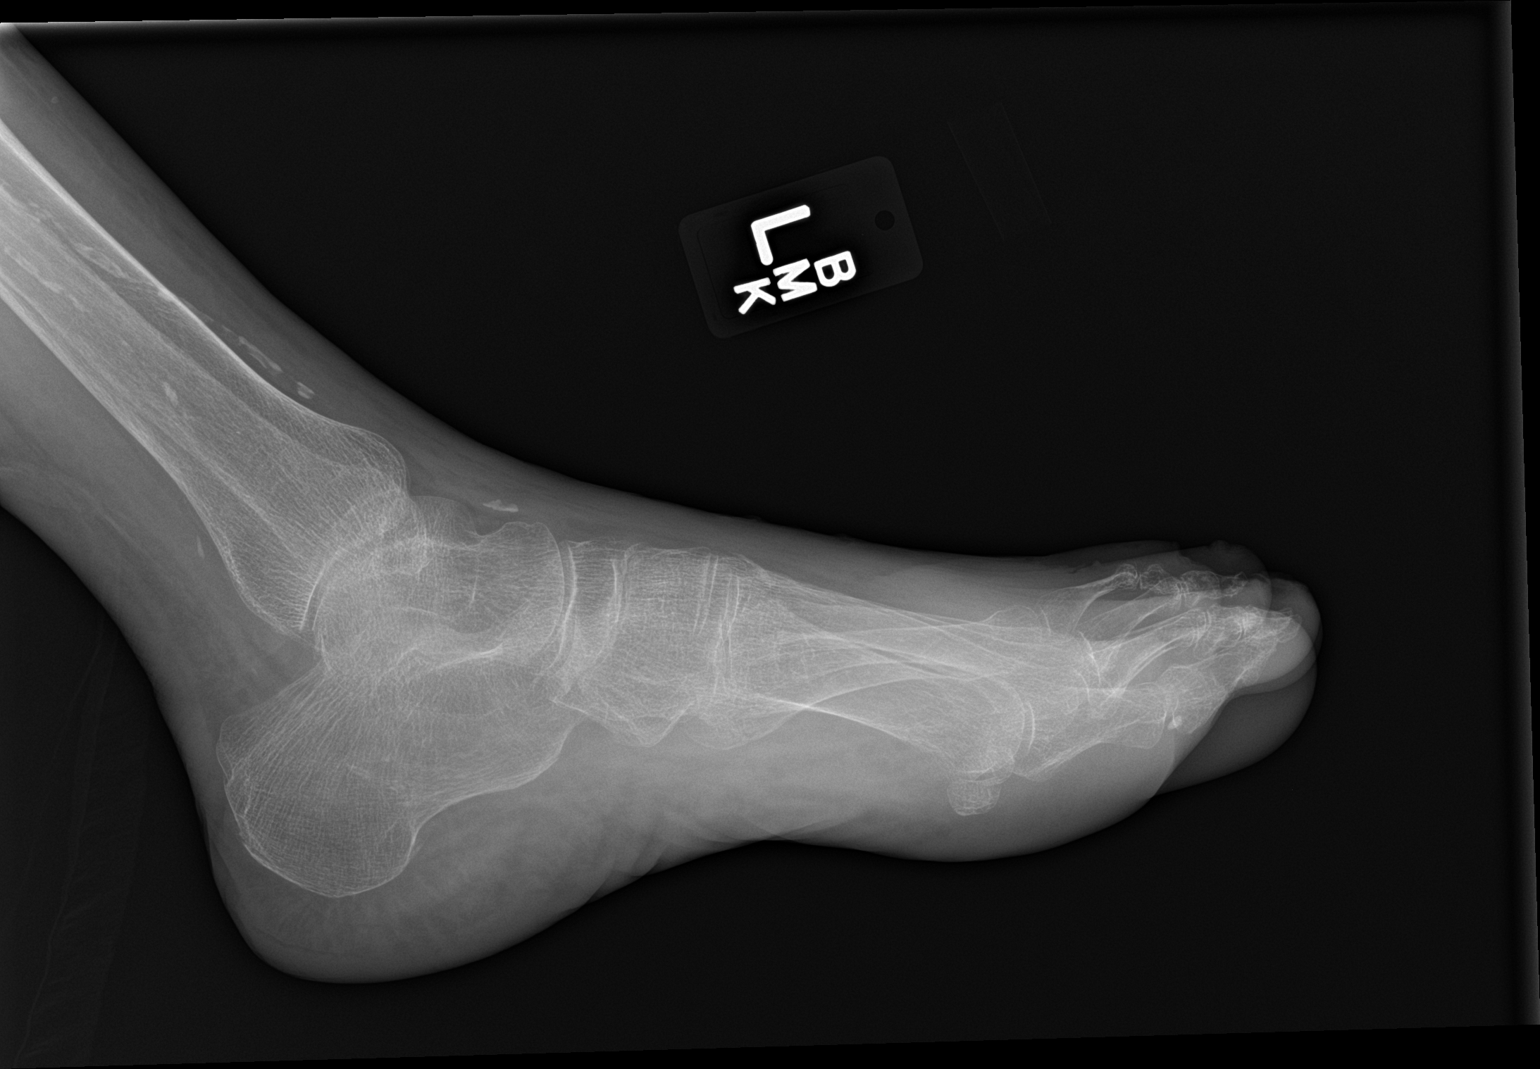

[3 of 3 positions shown; findings below may reference images not displayed]

FINDINGS: Distal all soft tissue swelling. No evidence of erosion or lytic
change that would allow diagnosis of osteomyelitis by plain
radiography. The remainder of the foot is negative except for some
chronic arthritic change of the inter phalangeal joint of the small
toe.
IMPRESSION: Soft tissue swelling of the distal great toe. No underlying bone
abnormality seen.

## 2018-09-05 IMAGING — CR DG HIP (WITH OR WITHOUT PELVIS) 2-3V*R*
3 series · 3 of 3 positions shown · non-contrast
Comparison: None.

CLINICAL DATA: Status post fall, with right hip deformity. Initial
encounter.

EXAM:
DG HIP (WITH OR WITHOUT PELVIS) 2-3V RIGHT

[w hip lat right]
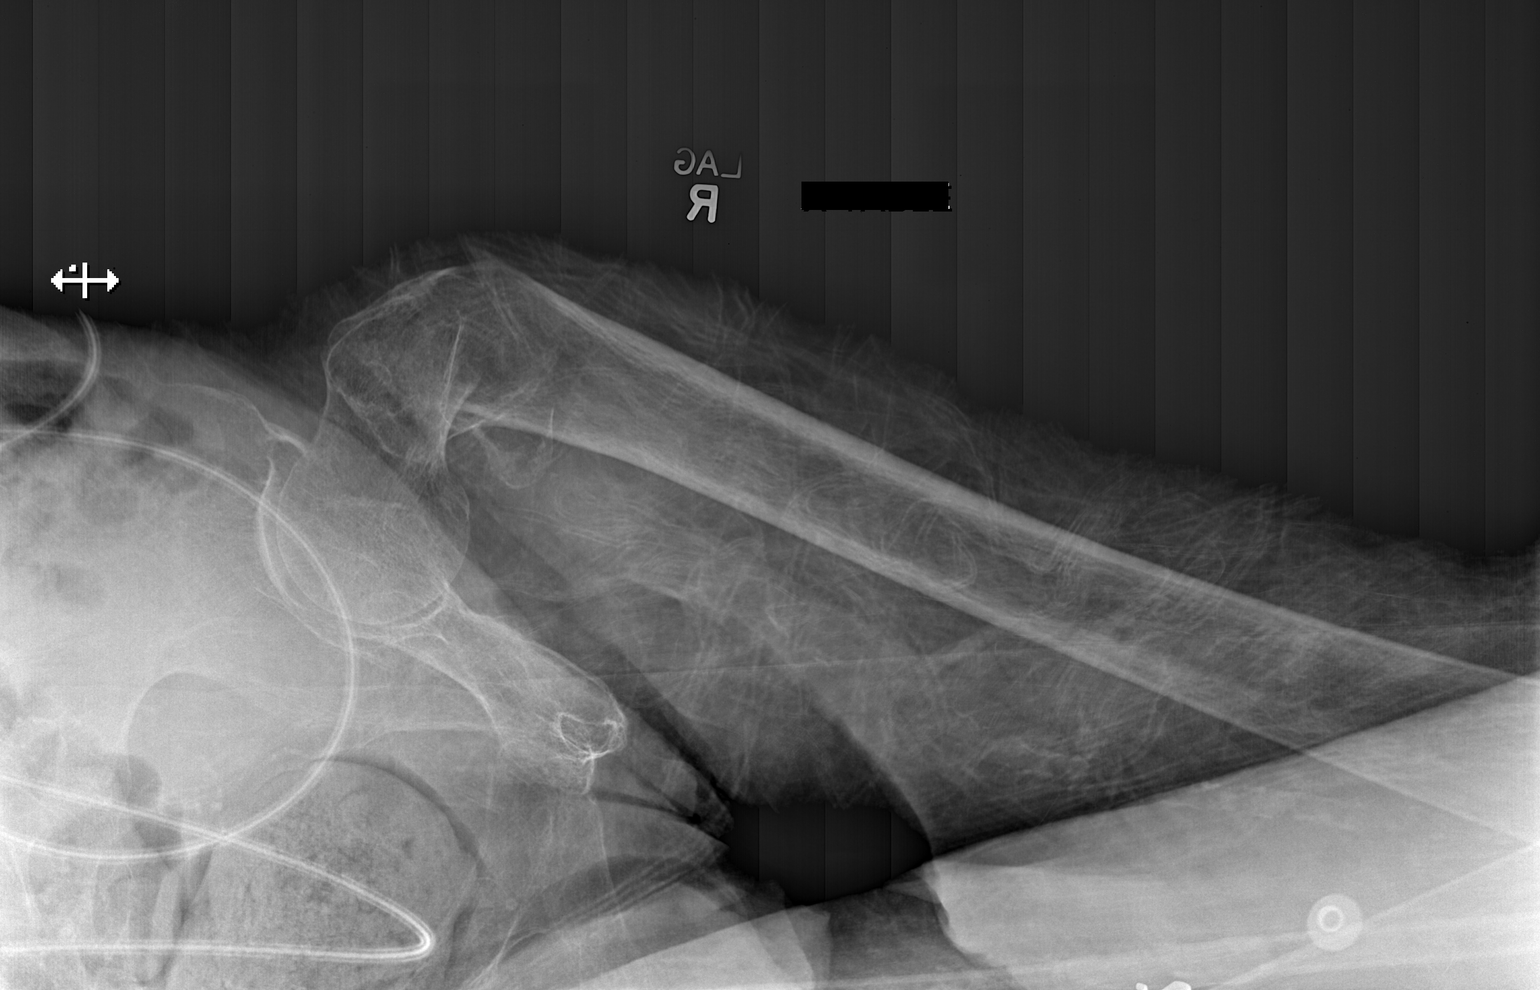

[x hip ap right]
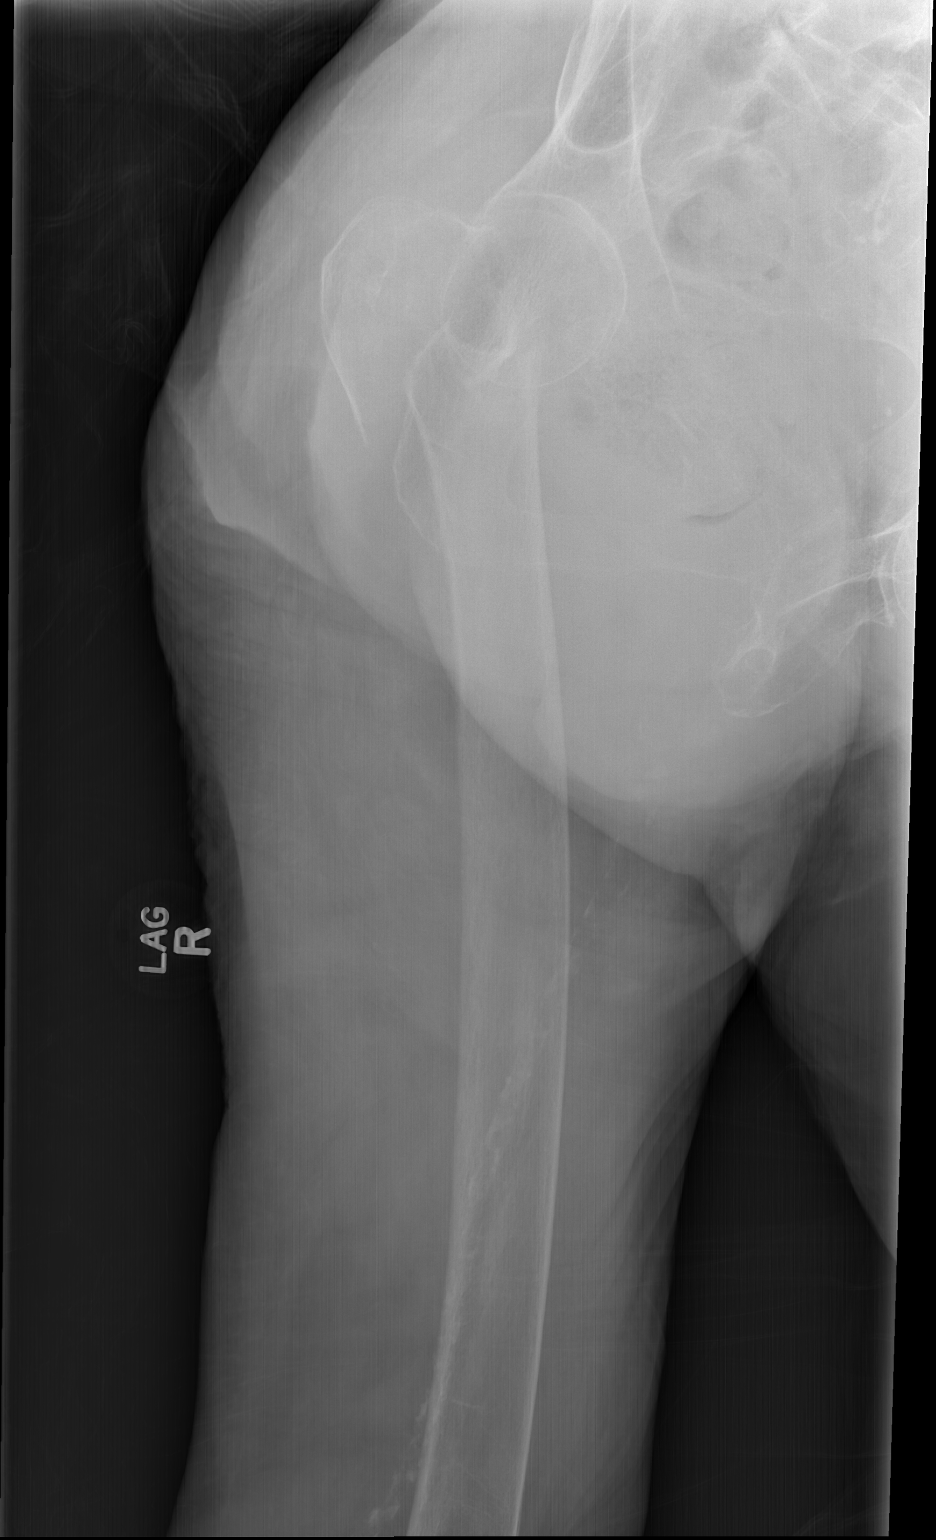

[x pelvis]
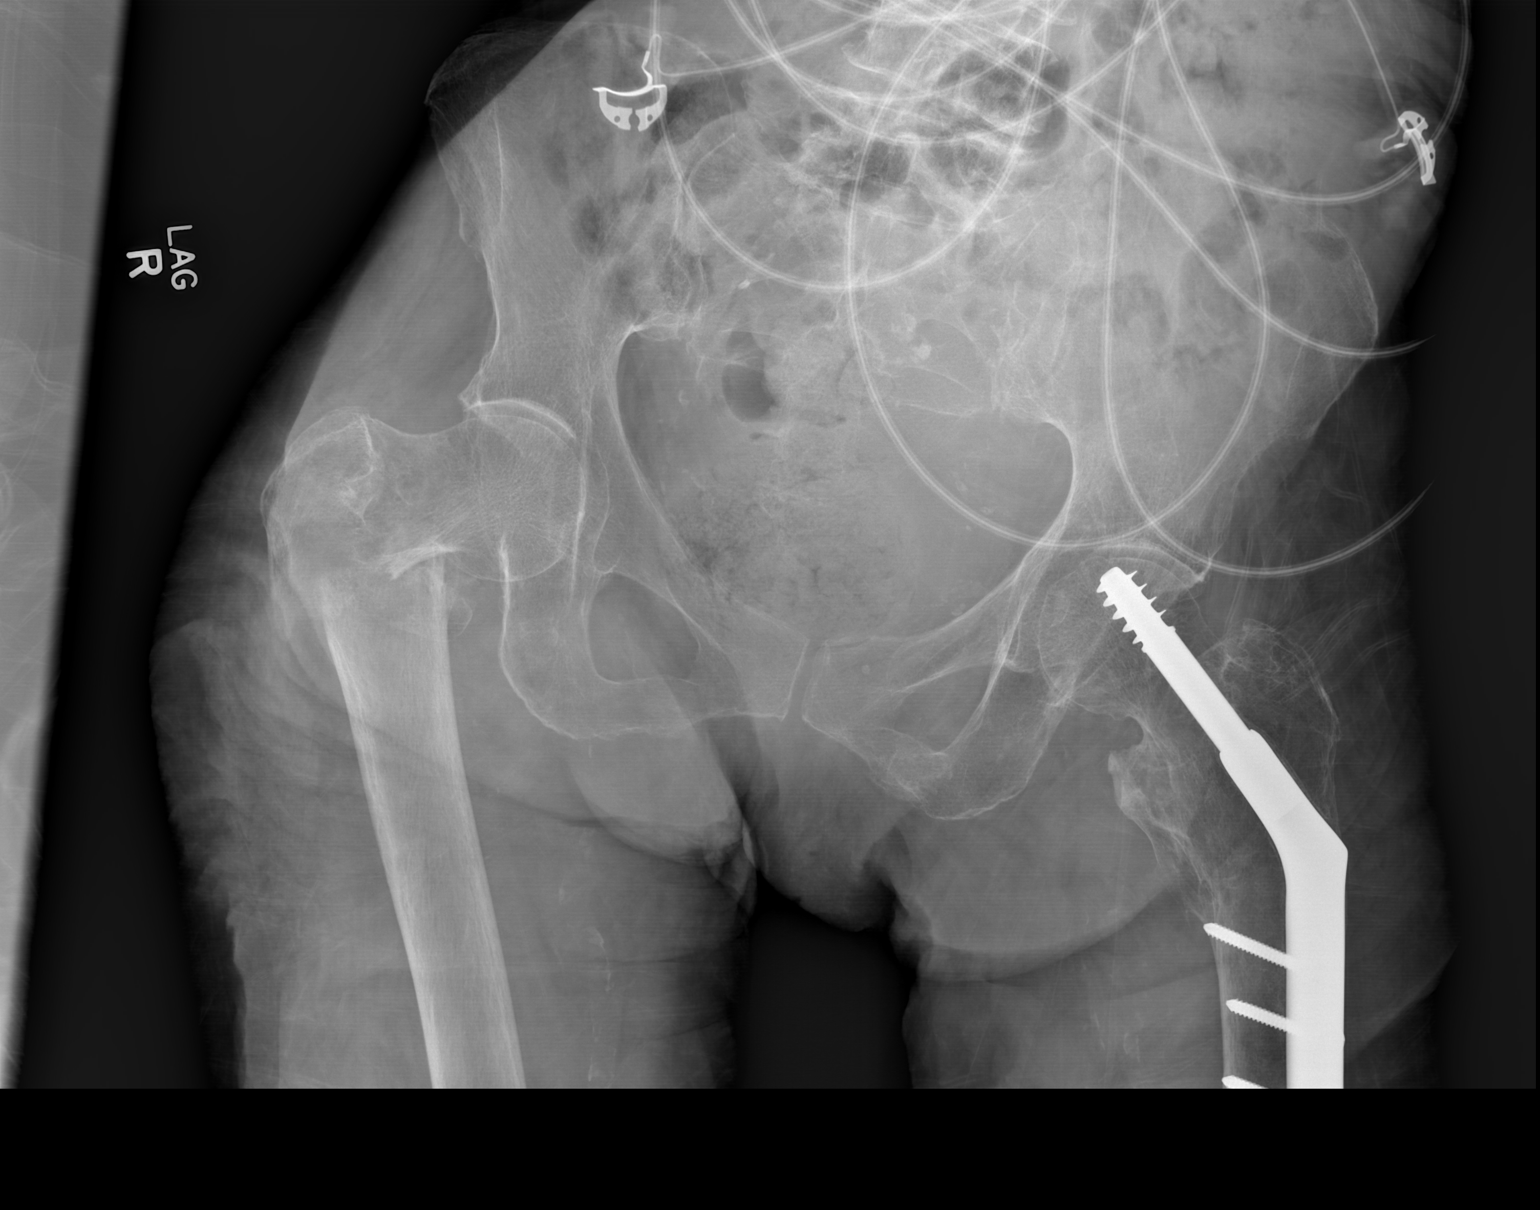

[3 of 3 positions shown; findings below may reference images not displayed]

FINDINGS: There is a mildly comminuted right femoral intertrochanteric
fracture, with overlying soft tissue swelling and medial angulation
of the distal femur.

No definite additional fractures are seen. Left femoral hardware is
grossly unremarkable in appearance, without evidence of loosening.
There is mild chronic deformity of the left inferior pubic ramus.
The sacroiliac joints are grossly unremarkable in appearance. The
visualized bowel gas pattern is unremarkable. Scattered vascular
calcifications are seen.
IMPRESSION: 1. Mildly comminuted right femoral intertrochanteric fracture, with
overlying soft tissue swelling and medial angulation of the distal
femur.
2. Scattered vascular calcifications seen.

## 2018-09-06 IMAGING — RF DG FEMUR 2+V*R*
1 series · 4 of 4 positions shown · non-contrast
Comparison: Plain films 03/10/2017.

CLINICAL DATA: Pain.  Fell.

EXAM:
RIGHT FEMUR 2 VIEWS; DG C-ARM 61-120 MIN

[Series 1: run · 4 of 4 slices shown]
[im 1/4]
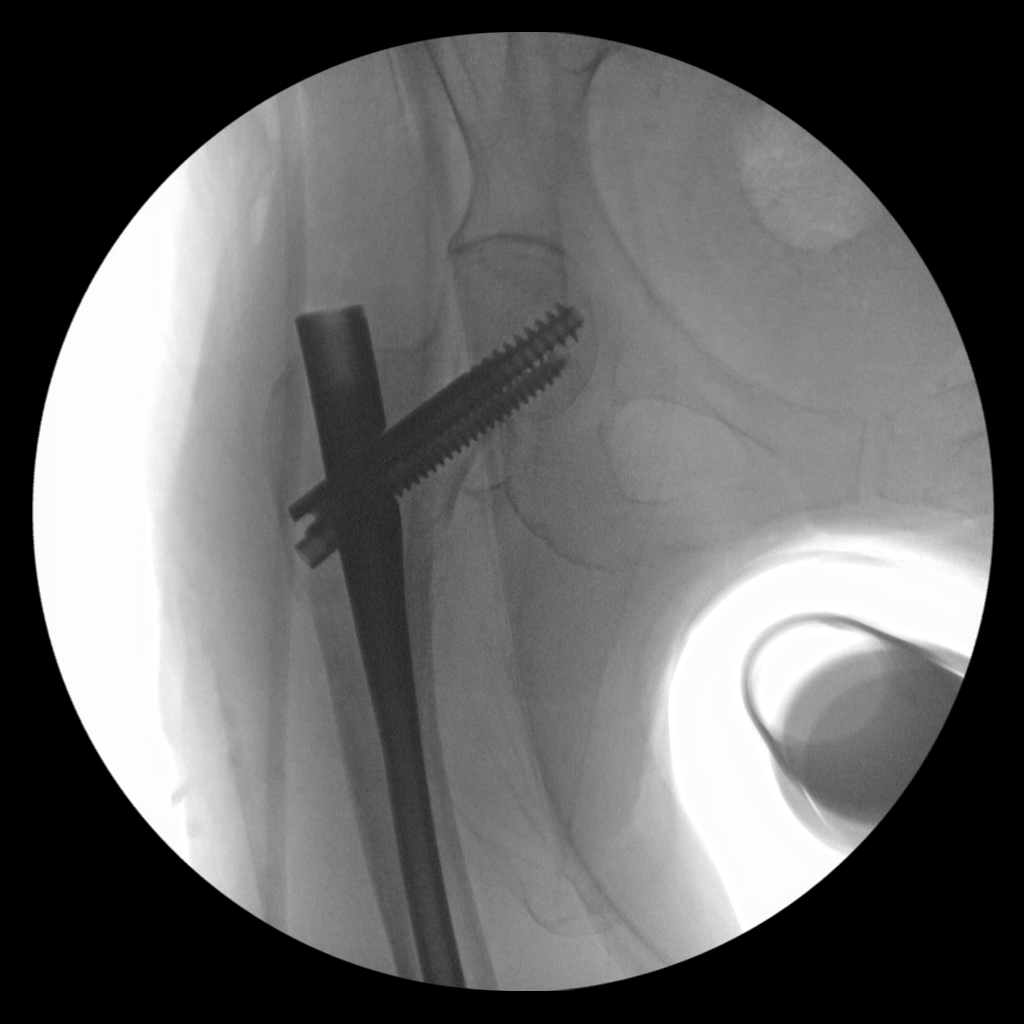
[im 2/4]
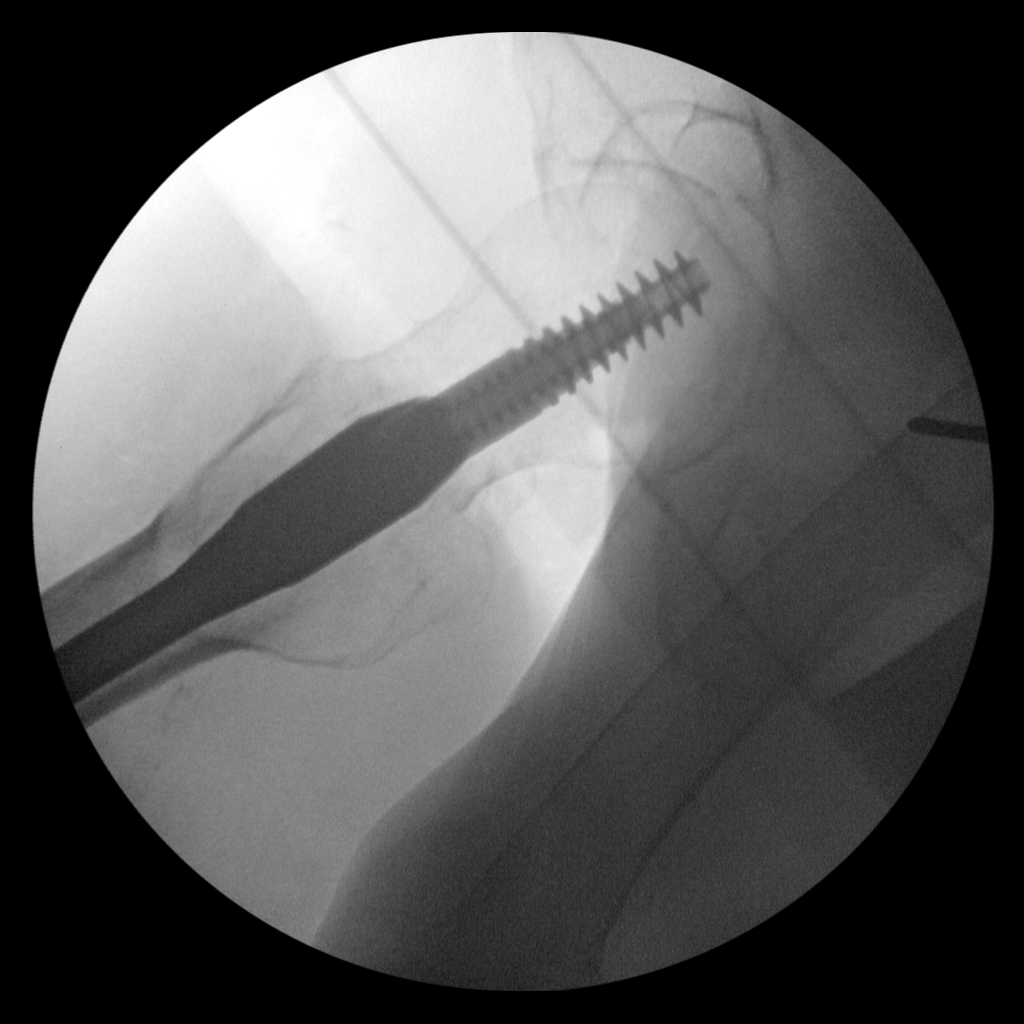
[im 3/4]
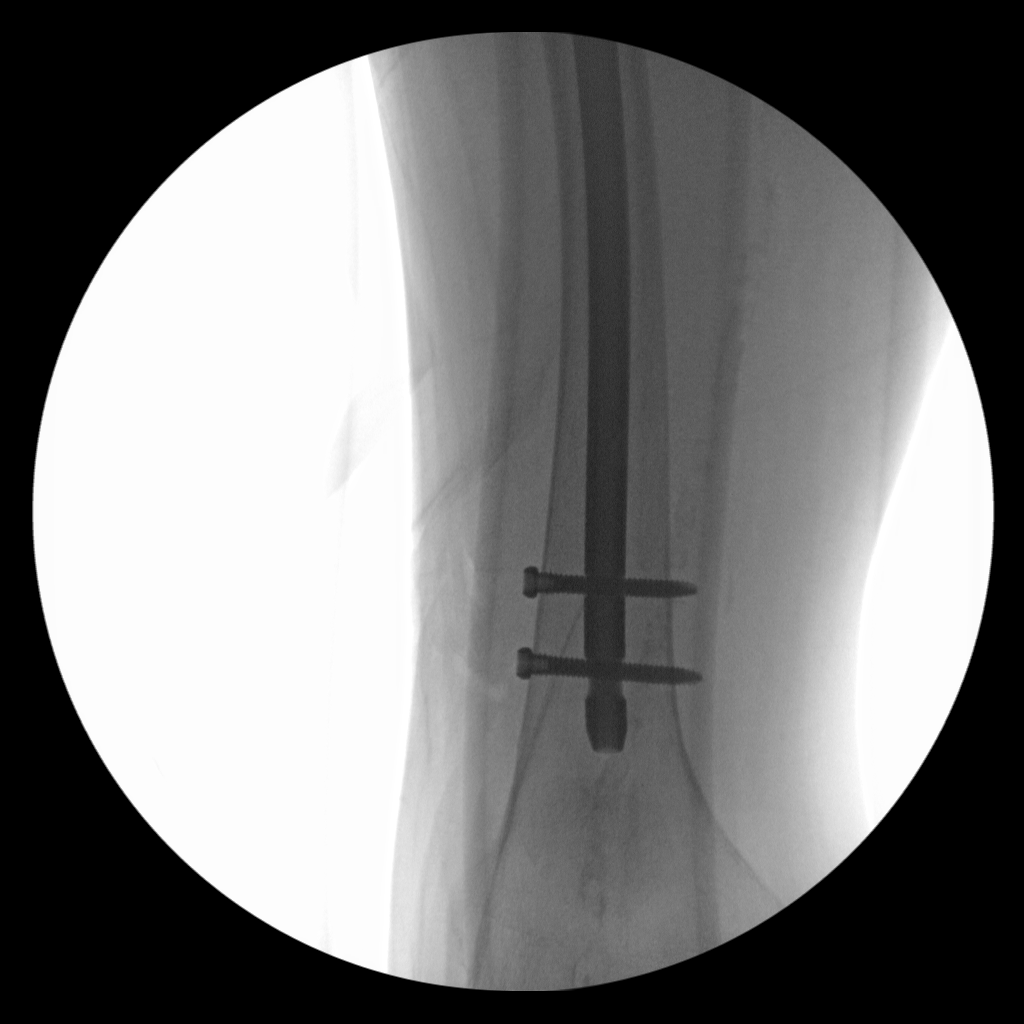
[im 4/4]
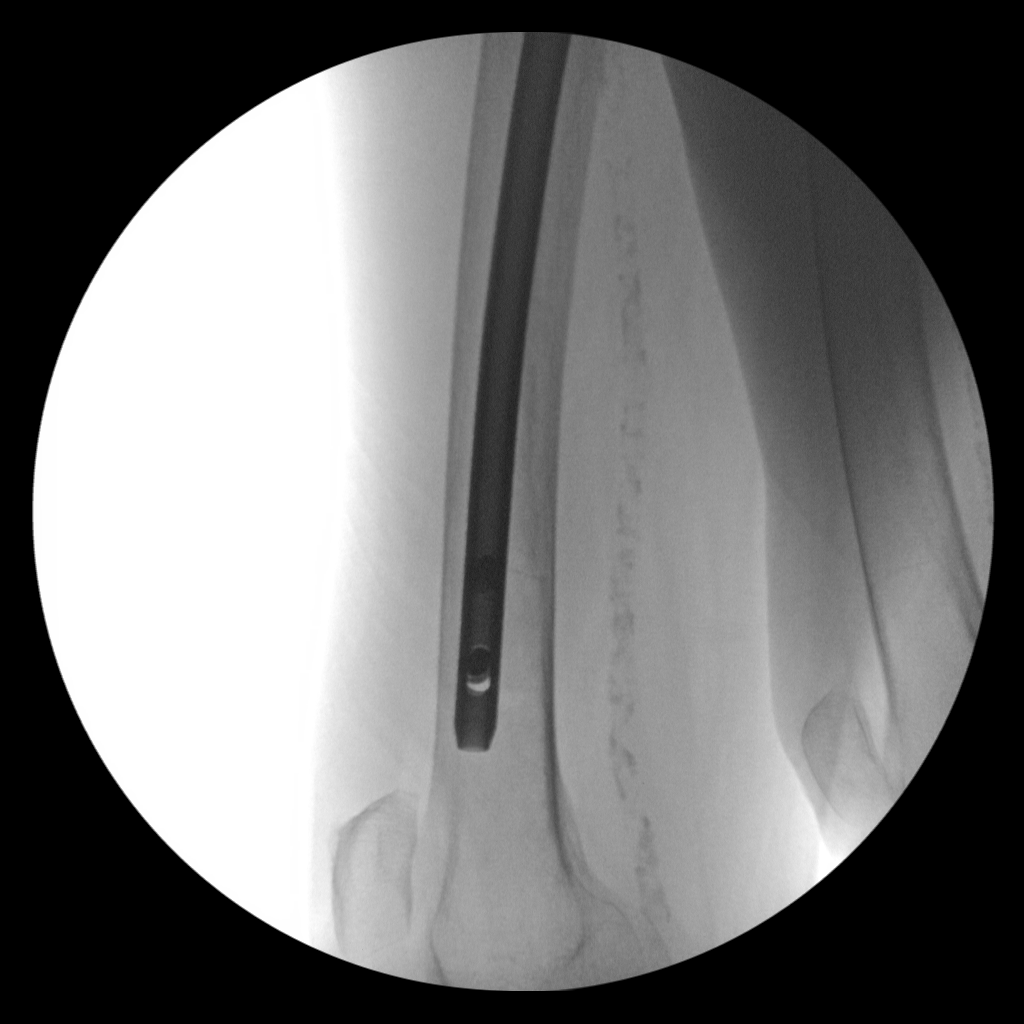

[4 of 4 positions shown; findings below may reference images not displayed]

FINDINGS: Intraoperative C-arm films documenting placement of intramedullary
rod with compression screw across a intertrochanteric fracture.
Improved position and alignment.
IMPRESSION: As above.
